# Patient Record
Sex: Female | Born: 1950 | Hispanic: No | Marital: Married | Smoking: Never smoker
Health system: Southern US, Community
[De-identification: ages and names within clinical notes are randomized; demographics above are authoritative.]

## PROBLEM LIST (undated history)

## (undated) DIAGNOSIS — M1990 Unspecified osteoarthritis, unspecified site: Secondary | ICD-10-CM

## (undated) DIAGNOSIS — H9190 Unspecified hearing loss, unspecified ear: Secondary | ICD-10-CM

## (undated) DIAGNOSIS — E785 Hyperlipidemia, unspecified: Secondary | ICD-10-CM

## (undated) DIAGNOSIS — M719 Bursopathy, unspecified: Secondary | ICD-10-CM

## (undated) HISTORY — DX: Hyperlipidemia, unspecified: E78.5

## (undated) HISTORY — PX: TUBAL LIGATION: SHX77

## (undated) HISTORY — PX: BREAST CYST ASPIRATION: SHX578

## (undated) HISTORY — DX: Bursopathy, unspecified: M71.9

## (undated) HISTORY — PX: TONSILLECTOMY: SUR1361

---

## 2011-06-16 ENCOUNTER — Ambulatory Visit (INDEPENDENT_AMBULATORY_CARE_PROVIDER_SITE_OTHER): Payer: PRIVATE HEALTH INSURANCE | Admitting: Family Medicine

## 2011-06-16 ENCOUNTER — Encounter: Payer: Self-pay | Admitting: Family Medicine

## 2011-06-16 VITALS — BP 130/80 | HR 64 | Temp 97.4°F | Ht 63.75 in | Wt 185.0 lb

## 2011-06-16 DIAGNOSIS — J309 Allergic rhinitis, unspecified: Secondary | ICD-10-CM | POA: Insufficient documentation

## 2011-06-16 DIAGNOSIS — Z2911 Encounter for prophylactic immunotherapy for respiratory syncytial virus (RSV): Secondary | ICD-10-CM

## 2011-06-16 DIAGNOSIS — Z1211 Encounter for screening for malignant neoplasm of colon: Secondary | ICD-10-CM

## 2011-06-16 DIAGNOSIS — Z1231 Encounter for screening mammogram for malignant neoplasm of breast: Secondary | ICD-10-CM

## 2011-06-16 DIAGNOSIS — Z0000 Encounter for general adult medical examination without abnormal findings: Secondary | ICD-10-CM

## 2011-06-16 DIAGNOSIS — Z23 Encounter for immunization: Secondary | ICD-10-CM

## 2011-06-16 DIAGNOSIS — Z136 Encounter for screening for cardiovascular disorders: Secondary | ICD-10-CM

## 2011-06-16 NOTE — Progress Notes (Signed)
Subjective:    Patient ID: Denise Parker, female    DOB: 04-19-1950, 61 y.o.   MRN: [id number]  HPI  61 yo here to establish care and for CPX.  Allergic rhinitis- two weeks ago, severe congestion, nasal drainage.  [facility], diagnosed with [facility] infection.  Pain improved but still had congestion and drainage.  Started on Flonase, Zyrtec and Sudafed by PMD in [place].  Feels better.  Well woman- G1P1, last pap smear was 06/2010 and normal.  Due for mammogram next month. Has never had a Zostavax. Colonoscopy approx 5 years ago (awaiting medical records).  Patient Active Problem List  Diagnoses  . Routine general medical examination at a health care facility  . Allergic rhinitis   Past Medical History  Diagnosis Date  . HLD (hyperlipidemia)    Past Surgical History  Procedure Date  . Breast biopsy   . Tonsillectomy    History  Substance Use Topics  . Smoking status: Never Smoker   . Smokeless tobacco: Not on file  . Alcohol Use: Not on file   Family History  Problem Relation Age of Onset  . Heart disease Mother   . Heart disease Father    No Known Allergies Current Outpatient Prescriptions on File Prior to Visit  Medication Sig Dispense Refill  . CALCIUM-VITAMIN D PO Take 600 mg of calcium and 800 units of vitamin D daily      . fluticasone (FLONASE) 50 MCG/ACT nasal spray Place 2 sprays into the nose daily.       The PMH, PSH, Social History, Family History, Medications, and allergies have been reviewed in [facility], and have been updated if relevant.   Review of Systems See HPI Patient reports no  vision/ hearing changes,anorexia, weight change, fever ,adenopathy, persistant / recurrent hoarseness, swallowing issues, chest pain, edema,persistant / recurrent cough, hemoptysis, dyspnea(rest, exertional, paroxysmal nocturnal), gastrointestinal  bleeding (melena, rectal bleeding), abdominal pain, excessive heart burn, GU symptoms(dysuria, hematuria, pyuria,  voiding/incontinence  Issues) syncope, focal weakness, severe memory loss, concerning skin lesions, depression, anxiety, abnormal bruising/bleeding, major joint swelling, breast masses or abnormal vaginal bleeding.       Objective:   Physical Exam BP 130/80  Pulse 64  Temp(Src) 97.4 F (36.3 C) (Oral)  Ht 5' 3.75" (1.619 m)  Wt 185 lb (83.915 kg)  BMI 32.00 kg/m2  General:  Well-developed,well-nourished,in no acute distress; alert,appropriate and cooperative throughout examination Head:  normocephalic and atraumatic.   Eyes:  vision grossly intact, pupils equal, pupils round, and pupils reactive to light.   Ears:  R ear normal and L ear normal.   Nose:  no external deformity.   Mouth:  good dentition.   Lungs:  Normal respiratory effort, chest expands symmetrically. Lungs are clear to auscultation, no crackles or wheezes. Heart:  Normal rate and regular rhythm. S1 and S2 normal without gallop, murmur, click, rub or other extra sounds. Abdomen:  Bowel sounds positive,abdomen soft and non-tender without masses, organomegaly or hernias noted. Msk:  No deformity or scoliosis noted of thoracic or lumbar spine.   Extremities:  No clubbing, cyanosis, edema, or deformity noted with normal full range of motion of all joints.   Neurologic:  alert & oriented X3 and gait normal.   Skin:  Intact without suspicious lesions or rashes Cervical Nodes:  No lymphadenopathy noted Axillary Nodes:  No palpable lymphadenopathy Psych:  Cognition and judgment appear intact. Alert and cooperative with normal attention span and concentration. No apparent delusions, illusions, hallucinations  Assessment & Plan:   1. Routine general medical examination at a health care facility  Reviewed preventive care protocols, scheduled due services, and updated immunizations Discussed nutrition, exercise, diet, and healthy lifestyle.  Comprehensive metabolic panel Lipid Panel MM Digital Screening Fecal occult  blood, imunochemical Zostavax  2. Allergic rhinitis   Improving.  Continue current meds.

## 2011-06-16 NOTE — Patient Instructions (Addendum)
It was very nice to meet you. Please stop by to see Denise Parker on your way out (after you go to the lab) to set up your mammogram. Please continue using your flonase, zyrtec and sudafed (as needed) until your symptoms improve.

## 2011-06-16 NOTE — Progress Notes (Signed)
Addended by: Eliezer Bottom on: 06/16/2011 02:57 PM   Modules accepted: Orders

## 2011-06-17 LAB — COMPREHENSIVE METABOLIC PANEL
ALT: 20 U/L (ref 0–35)
Albumin: 4.1 g/dL (ref 3.5–5.2)
Alkaline Phosphatase: 55 U/L (ref 39–117)
CO2: 27 mEq/L (ref 19–32)
Glucose, Bld: 89 mg/dL (ref 70–99)
Potassium: 3.7 mEq/L (ref 3.5–5.1)
Sodium: 141 mEq/L (ref 135–145)
Total Bilirubin: 0.4 mg/dL (ref 0.3–1.2)
Total Protein: 7.1 g/dL (ref 6.0–8.3)

## 2011-06-17 LAB — LIPID PANEL
Cholesterol: 247 mg/dL — ABNORMAL HIGH (ref 0–200)
Total CHOL/HDL Ratio: 3
VLDL: 10.6 mg/dL (ref 0.0–40.0)

## 2011-07-02 ENCOUNTER — Other Ambulatory Visit: Payer: PRIVATE HEALTH INSURANCE

## 2011-07-02 DIAGNOSIS — Z1211 Encounter for screening for malignant neoplasm of colon: Secondary | ICD-10-CM

## 2011-07-02 LAB — FECAL OCCULT BLOOD, IMMUNOCHEMICAL: Fecal Occult Bld: NEGATIVE

## 2011-07-03 ENCOUNTER — Encounter: Payer: Self-pay | Admitting: Family Medicine

## 2011-07-03 ENCOUNTER — Encounter: Payer: Self-pay | Admitting: *Deleted

## 2011-08-11 ENCOUNTER — Ambulatory Visit: Payer: Self-pay | Admitting: Family Medicine

## 2011-08-12 ENCOUNTER — Encounter: Payer: Self-pay | Admitting: Family Medicine

## 2011-08-13 ENCOUNTER — Encounter: Payer: Self-pay | Admitting: Family Medicine

## 2011-08-13 ENCOUNTER — Encounter: Payer: Self-pay | Admitting: *Deleted

## 2011-08-13 LAB — HM MAMMOGRAPHY: HM Mammogram: NORMAL

## 2011-08-25 ENCOUNTER — Ambulatory Visit (INDEPENDENT_AMBULATORY_CARE_PROVIDER_SITE_OTHER): Payer: PRIVATE HEALTH INSURANCE | Admitting: Family Medicine

## 2011-08-25 ENCOUNTER — Encounter: Payer: Self-pay | Admitting: Family Medicine

## 2011-08-25 VITALS — BP 118/68 | HR 64 | Temp 97.5°F | Wt 185.5 lb

## 2011-08-25 DIAGNOSIS — L255 Unspecified contact dermatitis due to plants, except food: Secondary | ICD-10-CM

## 2011-08-25 DIAGNOSIS — L237 Allergic contact dermatitis due to plants, except food: Secondary | ICD-10-CM

## 2011-08-25 MED ORDER — DIPHENHYDRAMINE HCL 25 MG PO CAPS: 25.0000 mg | ORAL_CAPSULE | ORAL | Status: DC | PRN

## 2011-08-25 MED ORDER — DEXAMETHASONE SODIUM PHOSPHATE 10 MG/ML IJ SOLN
10.0000 mg | Freq: Once | INTRAMUSCULAR | Status: AC
  Administered 2011-08-25: 10 mg via INTRAMUSCULAR

## 2011-08-25 MED ORDER — PREDNISONE 20 MG PO TABS: ORAL_TABLET | ORAL | Status: DC

## 2011-08-25 NOTE — Patient Instructions (Addendum)
Poison Ivy Poison ivy is a inflammation of the skin (contact dermatitis) caused by touching the allergens on the leaves of the ivy plant following previous exposure to the plant. The rash usually appears 48 hours after exposure. The rash is usually bumps (papules) or blisters (vesicles) in a linear pattern. Depending on your own sensitivity, the rash may simply cause redness and itching, or it may also progress to blisters which may break open. These must be well cared for to prevent secondary bacterial (germ) infection, followed by scarring. Keep any open areas dry, clean, dressed, and covered with an antibacterial ointment if needed. The eyes may also get puffy. The puffiness is worst in the morning and gets better as the day progresses. This dermatitis usually heals without scarring, within 2 to 3 weeks without treatment. HOME CARE INSTRUCTIONS  Thoroughly wash with soap and water as soon as you have been exposed to poison ivy. You have about one half hour to remove the plant resin before it will cause the rash. This washing will destroy the oil or antigen on the skin that is causing, or will cause, the rash. Be sure to wash under your fingernails as any plant resin there will continue to spread the rash. Do not rub skin vigorously when washing affected area. Poison ivy cannot spread if no oil from the plant remains on your body. A rash that has progressed to weeping sores will not spread the rash unless you have not washed thoroughly. It is also important to wash any clothes you have been wearing as these may carry active allergens. The rash will return if you wear the unwashed clothing, even several days later. Avoidance of the plant in the future is the best measure. Poison ivy plant can be recognized by the number of leaves. Generally, poison ivy has three leaves with flowering branches on a single stem. Diphenhydramine may be purchased over the counter and used as needed for itching. Do not drive with  this medication if it makes you drowsy.Ask your caregiver about medication for children. SEEK MEDICAL CARE IF:  Open sores develop.   Redness spreads beyond area of rash.   You notice purulent (pus-like) discharge.   You have increased pain.   Other signs of infection develop (such as fever).  Document Released: 02/08/2000 Document Revised: 01/30/2011 Document Reviewed: 12/27/2008 [employer] Patient Information 2012 [employer]. 

## 2011-08-25 NOTE — Progress Notes (Deleted)
  Subjective:    Patient ID: Denise Parker, female    DOB: 05/08/1950, 61 y.o.   MRN: [id number]  HPI    Review of Systems     Objective:   Physical Exam        Assessment & Plan:

## 2011-08-25 NOTE — Progress Notes (Signed)
  Subjective:     Denise Parker is a 62 y.o. female who complains of a rash. Symptoms began 2 days ago. Patient describes the rash as maculopapular. Characteristics of rash and associated history: Is rash pruritic?  yes. Patient's previous dermatologic history includes contact dermatitis: plants poison ivy/oak.Medications currently using: moisturizing cream. Environmental exposures or allergies: poison ivy  Patient Active Problem List  Diagnosis  . Routine general medical examination at a health care facility  . Allergic rhinitis   Past Medical History  Diagnosis Date  . HLD (hyperlipidemia)    Past Surgical History  Procedure Date  . Breast biopsy   . Tonsillectomy    History  Substance Use Topics  . Smoking status: Never Smoker   . Smokeless tobacco: Not on file  . Alcohol Use: Yes     Wine-regular   Family History  Problem Relation Age of Onset  . Heart disease Mother   . Heart disease Father    No Known Allergies Current Outpatient Prescriptions on File Prior to Visit  Medication Sig Dispense Refill  . CALCIUM-VITAMIN D PO Take 600 mg of calcium and 800 units of vitamin D daily      . CHONDROITIN SULFATE A PO Take 1200 mg's by mouth daily      . CRANBERRY-VITAMIN C PO Take by mouth daily.      . Glucosamine-Chondroit-Vit C-Mn (GLUCOSAMINE 1500 COMPLEX PO) Take by mouth daily.      . fluticasone (FLONASE) 50 MCG/ACT nasal spray Place 2 sprays into the nose daily.       No current facility-administered medications on file prior to visit.   The PMH, PSH, Social History, Family History, Medications, and allergies have been reviewed in [facility], and have been updated if relevant.   Review of Systems See HPI No fevers No pus draining from lesions None of lesions are tender.  Objective:    BP 118/68  Pulse 64  Temp 97.5 F (36.4 C) (Oral)  Wt 185 lb 8 oz (84.142 kg) Physical Exam  General:  alert and cooperative  HEENT:  PERRLA  Lymph Nodes:  Cervical,  supraclavicular, and axillary nodes normal.  Lungs:  clear to auscultation bilaterally and normal percussion bilaterally  Heart:  regular rate and rhythm, S1, S2 normal, no murmur, click, rub or gallop  Extremities:  extremities normal, atraumatic, no cyanosis or edema  Skin:   Rash located on the arms, legs.   Shape:   raised   Consistency:   nontender  Color:   hyperpigmented  Type: maculopapular eruption - arm(s) bilateral, legs bilateral  Size: severalcm    The remainder of the skin exam:  color normal, vascularity normal, no rashes or suspicious lesions, no evidence of bleeding or bruising, mobility and turgor normal     Assessment:    contact dermatitis: plants poison ivy    Plan:    1.  steroids: decadron IM in office today, given steroid taper to start on Thursday if symptoms have persisted 2.  Written patient instruction given. 3. Follow up as needed for acute illness

## 2012-09-27 ENCOUNTER — Encounter: Payer: Self-pay | Admitting: Family Medicine

## 2012-09-27 ENCOUNTER — Other Ambulatory Visit (HOSPITAL_COMMUNITY)
Admission: RE | Admit: 2012-09-27 | Discharge: 2012-09-27 | Disposition: A | Discharge status: Home / Self Care | Payer: Managed Care, Other (non HMO) | Source: Ambulatory Visit | Attending: Family Medicine | Admitting: Family Medicine

## 2012-09-27 ENCOUNTER — Ambulatory Visit (INDEPENDENT_AMBULATORY_CARE_PROVIDER_SITE_OTHER): Payer: Managed Care, Other (non HMO) | Admitting: Family Medicine

## 2012-09-27 VITALS — BP 110/70 | HR 68 | Temp 97.6°F | Ht 63.75 in | Wt 183.0 lb

## 2012-09-27 DIAGNOSIS — Z136 Encounter for screening for cardiovascular disorders: Secondary | ICD-10-CM

## 2012-09-27 DIAGNOSIS — Z01419 Encounter for gynecological examination (general) (routine) without abnormal findings: Secondary | ICD-10-CM | POA: Insufficient documentation

## 2012-09-27 DIAGNOSIS — M21611 Bunion of right foot: Secondary | ICD-10-CM

## 2012-09-27 DIAGNOSIS — M21619 Bunion of unspecified foot: Secondary | ICD-10-CM

## 2012-09-27 DIAGNOSIS — M7750 Other enthesopathy of unspecified foot: Secondary | ICD-10-CM

## 2012-09-27 DIAGNOSIS — Z1151 Encounter for screening for human papillomavirus (HPV): Secondary | ICD-10-CM | POA: Insufficient documentation

## 2012-09-27 DIAGNOSIS — Z1231 Encounter for screening mammogram for malignant neoplasm of breast: Secondary | ICD-10-CM

## 2012-09-27 DIAGNOSIS — M7741 Metatarsalgia, right foot: Secondary | ICD-10-CM

## 2012-09-27 DIAGNOSIS — M7740 Metatarsalgia, unspecified foot: Secondary | ICD-10-CM | POA: Insufficient documentation

## 2012-09-27 DIAGNOSIS — Z1211 Encounter for screening for malignant neoplasm of colon: Secondary | ICD-10-CM

## 2012-09-27 DIAGNOSIS — J309 Allergic rhinitis, unspecified: Secondary | ICD-10-CM

## 2012-09-27 DIAGNOSIS — Z0000 Encounter for general adult medical examination without abnormal findings: Secondary | ICD-10-CM

## 2012-09-27 DIAGNOSIS — R7989 Other specified abnormal findings of blood chemistry: Secondary | ICD-10-CM

## 2012-09-27 HISTORY — DX: Metatarsalgia, unspecified foot: M77.40

## 2012-09-27 LAB — COMPREHENSIVE METABOLIC PANEL
ALT: 13 U/L (ref 0–35)
Alkaline Phosphatase: 48 U/L (ref 39–117)
CO2: 27 mEq/L (ref 19–32)
Creatinine, Ser: 0.7 mg/dL (ref 0.4–1.2)
GFR: 85.39 mL/min (ref >60.00)
Glucose, Bld: 89 mg/dL (ref 70–99)
Sodium: 141 mEq/L (ref 135–145)
Total Bilirubin: 0.6 mg/dL (ref 0.3–1.2)
Total Protein: 7.1 g/dL (ref 6.0–8.3)

## 2012-09-27 LAB — LIPID PANEL: VLDL: 10 mg/dL (ref 0.0–40.0)

## 2012-09-27 NOTE — Addendum Note (Signed)
Addended by: Alvina Chou on: 09/27/2012 11:24 AM   Modules accepted: Orders

## 2012-09-27 NOTE — Patient Instructions (Addendum)
Good to see you. Please call to set up your mammogram.  Please stop by to see Shirlee Limerick on your way out to set up your podiatry referral.  We will call you with your lab results and you may also view them online.

## 2012-09-27 NOTE — Progress Notes (Signed)
Subjective:    Patient ID: Denise Parker, female    DOB: February 18, 1951, 62 y.o.   MRN: [id number]  HPI  62 yo pleasant female here for CPX.  Well woman- G1P1, last pap smear was was a few years ago, normal.  Due for mammogram. Mom had cervical CA, sister had breast CA in her 72s.  Colonoscopy UTD.  Denies blood in her stool or changes in her bowel habits.  Right foot pain- had right fib fx last year and also has a large bunion.  Since then, has pain on side and bottom of her foot.  Patient Active Problem List   Diagnosis Date Noted  . Routine general medical examination at a health care facility 06/16/2011  . Allergic rhinitis 06/16/2011   Past Medical History  Diagnosis Date  . HLD (hyperlipidemia)    Past Surgical History  Procedure Laterality Date  . Breast biopsy    . Tonsillectomy     History  Substance Use Topics  . Smoking status: Never Smoker   . Smokeless tobacco: Not on file  . Alcohol Use: Yes     Comment: Wine-regular   Family History  Problem Relation Age of Onset  . Heart disease Mother   . Heart disease Father    No Known Allergies Current Outpatient Prescriptions on File Prior to Visit  Medication Sig Dispense Refill  . CALCIUM-VITAMIN D PO Take 600 mg of calcium and 800 units of vitamin D daily      . CHONDROITIN SULFATE A PO Take 1200 mg's by mouth daily      . CRANBERRY-VITAMIN C PO Take by mouth daily.      . diphenhydrAMINE (BENADRYL) 25 mg capsule Take 1 capsule (25 mg total) by mouth every 4 (four) hours as needed for itching.  30 capsule  0  . fluticasone (FLONASE) 50 MCG/ACT nasal spray Place 2 sprays into the nose daily.      . Glucosamine-Chondroit-Vit C-Mn (GLUCOSAMINE 1500 COMPLEX PO) Take by mouth daily.       No current facility-administered medications on file prior to visit.   The PMH, PSH, Social History, Family History, Medications, and allergies have been reviewed in [facility], and have been updated if relevant.   Review of  Systems See HPI Patient reports no  vision/ hearing changes,anorexia, weight change, fever ,adenopathy, persistant / recurrent hoarseness, swallowing issues, chest pain, edema,persistant / recurrent cough, hemoptysis, dyspnea(rest, exertional, paroxysmal nocturnal), gastrointestinal  bleeding (melena, rectal bleeding), abdominal pain, excessive heart burn, GU symptoms(dysuria, hematuria, pyuria, voiding/incontinence  Issues) syncope, focal weakness, severe memory loss, concerning skin lesions, depression, anxiety, abnormal bruising/bleeding, major joint swelling, breast masses or abnormal vaginal bleeding.       Objective:   Physical Exam BP 110/70  Pulse 68  Temp(Src) 97.6 F (36.4 C)  Ht 5' 3.75" (1.619 m)  Wt 183 lb (83.008 kg)  BMI 31.67 kg/m2   General:  Well-developed,well-nourished,in no acute distress; alert,appropriate and cooperative throughout examination Head:  normocephalic and atraumatic.   Eyes:  vision grossly intact, pupils equal, pupils round, and pupils reactive to light.   Ears:  R ear normal and L ear normal.   Nose:  no external deformity.   Mouth:  good dentition.   Neck:  No deformities, masses, or tenderness noted. Breasts:  No mass, nodules, thickening, tenderness, bulging, retraction, inflamation, nipple discharge or skin changes noted.   Lungs:  Normal respiratory effort, chest expands symmetrically. Lungs are clear to auscultation, no crackles or wheezes. Heart:  Normal rate and regular rhythm. S1 and S2 normal without gallop, murmur, click, rub or other extra sounds. Abdomen:  Bowel sounds positive,abdomen soft and non-tender without masses, organomegaly or hernias noted. Rectal:  no external abnormalities.   Genitalia:  Pelvic Exam:        External: normal female genitalia without lesions or masses        Vagina: normal without lesions or masses        Cervix: normal without lesions or masses        Adnexa: normal bimanual exam without masses or  fullness        Uterus: normal by palpation        Pap smear: performed Msk:  No deformity or scoliosis noted of thoracic or lumbar spine.   Right foot, large bunion, 2nd toe curved likely due to bunion Extremities:  No clubbing, cyanosis, edema, or deformity noted with normal full range of motion of all joints.   Neurologic:  alert & oriented X3 and gait normal.   Skin:  Intact without suspicious lesions or rashes Cervical Nodes:  No lymphadenopathy noted Axillary Nodes:  No palpable lymphadenopathy Psych:  Cognition and judgment appear intact. Alert and cooperative with normal attention span and concentration. No apparent delusions, illusions, hallucinations     Assessment & Plan:    1. Routine general medical examination at a health care facility Reviewed preventive care protocols, scheduled due services, and updated immunizations Discussed nutrition, exercise, diet, and healthy lifestyle.  - Comprehensive metabolic panel - Cytology - PAP  2. Allergic rhinitis Stable, no current issues.  3. Metatarsalgia, right New- likely due to bunion, ? Also morton's neuroma - Ambulatory referral to Podiatry  4. Bunion of great toe of right foot  - Ambulatory referral to Podiatry  5. Special screening for malignant neoplasms, colon  - Fecal occult blood, imunochemical  6. Screening for ischemic heart disease  - Lipid Panel  7. Other screening mammogram  - MM Digital Screening; Future

## 2012-09-30 ENCOUNTER — Other Ambulatory Visit (INDEPENDENT_AMBULATORY_CARE_PROVIDER_SITE_OTHER): Payer: Managed Care, Other (non HMO)

## 2012-09-30 DIAGNOSIS — Z1211 Encounter for screening for malignant neoplasm of colon: Secondary | ICD-10-CM

## 2012-09-30 LAB — FECAL OCCULT BLOOD, IMMUNOCHEMICAL: Fecal Occult Bld: NEGATIVE

## 2012-10-01 ENCOUNTER — Encounter: Payer: Self-pay | Admitting: Family Medicine

## 2012-10-01 ENCOUNTER — Encounter: Payer: Self-pay | Admitting: *Deleted

## 2012-10-01 LAB — HM PAP SMEAR: HM Pap smear: NORMAL

## 2012-10-01 LAB — FECAL OCCULT BLOOD, GUAIAC: Fecal Occult Blood: NEGATIVE

## 2012-10-07 ENCOUNTER — Ambulatory Visit: Payer: Self-pay | Admitting: Family Medicine

## 2012-10-08 ENCOUNTER — Encounter: Payer: Self-pay | Admitting: Family Medicine

## 2012-10-11 ENCOUNTER — Encounter: Payer: Self-pay | Admitting: Family Medicine

## 2012-10-11 ENCOUNTER — Encounter: Payer: Self-pay | Admitting: *Deleted

## 2012-10-11 LAB — HM MAMMOGRAPHY: HM Mammogram: NORMAL

## 2013-04-13 ENCOUNTER — Ambulatory Visit (INDEPENDENT_AMBULATORY_CARE_PROVIDER_SITE_OTHER): Payer: Managed Care, Other (non HMO) | Admitting: Family Medicine

## 2013-04-13 ENCOUNTER — Encounter: Payer: Self-pay | Admitting: Family Medicine

## 2013-04-13 VITALS — BP 120/70 | HR 63 | Temp 97.4°F | Ht 63.5 in | Wt 190.2 lb

## 2013-04-13 DIAGNOSIS — B029 Zoster without complications: Secondary | ICD-10-CM

## 2013-04-13 HISTORY — DX: Zoster without complications: B02.9

## 2013-04-13 NOTE — Assessment & Plan Note (Signed)
Discussed course and tx of shingles. Agree with Valtrex- advised to finish course of tx. She is not having pain which is great- no pain rx given today. Call or return to clinic prn if these symptoms worsen or fail to improve as anticipated. The patient indicates understanding of these issues and agrees with the plan.

## 2013-04-13 NOTE — Progress Notes (Signed)
Pre-visit discussion using our clinic review tool. No additional management support is needed unless otherwise documented below in the visit note.  

## 2013-04-13 NOTE — Progress Notes (Signed)
   Subjective:   Patient ID: Denise Parker, female    DOB: 1950-05-15, 63 y.o.   MRN: [id number]  Denise Parker is a pleasant 63 y.o. year old female who presents to clinic today with Rash  on 04/13/2013  HPI: Lives and works in [place] for much of the year.  Felt something itching and burning on her neck 4 days ago.  Son looked at it and said she had four little bumps.  Went to her PMD in [place] and was diagnosed with Shingles.  Started on Valtrex 1 gm three times daily x 7 days.  Rash already feels better.  Has never had any pain.  Here today to confirm this diagnosis and to discuss further tx if necessary.  Patient Active Problem List   Diagnosis Date Noted  . Herpes zoster 04/13/2013  . Metatarsalgia 09/27/2012  . Bunion of great toe of right foot 09/27/2012  . Routine general medical examination at a health care facility 06/16/2011  . Allergic rhinitis 06/16/2011   Past Medical History  Diagnosis Date  . HLD (hyperlipidemia)    Past Surgical History  Procedure Laterality Date  . Breast biopsy    . Tonsillectomy     History  Substance Use Topics  . Smoking status: Never Smoker   . Smokeless tobacco: Not on file  . Alcohol Use: Yes     Comment: Wine-regular   Family History  Problem Relation Age of Onset  . Heart disease Mother   . Heart disease Father    No Known Allergies Current Outpatient Prescriptions on File Prior to Visit  Medication Sig Dispense Refill  . CALCIUM-VITAMIN D PO Take 600 mg of calcium and 800 units of vitamin D daily      . CHONDROITIN SULFATE A PO Take 1200 mg's by mouth daily      . CRANBERRY-VITAMIN C PO Take by mouth daily.      . Glucosamine-Chondroit-Vit C-Mn (GLUCOSAMINE 1500 COMPLEX PO) Take by mouth daily.      . vitamin E 400 UNIT capsule Take 400 Units by mouth daily.       No current facility-administered medications on file prior to visit.   The PMH, PSH, Social History, Family History, Medications, and allergies [facility]  [facility], and have been updated if relevant.   Review of Systems See HPI    Objective:    BP 120/70  Pulse 63  Temp(Src) 97.4 F (36.3 C) (Oral)  Ht 5' 3.5" (1.613 m)  Wt 190 lb 4 oz (86.297 kg)  BMI 33.17 kg/m2  SpO2 98%   Physical Exam  Nursing note and vitals reviewed. Constitutional: She appears well-developed and well-nourished. No distress.  Skin:             Assessment & Plan:   Herpes zoster No Follow-up on file.

## 2013-12-20 ENCOUNTER — Encounter: Payer: Managed Care, Other (non HMO) | Admitting: Family Medicine

## 2013-12-28 ENCOUNTER — Encounter: Payer: Managed Care, Other (non HMO) | Admitting: Family Medicine

## 2014-01-24 ENCOUNTER — Ambulatory Visit (INDEPENDENT_AMBULATORY_CARE_PROVIDER_SITE_OTHER): Payer: Medicare Other | Admitting: Family Medicine

## 2014-01-24 ENCOUNTER — Other Ambulatory Visit (HOSPITAL_COMMUNITY)
Admission: RE | Admit: 2014-01-24 | Discharge: 2014-01-24 | Disposition: A | Discharge status: Home / Self Care | Payer: Managed Care, Other (non HMO) | Source: Ambulatory Visit | Attending: Family Medicine | Admitting: Family Medicine

## 2014-01-24 ENCOUNTER — Encounter: Payer: Self-pay | Admitting: Family Medicine

## 2014-01-24 VITALS — BP 110/60 | HR 83 | Temp 97.9°F | Ht 64.0 in | Wt 195.0 lb

## 2014-01-24 DIAGNOSIS — Z1151 Encounter for screening for human papillomavirus (HPV): Secondary | ICD-10-CM | POA: Diagnosis not present

## 2014-01-24 DIAGNOSIS — Z0000 Encounter for general adult medical examination without abnormal findings: Secondary | ICD-10-CM | POA: Diagnosis not present

## 2014-01-24 DIAGNOSIS — Z1231 Encounter for screening mammogram for malignant neoplasm of breast: Secondary | ICD-10-CM

## 2014-01-24 DIAGNOSIS — Z79899 Other long term (current) drug therapy: Secondary | ICD-10-CM

## 2014-01-24 DIAGNOSIS — E785 Hyperlipidemia, unspecified: Secondary | ICD-10-CM | POA: Diagnosis not present

## 2014-01-24 DIAGNOSIS — Z01419 Encounter for gynecological examination (general) (routine) without abnormal findings: Secondary | ICD-10-CM | POA: Insufficient documentation

## 2014-01-24 DIAGNOSIS — Z1239 Encounter for other screening for malignant neoplasm of breast: Secondary | ICD-10-CM

## 2014-01-24 DIAGNOSIS — Z23 Encounter for immunization: Secondary | ICD-10-CM | POA: Diagnosis not present

## 2014-01-24 LAB — CBC WITH DIFFERENTIAL/PLATELET
BASOS ABS: 0.1 10*3/uL (ref 0.0–0.1)
Basophils Relative: 0.9 % (ref 0.0–3.0)
EOS PCT: 1.5 % (ref 0.0–5.0)
Eosinophils Absolute: 0.1 10*3/uL (ref 0.0–0.7)
HEMATOCRIT: 40.5 % (ref 36.0–46.0)
HEMOGLOBIN: 13.1 g/dL (ref 12.0–15.0)
LYMPHS ABS: 2.2 10*3/uL (ref 0.7–4.0)
Lymphocytes Relative: 38.5 % (ref 12.0–46.0)
MCHC: 32.4 g/dL (ref 30.0–36.0)
MCV: 85.7 fl (ref 78.0–100.0)
MONOS PCT: 7.7 % (ref 3.0–12.0)
Monocytes Absolute: 0.4 10*3/uL (ref 0.1–1.0)
NEUTROS ABS: 2.9 10*3/uL (ref 1.4–7.7)
Neutrophils Relative %: 51.4 % (ref 43.0–77.0)
Platelets: 242 10*3/uL (ref 150.0–400.0)
RBC: 4.73 Mil/uL (ref 3.87–5.11)
RDW: 14.8 % (ref 11.5–15.5)
WBC: 5.6 10*3/uL (ref 4.0–10.5)

## 2014-01-24 LAB — TSH: TSH: 2.31 u[IU]/mL (ref 0.35–4.50)

## 2014-01-24 NOTE — Addendum Note (Signed)
Addended by: Desmond DikeKNIGHT, Arbell Wycoff H on: 01/24/2014 10:00 AM   Modules accepted: Orders

## 2014-01-24 NOTE — Progress Notes (Signed)
Subjective:    Patient ID: Denise Parker, female    DOB: [birth year], 63 y.o.   MRN: [id number]  HPI  63 yo pleasant female here for Welcome to Medicare Physical.  I have personally reviewed the Medicare Annual Wellness questionnaire and have noted 1. The patient's medical and social history 2. Their use of alcohol, tobacco or illicit drugs 3. Their current medications and supplements 4. The patient's functional ability including ADL's, fall risks, home safety risks and hearing or visual             impairment. 5. Diet and physical activities 6. Evidence for depression or mood disorders  End of life wishes discussed and updated in Social History.  The roster of all physicians providing medical care to patient - is listed in the Snapshot section of the chart.   Last pap smear - 09/27/12- done by me. No post menopausal bleeding. Mammogram 10/07/12. Mom had cervical CA, sister had breast CA in her 6660s. Eye exam 05/2013 Influenza vaccine 12/08/13  Colonoscopy UTD- done in [place], per pt, done in 2007.  Denies blood in her stool or changes in her bowel habits.  Lab Results  Component Value Date   CHOL 246* 09/27/2012   HDL 69.50 09/27/2012   LDLDIRECT 171.8 09/27/2012   TRIG 50.0 09/27/2012   CHOLHDL 4 09/27/2012   Lab Results  Component Value Date   NA 141 09/27/2012   K 3.8 09/27/2012   CL 105 09/27/2012   CO2 27 09/27/2012   Lab Results  Component Value Date   CREATININE 0.7 09/27/2012     Patient Active Problem List   Diagnosis Date Noted  . Welcome to Medicare preventive visit 01/24/2014  . Herpes zoster 04/13/2013  . Metatarsalgia 09/27/2012  . Bunion of great toe of right foot 09/27/2012  . Allergic rhinitis 06/16/2011   Past Medical History  Diagnosis Date  . HLD (hyperlipidemia)    Past Surgical History  Procedure Laterality Date  . Breast biopsy    . Tonsillectomy     History  Substance Use Topics  . Smoking status: Never Smoker   .  Smokeless tobacco: Not on file  . Alcohol Use: Yes     Comment: Wine-regular   Family History  Problem Relation Age of Onset  . Heart disease Mother   . Heart disease Father    No Known Allergies Current Outpatient Prescriptions on File Prior to Visit  Medication Sig Dispense Refill  . CALCIUM-VITAMIN D PO Take 600 mg of calcium and 800 units of vitamin D daily    . CHONDROITIN SULFATE A PO Take 1200 mg's by mouth daily    . CRANBERRY-VITAMIN C PO Take by mouth daily.    . Glucosamine-Chondroit-Vit C-Mn (GLUCOSAMINE 1500 COMPLEX PO) Take by mouth daily.    . valACYclovir (VALTREX) 1000 MG tablet Take 1,000 mg by mouth 4 (four) times daily.    . vitamin E 400 UNIT capsule Take 400 Units by mouth daily.     No current facility-administered medications on file prior to visit.   The PMH, PSH, Social History, Family History, Medications, and allergies [facility], and have been updated if relevant.   Review of Systems See HPI Patient reports no  vision/ hearing changes,anorexia, weight change, fever ,adenopathy, persistant / recurrent hoarseness, swallowing issues, chest pain, edema,persistant / recurrent cough, hemoptysis, dyspnea(rest, exertional, paroxysmal nocturnal), gastrointestinal  bleeding (melena, rectal bleeding), abdominal pain, excessive heart burn, GU symptoms(dysuria, hematuria, pyuria, voiding/incontinence  Issues)  syncope, focal weakness, severe memory loss, concerning skin lesions, depression, anxiety, abnormal bruising/bleeding, major joint swelling, breast masses or abnormal vaginal bleeding.       Objective:   Physical Exam BP 110/60 mmHg  Pulse 83  Temp(Src) 97.9 F (36.6 C) (Oral)  Ht 5\' 4"  (1.626 m)  Wt 195 lb (88.451 kg)  BMI 33.46 kg/m2  SpO2 98%   General:  Well-developed,well-nourished,in no acute distress; alert,appropriate and cooperative throughout examination Head:  normocephalic and atraumatic.   Eyes:  vision grossly intact, pupils  equal, pupils round, and pupils reactive to light.   Ears:  R ear normal and L ear normal.   Nose:  no external deformity.   Mouth:  good dentition.   Neck:  No deformities, masses, or tenderness noted. Breasts:  No mass, nodules, thickening, tenderness, bulging, retraction, inflamation, nipple discharge or skin changes noted.   Lungs:  Normal respiratory effort, chest expands symmetrically. Lungs are clear to auscultation, no crackles or wheezes. Heart:  Normal rate and regular rhythm. S1 and S2 normal without gallop, murmur, click, rub or other extra sounds. Abdomen:  Bowel sounds positive,abdomen soft and non-tender without masses, organomegaly or hernias noted. Rectal:  no external abnormalities.   Genitalia:  Pelvic Exam:        External: normal female genitalia without lesions or masses        Vagina: normal without lesions or masses        Cervix: normal without lesions or masses        Adnexa: normal bimanual exam without masses or fullness        Uterus: normal by palpation        Pap smear: performed Msk:  No deformity or scoliosis noted of thoracic or lumbar spine.   Extremities:  No clubbing, cyanosis, edema, or deformity noted with normal full range of motion of all joints.   Neurologic:  alert & oriented X3 and gait normal.   Skin:  Intact without suspicious lesions or rashes Cervical Nodes:  No lymphadenopathy noted Axillary Nodes:  No palpable lymphadenopathy Psych:  Cognition and judgment appear intact. Alert and cooperative with normal attention span and concentration. No apparent delusions, illusions, hallucinations      Assessment & Plan:

## 2014-01-24 NOTE — Patient Instructions (Signed)
Great to see you. Happy Holidays.  Please schedule your mammogram at your convenience.

## 2014-01-24 NOTE — Addendum Note (Signed)
Addended by: Dianne DunARON, Silvie Obremski M on: 01/24/2014 10:01 AM   Modules accepted: Orders, SmartSet

## 2014-01-24 NOTE — Addendum Note (Signed)
Addended by: Javonni Macke, TALIDianne DunA M on: 01/24/2014 09:55 AM   Modules accepted: Kipp BroodSmartSet

## 2014-01-24 NOTE — Assessment & Plan Note (Addendum)
The patients weight, height, BMI and visual acuity have been recorded in the chart I have made referrals, counseling and provided education to the patient based review of the above and I have provided the pt with a written personalized care plan for preventive services.  Pap smear done today given family h/o cervical CA.  Prevnar 13 given to pt today. Mammogram ordered.  EKG NSR (mild bradycardia)  Orders Placed This Encounter  Procedures  . MM Digital Screening  . Pneumococcal conjugate vaccine 13-valent IM  . CBC with Differential  . Comprehensive metabolic panel  . Lipid panel  . TSH  . EKG 12-Lead

## 2014-01-24 NOTE — Progress Notes (Signed)
Pre visit review using our clinic review tool, if applicable. No additional management support is needed unless otherwise documented below in the visit note. 

## 2014-01-25 LAB — LIPID PANEL
CHOLESTEROL: 286 mg/dL — AB (ref 0–200)
HDL: 68.5 mg/dL (ref >39.00)
LDL Cholesterol: 201 mg/dL — ABNORMAL HIGH (ref 0–99)
NonHDL: 217.5
Total CHOL/HDL Ratio: 4
Triglycerides: 85 mg/dL (ref 0.0–149.0)
VLDL: 17 mg/dL (ref 0.0–40.0)

## 2014-01-25 LAB — COMPREHENSIVE METABOLIC PANEL
ALT: 29 U/L (ref 0–35)
AST: 28 U/L (ref 0–37)
Albumin: 4.1 g/dL (ref 3.5–5.2)
Alkaline Phosphatase: 55 U/L (ref 39–117)
BILIRUBIN TOTAL: 0.8 mg/dL (ref 0.2–1.2)
BUN: 14 mg/dL (ref 6–23)
CO2: 24 mEq/L (ref 19–32)
CREATININE: 0.7 mg/dL (ref 0.4–1.2)
Calcium: 8.7 mg/dL (ref 8.4–10.5)
Chloride: 104 mEq/L (ref 96–112)
GFR: 86.4 mL/min (ref >60.00)
GLUCOSE: 85 mg/dL (ref 70–99)
Potassium: 4 mEq/L (ref 3.5–5.1)
Sodium: 137 mEq/L (ref 135–145)
Total Protein: 7 g/dL (ref 6.0–8.3)

## 2014-01-25 LAB — CYTOLOGY - PAP

## 2014-01-26 ENCOUNTER — Other Ambulatory Visit: Payer: Self-pay | Admitting: Family Medicine

## 2014-01-26 MED ORDER — ATORVASTATIN CALCIUM 20 MG PO TABS
20.0000 mg | ORAL_TABLET | Freq: Every day | ORAL | Status: DC
Start: 2014-01-26 — End: 2015-01-01

## 2014-02-21 ENCOUNTER — Ambulatory Visit: Payer: Self-pay | Admitting: Family Medicine

## 2014-02-21 DIAGNOSIS — Z1231 Encounter for screening mammogram for malignant neoplasm of breast: Secondary | ICD-10-CM | POA: Diagnosis not present

## 2014-02-22 ENCOUNTER — Other Ambulatory Visit: Payer: Self-pay | Admitting: Internal Medicine

## 2014-02-22 ENCOUNTER — Encounter: Payer: Self-pay | Admitting: Family Medicine

## 2014-02-22 DIAGNOSIS — R928 Other abnormal and inconclusive findings on diagnostic imaging of breast: Secondary | ICD-10-CM

## 2014-03-14 ENCOUNTER — Ambulatory Visit: Payer: Self-pay | Admitting: Family Medicine

## 2014-03-14 DIAGNOSIS — R928 Other abnormal and inconclusive findings on diagnostic imaging of breast: Secondary | ICD-10-CM | POA: Diagnosis not present

## 2014-03-15 ENCOUNTER — Encounter: Payer: Self-pay | Admitting: Family Medicine

## 2014-03-21 ENCOUNTER — Ambulatory Visit (INDEPENDENT_AMBULATORY_CARE_PROVIDER_SITE_OTHER)
Admission: RE | Admit: 2014-03-21 | Discharge: 2014-03-21 | Disposition: A | Discharge status: Home / Self Care | Payer: Medicare Other | Source: Ambulatory Visit | Attending: Family Medicine | Admitting: Family Medicine

## 2014-03-21 ENCOUNTER — Encounter: Payer: Self-pay | Admitting: Family Medicine

## 2014-03-21 ENCOUNTER — Ambulatory Visit (INDEPENDENT_AMBULATORY_CARE_PROVIDER_SITE_OTHER): Payer: Medicare Other | Admitting: Family Medicine

## 2014-03-21 VITALS — BP 108/64 | HR 64 | Temp 97.7°F | Wt 199.5 lb

## 2014-03-21 DIAGNOSIS — M79672 Pain in left foot: Secondary | ICD-10-CM

## 2014-03-21 NOTE — Patient Instructions (Signed)
Heel Spur A heel spur is a hook of bone that can form on the calcaneus (the heel bone and the largest bone of the foot). Heel spurs are often associated with plantar fasciitis and usually come in people who have had the problem for an extended period of time. The cause of the relationship is unknown. The pain associated with them is thought to be caused by an inflammation (soreness and redness) of the plantar fascia rather than the spur itself. The plantar fascia is a thick fibrous like tissue that runs from the calcaneus (heel bone) to the ball of the foot. This strong, tight tissue helps maintain the arch of your foot. It helps distribute the weight across your foot as you walk or run. Stresses placed on the plantar fascia can be tremendous. When it is inflamed normal activities become painful. Pain is worse in the morning after sleeping. After sleeping the plantar fascia is tight. The first movements stretch the fascia and this causes pain. As the tendon loosens, the pain usually gets better. It often returns with too much standing or walking.  About 70% of patients with plantar fasciitis have a heel spur. About half of people without foot pain also have heel spurs. DIAGNOSIS  The diagnosis of a heel spur is made by X-ray. The X-ray shows a hook of bone protruding from the bottom of the calcaneus at the point where the plantar fascia is attached to the heel bone.  TREATMENT  It is necessary to find out what is causing the stretching of the plantar fascia. If the cause is over-pronation (flat feet), orthotics and proper foot ware may help.  Stretching exercises, losing weight, wearing shoes that have a cushioned heel that absorbs shock, and elevating the heel with the use of a heel cradle, heel cup, or orthotics may all help. Heel cradles and heel cups provide extra comfort and cushion to the heel, and reduce the amount of shock to the sore area. AVOIDING THE PAIN OF PLANTAR FASCIITIS AND HEEL  SPURS  Consult a sports medicine professional before beginning a new exercise program.  Walking programs offer a good workout. There is a lower chance of overuse injuries common to the runners. There is less impact and less jarring of the joints.  Begin all new exercise programs slowly. If problems or pains develop, decrease the amount of time or distance until you are at a comfortable level.  Wear good shoes and replace them regularly.  Stretch your foot and the heel cords at the back of the ankle (Achilles tendons) both before and after exercise.  Run or exercise on even surfaces that are not hard. For example, asphalt is better than pavement.  Do not run barefoot on hard surfaces.  If using a treadmill, vary the incline.  Do not continue to workout if you have foot or joint problems. Seek professional help if they do not improve. HOME CARE INSTRUCTIONS   Avoid activities that cause you pain until you recover.  Use ice or cold packs to the problem or painful areas after working out.  Only take over-the-counter or prescription medicines for pain, discomfort, or fever as directed by your caregiver.  Soft shoe inserts or athletic shoes with air or gel sole cushions may be helpful.  If problems continue or become more severe, consult a sports medicine caregiver. Cortisone is a potent anti-inflammatory medication that may be injected into the painful area. You can discuss this treatment with your caregiver. MAKE SURE YOU:     Understand these instructions.  Will watch your condition.  Will get help right away if you are not doing well or get worse. Document Released: 03/19/2005 Document Revised: 05/05/2011 Document Reviewed: 04/13/2013 Exit[employer] Patient Information 2015 [employer]. This information is not intended to replace advice given to you by your health care provider. Make sure you discuss any questions you have with your health care provider.  

## 2014-03-21 NOTE — Progress Notes (Signed)
Subjective:   Patient ID: Denise Parker, female    DOB: 1950/07/10, 64 y.o.   MRN: [id number]  Denise Parker is a pleasant 64 y.o. year old female who presents to clinic today with Foot Pain  on 03/21/2014  HPI:  Left heel pain for past several months.  She has tried changing foot wear, getting inserts which are not helping much.  Pain is in the morning but actually worse pain is after standing or walking long periods of time. Sometimes radiates up back of heel towards achilles. No swelling. No known injury. NSAIDS do help.  Walking on harder surfaces now too.  Moved their bedroom down into the basement.  Current Outpatient Prescriptions on File Prior to Visit  Medication Sig Dispense Refill  . atorvastatin (LIPITOR) 20 MG tablet Take 1 tablet (20 mg total) by mouth daily. 90 tablet 3  . CALCIUM-VITAMIN D PO Take 600 mg of calcium and 800 units of vitamin D daily    . CHONDROITIN SULFATE A PO Take 1200 mg's by mouth daily    . CRANBERRY-VITAMIN C PO Take by mouth daily.    . Glucosamine-Chondroit-Vit C-Mn (GLUCOSAMINE 1500 COMPLEX PO) Take by mouth daily.    . vitamin E 400 UNIT capsule Take 400 Units by mouth daily.    . valACYclovir (VALTREX) 1000 MG tablet Take 1,000 mg by mouth 4 (four) times daily.     No current facility-administered medications on file prior to visit.    No Known Allergies  Past Medical History  Diagnosis Date  . HLD (hyperlipidemia)     Past Surgical History  Procedure Laterality Date  . Breast biopsy    . Tonsillectomy      Family History  Problem Relation Age of Onset  . Heart disease Mother   . Heart disease Father     History   Social History  . Marital Status: Married    Spouse Name: N/A    Number of Children: N/A  . Years of Education: N/A   Occupational History  . Not on file.   Social History Main Topics  . Smoking status: Never Smoker   . Smokeless tobacco: Never Used  . Alcohol Use: 0.0 oz/week    0 Not  specified per week     Comment: Wine-regular  . Drug Use: No  . Sexual Activity: Not on file   Other Topics Concern  . Not on file   Social History Narrative   Commuting from [place] to [place] every two weeks.   Speech therapist, works with children.   Has one 54 yo son in [place] and 4 grandchildren.   Does not have a living will or HPOA   Desires CPR, would not want prolonged life support if futile.      The PMH, PSH, Social History, Family History, Medications, and allergies have been reviewed in [facility], and have been updated if relevant.   Review of Systems  Constitutional: Negative.   Musculoskeletal: Negative for gait problem.  Skin: Negative.   Neurological: Negative.   Psychiatric/Behavioral: Negative.   All other systems reviewed and are negative.      Objective:    BP 108/64 mmHg  Pulse 64  Temp(Src) 97.7 F (36.5 C) (Oral)  Wt 199 lb 8 oz (90.493 kg)   Physical Exam  Constitutional: She is oriented to person, place, and time. She appears well-developed.  HENT:  Head: Normocephalic.  Eyes: Conjunctivae are normal.  Neck: Normal range of motion.  Cardiovascular:  Normal rate.   Pulmonary/Chest: Effort normal.  Musculoskeletal: Normal range of motion.  Left heel TTP, neg laxity  Neurological: She is alert and oriented to person, place, and time. No cranial nerve deficit.  Skin: Skin is dry.  Psychiatric: She has a normal mood and affect. Her behavior is normal. Judgment and thought content normal.          Assessment & Plan:   Pain of left heel - Plan: DG Foot Complete Left No Follow-up on file.

## 2014-03-21 NOTE — Progress Notes (Signed)
Pre visit review using our clinic review tool, if applicable. No additional management support is needed unless otherwise documented below in the visit note. 

## 2014-03-21 NOTE — Assessment & Plan Note (Signed)
?   Heel spur with plantar fascitis. Xray today, given exercises from sports med advisor. NSAIDs advised with food- as directed. Call or return to clinic prn if these symptoms worsen or fail to improve as anticipated. The patient indicates understanding of these issues and agrees with the plan.

## 2014-03-23 ENCOUNTER — Other Ambulatory Visit: Payer: Self-pay | Admitting: Family Medicine

## 2014-03-23 DIAGNOSIS — M79672 Pain in left foot: Secondary | ICD-10-CM

## 2014-03-29 ENCOUNTER — Other Ambulatory Visit: Payer: Self-pay | Admitting: Family Medicine

## 2014-03-29 ENCOUNTER — Ambulatory Visit (INDEPENDENT_AMBULATORY_CARE_PROVIDER_SITE_OTHER): Payer: Medicare Other | Admitting: Podiatry

## 2014-03-29 ENCOUNTER — Encounter: Payer: Self-pay | Admitting: Podiatry

## 2014-03-29 VITALS — BP 139/73 | HR 69 | Resp 12

## 2014-03-29 DIAGNOSIS — E785 Hyperlipidemia, unspecified: Secondary | ICD-10-CM

## 2014-03-29 DIAGNOSIS — M722 Plantar fascial fibromatosis: Secondary | ICD-10-CM | POA: Diagnosis not present

## 2014-03-29 MED ORDER — MELOXICAM 15 MG PO TABS: 15.0000 mg | ORAL_TABLET | Freq: Every day | ORAL | Status: DC

## 2014-03-29 MED ORDER — METHYLPREDNISOLONE (PAK) 4 MG PO TABS: ORAL_TABLET | ORAL | Status: DC

## 2014-03-29 NOTE — Progress Notes (Signed)
   Subjective:    Patient ID: Denise Parker, female    DOB: 28-Nov-1950, 64 y.o.   MRN: [id number]  HPI  PT STATED LT FOOT HEEL HAVE SPUR DIAGNOSED BY DR. Clifton CustardAARON 1 MONTH. THE HEEL IS GETTING WORSE ESPECIALLY WHEN PUTTING PRESSURE ON IT. TRIED IBUPROFEN, OTC INSERTS IT HELP SOME.  Review of Systems  Constitutional: Positive for activity change.  Musculoskeletal: Positive for gait problem.  All other systems reviewed and are negative.      Objective:   Physical Exam: I have reviewed her past medical history medications allergies 30 social history and review of systems. Pulses are strongly palpable bilateral. Neurologic sensorium is intact percent lasting monofilament. Deep tendon reflexes are intact. Muscle strength is 5 over 5 dorsiflexion plantar flexors and inverters everters onto the musculature is intact. Orthopedic evaluation to restrict all joints distal to the ankle level for range of motion without crepitation. Pain on palpation medial calcaneal tubercle of the left heel. Radiographic evaluation demonstrates plantar and posterior calcaneal heel spurs with soft tissue increase in density of plantar fascial calcaneal insertion site. Indicative of plantar fasciitis.        Assessment & Plan:  Assessment: Plantar fasciitis left.  Plan: Discussed etiology pathology conservative versus surgical therapies. At this point we injected her left heel today with Kenalog and local anesthetic. She tolerated this procedure well. I prescribed a Medrol Dosepak to be followed by meloxicam. We discussed appropriate shoe gear stretching exercises ice therapy and shoe gear modifications. We discussed the etiology pathology conservative versus surgical therapies we also placed her in plantar fascial brace and a night splint. I will follow up with her in 1 month.

## 2014-03-31 ENCOUNTER — Other Ambulatory Visit (INDEPENDENT_AMBULATORY_CARE_PROVIDER_SITE_OTHER): Payer: Medicare Other

## 2014-03-31 DIAGNOSIS — E785 Hyperlipidemia, unspecified: Secondary | ICD-10-CM

## 2014-03-31 LAB — COMPREHENSIVE METABOLIC PANEL
ALT: 15 U/L (ref 0–35)
AST: 15 U/L (ref 0–37)
Albumin: 3.9 g/dL (ref 3.5–5.2)
Alkaline Phosphatase: 55 U/L (ref 39–117)
BUN: 13 mg/dL (ref 6–23)
CO2: 29 meq/L (ref 19–32)
Calcium: 9 mg/dL (ref 8.4–10.5)
Chloride: 106 mEq/L (ref 96–112)
Creatinine, Ser: 0.68 mg/dL (ref 0.40–1.20)
GFR: 92.24 mL/min (ref >60.00)
GLUCOSE: 96 mg/dL (ref 70–99)
Potassium: 4.4 mEq/L (ref 3.5–5.1)
Sodium: 140 mEq/L (ref 135–145)
TOTAL PROTEIN: 6.9 g/dL (ref 6.0–8.3)
Total Bilirubin: 0.6 mg/dL (ref 0.2–1.2)

## 2014-03-31 LAB — LIPID PANEL
CHOL/HDL RATIO: 3
Cholesterol: 205 mg/dL — ABNORMAL HIGH (ref 0–200)
HDL: 76.1 mg/dL (ref >39.00)
LDL Cholesterol: 115 mg/dL — ABNORMAL HIGH (ref 0–99)
NonHDL: 128.9
Triglycerides: 70 mg/dL (ref 0.0–149.0)
VLDL: 14 mg/dL (ref 0.0–40.0)

## 2014-04-07 ENCOUNTER — Other Ambulatory Visit: Payer: Managed Care, Other (non HMO)

## 2014-04-26 ENCOUNTER — Encounter: Payer: Self-pay | Admitting: Podiatry

## 2014-04-26 ENCOUNTER — Ambulatory Visit (INDEPENDENT_AMBULATORY_CARE_PROVIDER_SITE_OTHER): Payer: Medicare Other | Admitting: Podiatry

## 2014-04-26 VITALS — BP 112/68 | HR 65 | Resp 16

## 2014-04-26 DIAGNOSIS — M722 Plantar fascial fibromatosis: Secondary | ICD-10-CM | POA: Diagnosis not present

## 2014-04-27 NOTE — Progress Notes (Signed)
She presents today for follow-up of her plantar fasciitis of her left heel. She states there has been some improvement but is not well yet. She states that she is unable to wear the night splint during the evening through the night. She is able to wear while sitting watching television or reading.  Objective: Vital signs are stable she is alert and oriented 3. Pulses are strongly palpable. She has pain on palpation medial tubercle of the left heel.  Assessment: Chronic intractable plantar fasciitis left heel.  Plan: Injected left heel today and encouraged her to continue all conservative therapies including night splints a splint soaking icing shoe gear changes and medications. Follow up with her in 1 month

## 2014-05-31 ENCOUNTER — Ambulatory Visit: Payer: Medicare Other | Admitting: Podiatry

## 2014-06-12 ENCOUNTER — Encounter: Payer: Self-pay | Admitting: Podiatry

## 2014-06-12 ENCOUNTER — Ambulatory Visit (INDEPENDENT_AMBULATORY_CARE_PROVIDER_SITE_OTHER): Payer: Medicare Other | Admitting: Podiatry

## 2014-06-12 VITALS — BP 132/78 | HR 61 | Resp 16 | Ht 64.0 in | Wt 192.0 lb

## 2014-06-12 DIAGNOSIS — M722 Plantar fascial fibromatosis: Secondary | ICD-10-CM

## 2014-06-12 NOTE — Progress Notes (Signed)
She presents today for follow-up of plantar fasciitis to her left foot. She denies fever chills nausea vomiting muscle aches and pains.  Objective: Pulses are strongly palpable bilateral. Neurologic sensorium is intact per Semmes-Weinstein monofilament. Deep tendon reflexes are intact bilateral and muscle strength is 5 over 5 dorsiflexion plantar flexors and inverters everters on his musculature is intact. Orthopedic evaluation and a straight solid joints distal to the ankle full range of motion without crepitation. Pain on palpation medial calcaneal tubercle of the left heel.  Assessment: Plantar fasciitis left.  Plan: We injected her left heel today with Kenalog and local anesthetic encouraged her to continue all conservative therapies.

## 2014-07-26 ENCOUNTER — Ambulatory Visit (INDEPENDENT_AMBULATORY_CARE_PROVIDER_SITE_OTHER): Payer: Medicare Other | Admitting: Podiatry

## 2014-07-26 ENCOUNTER — Encounter: Payer: Self-pay | Admitting: Podiatry

## 2014-07-26 VITALS — BP 134/81 | HR 63 | Temp 97.3°F | Resp 16

## 2014-07-26 DIAGNOSIS — M722 Plantar fascial fibromatosis: Secondary | ICD-10-CM

## 2014-07-26 NOTE — Progress Notes (Signed)
She presents today for follow-up of plantar fasciitis to her left heel. States that it is intractable and continues to be problematic for her. She denies any changes in her past medical history medications or allergies.  Objective: Pulses are palpable left. She has pain on palpation medial continue tubercle of her left heel.  Assessment: Plantar fasciitis intractable nature left. Lateral calcaneal tubercle  Plan: Injected left heel today with prolonged local and aesthetic. She was also scanned for set of orthotics.

## 2014-08-23 ENCOUNTER — Encounter: Payer: Self-pay | Admitting: Podiatry

## 2014-08-23 ENCOUNTER — Ambulatory Visit (INDEPENDENT_AMBULATORY_CARE_PROVIDER_SITE_OTHER): Payer: Medicare Other | Admitting: Podiatry

## 2014-08-23 VITALS — BP 132/72 | HR 57 | Resp 16

## 2014-08-23 DIAGNOSIS — M722 Plantar fascial fibromatosis: Secondary | ICD-10-CM | POA: Diagnosis not present

## 2014-08-24 NOTE — Progress Notes (Signed)
She presents today for follow-up of her plantar fasciitis left. She states this seems the cortisone injection is not lasting as long.  Objective: Vital signs are stable she is alert and oriented 3 she has minimal pain on palpation medial calcaneal tubercle left. No calf pain and pulses remain palpable.  Assessment: Intractable plantar fasciitis left foot.  Plan: Currently regarding continue with conservative therapies and start the use of her orthotics. At this point this should help alleviate her symptoms. I'll follow-up with her in 1 month

## 2014-09-06 DIAGNOSIS — H2513 Age-related nuclear cataract, bilateral: Secondary | ICD-10-CM | POA: Diagnosis not present

## 2014-09-07 ENCOUNTER — Encounter: Payer: Self-pay | Admitting: Primary Care

## 2014-09-07 ENCOUNTER — Ambulatory Visit (INDEPENDENT_AMBULATORY_CARE_PROVIDER_SITE_OTHER): Payer: Medicare Other | Admitting: Primary Care

## 2014-09-07 VITALS — BP 132/78 | HR 104 | Temp 98.6°F | Ht 64.0 in | Wt 198.4 lb

## 2014-09-07 DIAGNOSIS — H9202 Otalgia, left ear: Secondary | ICD-10-CM

## 2014-09-07 MED ORDER — NEOMYCIN-POLYMYXIN-HC 3.5-10000-1 OT SOLN: 3.0000 [drp] | Freq: Three times a day (TID) | OTIC | Status: DC

## 2014-09-07 NOTE — Progress Notes (Signed)
Subjective:    Patient ID: Denise Parker, female    DOB: [birth year], 64 y.o.   MRN: [id number]  HPI  Denise Parker is a 64 year old female who presents today with a chief complaint of ear pain and itching. Her pain is present to the left ear. She first noticed it this morning and has been intermittent with "sharp" pain. Overall she's feeling slightly better. She has not taken any medication for her pain. Denies fevers, chills, nausea, body aches, ear fullness.  Review of Systems  Constitutional: Negative for fever and chills.  HENT: Positive for ear pain. Negative for congestion, postnasal drip, rhinorrhea, sinus pressure and sore throat.        Ear itching  Respiratory: Negative for cough and shortness of breath.   Cardiovascular: Negative for chest pain.  Musculoskeletal: Negative for myalgias.       Past Medical History  Diagnosis Date  . HLD (hyperlipidemia)     History   Social History  . Marital Status: Married    Spouse Name: N/A  . Number of Children: N/A  . Years of Education: N/A   Occupational History  . Not on file.   Social History Main Topics  . Smoking status: Never Smoker   . Smokeless tobacco: Never Used  . Alcohol Use: 0.0 oz/week    0 Standard drinks or equivalent per week     Comment: Wine-regular  . Drug Use: No  . Sexual Activity: Not on file   Other Topics Concern  . Not on file   Social History Narrative   Commuting from [place] to [place] every two weeks.   Speech therapist, works with children.   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   Does not have a living will or HPOA   Desires CPR, would not want prolonged life support if futile.       Past Surgical History  Procedure Laterality Date  . Breast biopsy    . Tonsillectomy      Family History  Problem Relation Age of Onset  . Heart disease Mother   . Heart disease Father     No Known Allergies  Current Outpatient Prescriptions on File Prior to Visit  Medication  Sig Dispense Refill  . atorvastatin (LIPITOR) 20 MG tablet Take 1 tablet (20 mg total) by mouth daily. 90 tablet 3  . CALCIUM-VITAMIN D PO Take 600 mg of calcium and 800 units of vitamin D daily    . CHONDROITIN SULFATE A PO Take 1200 mg's by mouth daily    . CRANBERRY-VITAMIN C PO Take by mouth daily.    . Glucosamine-Chondroit-Vit C-Mn (GLUCOSAMINE 1500 COMPLEX PO) Take by mouth daily.    . vitamin E 400 UNIT capsule Take 400 Units by mouth daily.     No current facility-administered medications on file prior to visit.    BP 132/78 mmHg  Pulse 104  Temp(Src) 98.6 F (37 C) (Oral)  Ht  (1.626 m)  Wt 198 lb 6.4 oz (89.994 kg)  BMI 34.04 kg/m2  SpO2 94%    Objective:   Physical Exam  HENT:  Right Ear: Tympanic membrane and ear canal normal. Tympanic membrane is not erythematous. No middle ear effusion.  Left Ear: Tympanic membrane and ear canal normal. Tympanic membrane is not erythematous.  No middle ear effusion.  Nose: Nose normal.  Mouth/Throat: Oropharynx is clear and moist.  Eyes: Conjunctivae are normal. Pupils are equal, round, and reactive to  light.  Neck: Neck supple.  Cardiovascular: Normal rate and regular rhythm.   Pulmonary/Chest: Effort normal and breath sounds normal.  Lymphadenopathy:    She has no cervical adenopathy.  Skin: Skin is warm and dry.          Assessment & Plan:  Ear pain and itching:  Present since this morning. Feeling better during office visit. TM's unremarkable bilaterally without effusion or erythema. Canals slightly red, no infection noted. Supportive measures: oral antihistamine for itching, ibuprofen for pain. [place], so RX printed for cortisporin drops if canals become irritated. She will fill only if needed. Follow up PRN

## 2014-09-07 NOTE — Progress Notes (Signed)
Pre visit review using our clinic review tool, if applicable. No additional management support is needed unless otherwise documented below in the visit note. 

## 2014-09-07 NOTE — Patient Instructions (Signed)
Start taking Zyrtec for the ear itching. You may take ibuprofen for pain.  Fill the drops if your pain becomes worse in 3 days or if you develop fevers.  It was nice meeting you!

## 2014-10-04 ENCOUNTER — Ambulatory Visit: Payer: Managed Care, Other (non HMO) | Admitting: Podiatry

## 2014-10-26 ENCOUNTER — Encounter: Payer: Self-pay | Admitting: Family Medicine

## 2014-10-26 ENCOUNTER — Ambulatory Visit (INDEPENDENT_AMBULATORY_CARE_PROVIDER_SITE_OTHER): Payer: Medicare Other | Admitting: Family Medicine

## 2014-10-26 VITALS — BP 104/62 | HR 65 | Temp 98.0°F | Wt 203.5 lb

## 2014-10-26 DIAGNOSIS — H9202 Otalgia, left ear: Secondary | ICD-10-CM | POA: Diagnosis not present

## 2014-10-26 MED ORDER — AMOXICILLIN-POT CLAVULANATE 875-125 MG PO TABS: 1.0000 | ORAL_TABLET | Freq: Two times a day (BID) | ORAL | Status: AC

## 2014-10-26 MED ORDER — NEOMYCIN-POLYMYXIN-HC 3.5-10000-1 OT SOLN: 3.0000 [drp] | Freq: Three times a day (TID) | OTIC | Status: DC

## 2014-10-26 NOTE — Progress Notes (Signed)
SUBJECTIVE: Denise Parker is a 64 y.o. female  with 3 day(s) history of pain of right ear, and coryza, congestion, sore throat, productive cough and bilateral sinus pain. Temperature not measured at home.   OBJECTIVE:  BP 104/62 mmHg  Pulse 65  Temp(Src) 98 F (36.7 C) (Oral)  Wt 203 lb 8 oz (92.307 kg)  SpO2 99% General appearance: alert, well appearing, and in no distress and oriented to person, place, and time.   Ears: left ear normal, right TM red, dull, bulging Nose: mucosal erythema Oropharynx: mucous membranes moist, pharynx normal without lesions Neck: supple, no significant adenopathy Lungs: clear to auscultation, no wheezes, rales or rhonchi, symmetric air entry  Current Outpatient Prescriptions on File Prior to Visit  Medication Sig Dispense Refill  . atorvastatin (LIPITOR) 20 MG tablet Take 1 tablet (20 mg total) by mouth daily. 90 tablet 3  . CALCIUM-VITAMIN D PO Take 600 mg of calcium and 800 units of vitamin D daily    . CHONDROITIN SULFATE A PO Take 1200 mg's by mouth daily    . CRANBERRY-VITAMIN C PO Take by mouth daily.    . Glucosamine-Chondroit-Vit C-Mn (GLUCOSAMINE 1500 COMPLEX PO) Take by mouth daily.    Marland Kitchen ibuprofen (ADVIL,MOTRIN) 400 MG tablet Take 400 mg by mouth daily.    . vitamin E 400 UNIT capsule Take 400 Units by mouth daily.     No current facility-administered medications on file prior to visit.    No Known Allergies  Past Medical History  Diagnosis Date  . HLD (hyperlipidemia)     Past Surgical History  Procedure Laterality Date  . Breast biopsy    . Tonsillectomy      Family History  Problem Relation Age of Onset  . Heart disease Mother   . Heart disease Father     Social History   Social History  . Marital Status: Married    Spouse Name: N/A  . Number of Children: N/A  . Years of Education: N/A   Occupational History  . Not on file.   Social History Main Topics  . Smoking status: Never Smoker   . Smokeless  tobacco: Never Used  . Alcohol Use: 0.0 oz/week    0 Standard drinks or equivalent per week     Comment: Wine-regular  . Drug Use: No  . Sexual Activity: Not on file   Other Topics Concern  . Not on file   Social History Narrative   Commuting from [place] to [place] every two weeks.   Speech therapist, works with children.   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   Does not have a living will or HPOA   Desires CPR, would not want prolonged life support if futile.      The PMH, PSH, Social History, Family History, Medications, and allergies have been reviewed in [facility], and have been updated if relevant.  ASSESSMENT: Otitis Media  PLAN: 1) See orders for this visit as documented in the electronic medical record- Augmentin 1 tablet twice daily x 10 days. 2) Symptomatic therapy suggested: use ibuprofen, antihistamine-decongestant of choice prn.  3) Call or return to clinic prn if these symptoms worsen or fail to improve as anticipated.

## 2014-10-26 NOTE — Progress Notes (Signed)
Pre visit review using our clinic review tool, if applicable. No additional management support is needed unless otherwise documented below in the visit note. 

## 2014-10-26 NOTE — Patient Instructions (Signed)
Great to see you. Please take Augmentin as directed- 1 tablet twice daily for 10 days. Ok to continue zyrtec and your ear drops.  Please keep me updated.

## 2015-01-01 ENCOUNTER — Other Ambulatory Visit: Payer: Self-pay | Admitting: Family Medicine

## 2015-06-13 ENCOUNTER — Ambulatory Visit (INDEPENDENT_AMBULATORY_CARE_PROVIDER_SITE_OTHER): Payer: Medicare Other

## 2015-06-13 VITALS — BP 120/70 | HR 63 | Temp 98.3°F | Ht 64.0 in | Wt 206.5 lb

## 2015-06-13 DIAGNOSIS — Z0000 Encounter for general adult medical examination without abnormal findings: Secondary | ICD-10-CM | POA: Diagnosis not present

## 2015-06-13 DIAGNOSIS — E7800 Pure hypercholesterolemia, unspecified: Secondary | ICD-10-CM

## 2015-06-13 DIAGNOSIS — Z1159 Encounter for screening for other viral diseases: Secondary | ICD-10-CM

## 2015-06-13 DIAGNOSIS — E669 Obesity, unspecified: Secondary | ICD-10-CM

## 2015-06-13 NOTE — Patient Instructions (Signed)
Ms. Lequita HaltMorgan , Thank you for taking time to come for your Medicare Wellness Visit. I appreciate your ongoing commitment to your health goals. Please review the following plan we discussed and let me know if I can assist you in the future.   These are the goals we discussed: Goals    . Increase physical activity     Starting 06/13/2015, I will continue to exercise for at least 60 min daily in an effort to lose weight.        This is a list of the screening recommended for you and due dates:  Health Maintenance  Topic Date Due  . Mammogram  06/20/2015*  . DEXA scan (bone density measurement)  06/20/2015*  .  Hepatitis C: One time screening is recommended by Center for Disease Control  (CDC) for  adults born from [birth year] through 1967.   06/20/2015*  . Flu Shot  09/25/2015  . Pneumonia vaccines (2 of 2 - PPSV23) 11/23/2017  . Colon Cancer Screening  01/24/2018  . Tetanus Vaccine  01/24/2021  . Shingles Vaccine  Completed  *Topic was postponed. The date shown is not the original due date.   Preventive Care for Adults  A healthy lifestyle and preventive care can promote health and wellness. Preventive health guidelines for adults include the following key practices.  . A routine yearly physical is a good way to check with your health care provider about your health and preventive screening. It is a chance to share any concerns and updates on your health and to receive a thorough exam.  . Visit your dentist for a routine exam and preventive care every 6 months. Brush your teeth twice a day and floss once a day. Good oral hygiene prevents tooth decay and gum disease.  . The frequency of eye exams is based on your age, health, family medical history, use  of contact lenses, and other factors. Follow your health care provider's ecommendations for frequency of eye exams.  . Eat a healthy diet. Foods like vegetables, fruits, whole grains, low-fat dairy products, and lean protein foods contain the  nutrients you need without too many calories. Decrease your intake of foods high in solid fats, added sugars, and salt. Eat the right amount of calories for you. Get information about a proper diet from your health care provider, if necessary.  . Regular physical exercise is one of the most important things you can do for your health. Most adults should get at least 150 minutes of moderate-intensity exercise (any activity that increases your heart rate and causes you to sweat) each week. In addition, most adults need muscle-strengthening exercises on 2 or more days a week.  Silver Sneakers may be a benefit available to you. To determine eligibility, you may visit the website: www.silversneakers.com or contact program at [phone] Mon-Fri between 8AM-8PM.   . Maintain a healthy weight. The body mass index (BMI) is a screening tool to identify possible weight problems. It provides an estimate of body fat based on height and weight. Your health care provider can find your BMI and can help you achieve or maintain a healthy weight.   For adults 20 years and older: ? A BMI below 18.5 is considered underweight. ? A BMI of 18.5 to 24.9 is normal. ? A BMI of 25 to 29.9 is considered overweight. ? A BMI of 30 and above is considered obese.   . Maintain normal blood lipids and cholesterol levels by exercising and minimizing your intake of saturated  fat. Eat a balanced diet with plenty of fruit and vegetables. Blood tests for lipids and cholesterol should begin at age 58 and be repeated every 5 years. If your lipid or cholesterol levels are high, you are over 50, or you are at high risk for heart disease, you may need your cholesterol levels checked more frequently. Ongoing high lipid and cholesterol levels should be treated with medicines if diet and exercise are not working.  . If you smoke, find out from your health care provider how to quit. If you do not use tobacco, please do not start.  . If you  choose to drink alcohol, please do not consume more than 2 drinks per day. One drink is considered to be 12 ounces (355 mL) of beer, 5 ounces (148 mL) of wine, or 1.5 ounces (44 mL) of liquor.  . If you are 75-48 years old, ask your health care provider if you should take aspirin to prevent strokes.  . Use sunscreen. Apply sunscreen liberally and repeatedly throughout the day. You should seek shade when your shadow is shorter than you. Protect yourself by wearing long sleeves, pants, a wide-brimmed hat, and sunglasses year round, whenever you are outdoors.  . Once a month, do a whole body skin exam, using a mirror to look at the skin on your back. Tell your health care provider of new moles, moles that have irregular borders, moles that are larger than a pencil eraser, or moles that have changed in shape or color.

## 2015-06-13 NOTE — Progress Notes (Signed)
Pre visit review using our clinic review tool, if applicable. No additional management support is needed unless otherwise documented below in the visit note. 

## 2015-06-13 NOTE — Progress Notes (Signed)
Subjective:   Denise Parker is a 65 y.o. female who presents for Medicare Annual (Subsequent) preventive examination.  Cardiac Risk Factors include: advanced age (>69men, >75 women);obesity (BMI >30kg/m2)     Objective:     Vitals: BP 120/70 mmHg  Pulse 63  Temp(Src) 98.3 F (36.8 C) (Oral)  Ht  (1.626 m)  Wt 206 lb 8 oz (93.668 kg)  BMI 35.43 kg/m2  SpO2 98%  Body mass index is 35.43 kg/(m^2).   Tobacco History  Smoking status  . Never Smoker   Smokeless tobacco  . Never Used     Counseling given: No   Past Medical History  Diagnosis Date  . HLD (hyperlipidemia)    Past Surgical History  Procedure Laterality Date  . Breast biopsy    . Tonsillectomy     Family History  Problem Relation Age of Onset  . Heart disease Mother   . Heart disease Father    History  Sexual Activity  . Sexual Activity: Yes    Outpatient Encounter Prescriptions as of 06/13/2015  Medication Sig  . atorvastatin (LIPITOR) 20 MG tablet TAKE 1 TABLET BY MOUTH EVERY DAY  . CALCIUM-VITAMIN D PO Take 600 mg of calcium and 800 units of vitamin D daily  . cetirizine (ZYRTEC) 5 MG tablet Take 5 mg by mouth daily.  Marland Kitchen CRANBERRY-VITAMIN C PO Take by mouth daily.  . Flaxseed, Linseed, (FLAXSEED OIL PO) Take 1 capsule by mouth.  . fluticasone (FLONASE) 50 MCG/ACT nasal spray Place 1 spray into both nostrils daily.  . Glucosamine-Chondroit-Vit C-Mn (GLUCOSAMINE 1500 COMPLEX PO) Take by mouth daily.  . Turmeric 500 MG TABS Take 500 mg by mouth.  . vitamin C (ASCORBIC ACID) 250 MG tablet Take 250 mg by mouth daily.  . vitamin E 400 UNIT capsule Take 400 Units by mouth daily.  . [DISCONTINUED] CHONDROITIN SULFATE A PO Take 1200 mg's by mouth daily  . [DISCONTINUED] ibuprofen (ADVIL,MOTRIN) 400 MG tablet Take 400 mg by mouth daily.  . [DISCONTINUED] neomycin-polymyxin-hydrocortisone (CORTISPORIN) otic solution Place 3 drops into the left ear 3 (three) times daily.   No  facility-administered encounter medications on file as of 06/13/2015.    Activities of Daily Living In your present state of health, do you have any difficulty performing the following activities: 06/13/2015  Hearing? N  Vision? N  Difficulty concentrating or making decisions? N  Walking or climbing stairs? N  Dressing or bathing? N  Doing errands, shopping? N  Preparing Food and eating ? N  Using the Toilet? N  In the past six months, have you accidently leaked urine? N  Do you have problems with loss of bowel control? N  Managing your Medications? N  Managing your Finances? N  Housekeeping or managing your Housekeeping? N    Patient Care Team: Dianne Dun, MD as PCP - General (Family Medicine) Lemar Lofty, MD as Consulting Physician (Unknown Physician Specialty)    Assessment:     Hearing Screening           Right ear:   40 0 40 0   Left ear:   0 0 40 0   Vision Screening Comments: Last eye exam in October 2016   Exercise Activities and Dietary recommendations Current Exercise Habits: Structured exercise class;Home exercise routine, Type of exercise: walking;Other - see comments (water aerobics), Time (Minutes): 60, Frequency (Times/Week): 7, Weekly Exercise (Minutes/Week): 420, Intensity: Moderate, Exercise limited by: None identified  Goals    .  Increase physical activity     Starting 06/13/2015, I will continue to exercise for at least 60 min daily in an effort to lose weight.       Fall Risk Fall Risk  06/13/2015 01/24/2014  Falls in the past year? No -  Number falls in past yr: - 1   Depression Screen PHQ 2/9 Scores 06/13/2015 01/24/2014  PHQ - 2 Score 0 0     Cognitive Testing MMSE - Mini Mental State Exam 06/13/2015  Orientation to time 5  Orientation to Place 5  Registration 3  Attention/ Calculation 0  Recall 3  Language- name 2 objects 0  Language- repeat 1  Language- follow 3 step command 3  Language- read &  follow direction 0  Write a sentence 0  Copy design 0  Total score 20   PLEASE NOTE: A Mini-Cog screen was completed. Maximum score is 20. A value of 0 denotes this part of Folstein MMSE was not completed.  Orientation to Time - Max 5 Orientation to Place - Max 5 Registration - Max 3 Recall - Max 3 Language Repeat - Max 1 Language Follow 3 Step Command - Max 3   Immunization History  Administered Date(s) Administered  . Influenza-Unspecified 12/08/2013  . Pneumococcal Conjugate-13 01/24/2014  . Pneumococcal-Unspecified 11/23/2012  . Zoster 06/16/2011   Screening Tests Health Maintenance  Topic Date Due  . MAMMOGRAM  06/20/2015 (Originally 03/15/2015)  . DEXA SCAN  06/20/2015 (Originally 01/21/2014)  . Hepatitis C Screening  06/20/2015 (Originally [birth year])  . INFLUENZA VACCINE  09/25/2015  . PNA vac Low Risk Adult (2 of 2 - PPSV23) 11/23/2017  . COLONOSCOPY  01/24/2018  . TETANUS/TDAP  01/24/2021  . ZOSTAVAX  Completed      Plan:     I have personally reviewed and addressed the Medicare Annual Wellness questionnaire and have noted the following in the patient's chart:  A. Medical and social history B. Use of alcohol, tobacco or illicit drugs  C. Current medications and supplements D. Functional ability and status E.  Nutritional status F.  Physical activity G. Advance directives H. List of other physicians I.  Hospitalizations, surgeries, and ER visits in previous 12 months J.  Vitals K. Screenings to include hearing, vision, cognitive, depression L. Referrals and appointments - none  In addition, I have reviewed and discussed with patient certain preventive protocols, quality metrics, and best practice recommendations. A written personalized care plan for preventive services as well as general preventive health recommendations were provided to patient.  See attached scanned questionnaire for additional information.   Signed,   Randa EvensLesia Amayrany Cafaro, MHA, BS,  LPN Health Advisor 06/13/2015

## 2015-06-13 NOTE — Progress Notes (Signed)
Nurse concerns:  Please discuss mammogram and bone density with patient during well woman exam on 06/20/2015.

## 2015-06-14 ENCOUNTER — Other Ambulatory Visit (INDEPENDENT_AMBULATORY_CARE_PROVIDER_SITE_OTHER): Payer: Medicare Other

## 2015-06-14 DIAGNOSIS — E7800 Pure hypercholesterolemia, unspecified: Secondary | ICD-10-CM

## 2015-06-14 DIAGNOSIS — Z1159 Encounter for screening for other viral diseases: Secondary | ICD-10-CM

## 2015-06-14 DIAGNOSIS — E669 Obesity, unspecified: Secondary | ICD-10-CM | POA: Diagnosis not present

## 2015-06-14 LAB — LIPID PANEL
CHOL/HDL RATIO: 3
Cholesterol: 198 mg/dL (ref 0–200)
HDL: 72.6 mg/dL (ref >39.00)
LDL Cholesterol: 114 mg/dL — ABNORMAL HIGH (ref 0–99)
NONHDL: 125.13
Triglycerides: 58 mg/dL (ref 0.0–149.0)
VLDL: 11.6 mg/dL (ref 0.0–40.0)

## 2015-06-14 LAB — COMPREHENSIVE METABOLIC PANEL
ALT: 15 U/L (ref 0–35)
AST: 15 U/L (ref 0–37)
Albumin: 4.1 g/dL (ref 3.5–5.2)
Alkaline Phosphatase: 52 U/L (ref 39–117)
BUN: 13 mg/dL (ref 6–23)
CHLORIDE: 105 meq/L (ref 96–112)
CO2: 30 meq/L (ref 19–32)
Calcium: 9.2 mg/dL (ref 8.4–10.5)
Creatinine, Ser: 0.68 mg/dL (ref 0.40–1.20)
GFR: 91.9 mL/min (ref >60.00)
GLUCOSE: 103 mg/dL — AB (ref 70–99)
POTASSIUM: 3.8 meq/L (ref 3.5–5.1)
Sodium: 141 mEq/L (ref 135–145)
Total Bilirubin: 0.4 mg/dL (ref 0.2–1.2)
Total Protein: 7 g/dL (ref 6.0–8.3)

## 2015-06-14 LAB — CBC WITH DIFFERENTIAL/PLATELET
Basophils Absolute: 0 10*3/uL (ref 0.0–0.1)
Basophils Relative: 0.8 % (ref 0.0–3.0)
EOS PCT: 2.1 % (ref 0.0–5.0)
Eosinophils Absolute: 0.1 10*3/uL (ref 0.0–0.7)
HCT: 39.5 % (ref 36.0–46.0)
Hemoglobin: 13.1 g/dL (ref 12.0–15.0)
LYMPHS ABS: 2.2 10*3/uL (ref 0.7–4.0)
Lymphocytes Relative: 37.4 % (ref 12.0–46.0)
MCHC: 33.1 g/dL (ref 30.0–36.0)
MCV: 84.1 fl (ref 78.0–100.0)
MONOS PCT: 7.2 % (ref 3.0–12.0)
Monocytes Absolute: 0.4 10*3/uL (ref 0.1–1.0)
NEUTROS ABS: 3.1 10*3/uL (ref 1.4–7.7)
NEUTROS PCT: 52.5 % (ref 43.0–77.0)
PLATELETS: 240 10*3/uL (ref 150.0–400.0)
RBC: 4.7 Mil/uL (ref 3.87–5.11)
RDW: 15 % (ref 11.5–15.5)
WBC: 5.8 10*3/uL (ref 4.0–10.5)

## 2015-06-14 NOTE — Progress Notes (Signed)
I reviewed health advisor's note, was available for consultation, and agree with documentation and plan.  

## 2015-06-15 LAB — HEPATITIS C ANTIBODY: HCV AB: NEGATIVE

## 2015-06-20 ENCOUNTER — Other Ambulatory Visit (HOSPITAL_COMMUNITY)
Admission: RE | Admit: 2015-06-20 | Discharge: 2015-06-20 | Disposition: A | Discharge status: Home / Self Care | Payer: Medicare Other | Source: Ambulatory Visit | Attending: Internal Medicine | Admitting: Internal Medicine

## 2015-06-20 ENCOUNTER — Other Ambulatory Visit (HOSPITAL_COMMUNITY): Admission: RE | Admit: 2015-06-20 | Payer: Medicare Other | Source: Ambulatory Visit

## 2015-06-20 ENCOUNTER — Encounter: Payer: Self-pay | Admitting: Family Medicine

## 2015-06-20 ENCOUNTER — Ambulatory Visit (INDEPENDENT_AMBULATORY_CARE_PROVIDER_SITE_OTHER): Payer: Medicare Other | Admitting: Family Medicine

## 2015-06-20 VITALS — BP 122/66 | HR 69 | Temp 97.9°F | Ht 63.75 in | Wt 205.0 lb

## 2015-06-20 DIAGNOSIS — Z01419 Encounter for gynecological examination (general) (routine) without abnormal findings: Secondary | ICD-10-CM

## 2015-06-20 DIAGNOSIS — M1990 Unspecified osteoarthritis, unspecified site: Secondary | ICD-10-CM

## 2015-06-20 DIAGNOSIS — Z8249 Family history of ischemic heart disease and other diseases of the circulatory system: Secondary | ICD-10-CM | POA: Insufficient documentation

## 2015-06-20 DIAGNOSIS — E785 Hyperlipidemia, unspecified: Secondary | ICD-10-CM | POA: Diagnosis not present

## 2015-06-20 DIAGNOSIS — Z79899 Other long term (current) drug therapy: Secondary | ICD-10-CM | POA: Insufficient documentation

## 2015-06-20 DIAGNOSIS — Z1151 Encounter for screening for human papillomavirus (HPV): Secondary | ICD-10-CM | POA: Insufficient documentation

## 2015-06-20 NOTE — Assessment & Plan Note (Signed)
Deteriorated. Refer to OT to work with her grip strength. Consider rheum referral as well. Pt wants to hold off on referral for now.

## 2015-06-20 NOTE — Patient Instructions (Signed)
Good to see you. We will call you with your OT appointment.

## 2015-06-20 NOTE — Addendum Note (Signed)
Addended by: Desmond DikeKNIGHT, Zafar Debrosse H on: 06/20/2015 12:34 PM   Modules accepted: Orders

## 2015-06-20 NOTE — Assessment & Plan Note (Signed)
Pap smear done today. Pt to call to schedule mammogram.

## 2015-06-20 NOTE — Assessment & Plan Note (Signed)
Well controlled on current dose of lipitor. NO changes made today.

## 2015-06-20 NOTE — Progress Notes (Signed)
Subjective:   Patient ID: Denise Feinsteinatricia Ann Parker, female    DOB: 1950/12/08, 65 y.o.   MRN: [id number]  Denise Feinsteinatricia Ann Parker is a pleasant 65 y.o. year old female who presents to clinic today with Annual Exam  on 06/20/2015  HPI: Medicare annual wellness visit already completed by Denise Parker on 06/13/15.  Notes reviewed today.  She has been having more hand pain from her arthritis.  It is starting to impact what she can do and her grip strength.  HLD- lipids well controlled with lipitor 20 mg daily and flaxseed oil. Lab Results  Component Value Date   CHOL 198 06/14/2015   HDL 72.60 06/14/2015   LDLCALC 114* 06/14/2015   LDLDIRECT 171.8 09/27/2012   TRIG 58.0 06/14/2015   CHOLHDL 3 06/14/2015   Lab Results  Component Value Date   CREATININE 0.68 06/14/2015   Lab Results  Component Value Date   NA 141 06/14/2015   K 3.8 06/14/2015   CL 105 06/14/2015   CO2 30 06/14/2015   Lab Results  Component Value Date   TSH 2.31 01/24/2014   Current Outpatient Prescriptions on File Prior to Visit  Medication Sig Dispense Refill  . atorvastatin (LIPITOR) 20 MG tablet TAKE 1 TABLET BY MOUTH EVERY DAY 90 tablet 1  . CALCIUM-VITAMIN D PO Take 600 mg of calcium and 800 units of vitamin D daily    . cetirizine (ZYRTEC) 5 MG tablet Take 5 mg by mouth daily.    Marland Kitchen. CRANBERRY-VITAMIN C PO Take by mouth daily.    . Flaxseed, Linseed, (FLAXSEED OIL PO) Take 1 capsule by mouth.    . fluticasone (FLONASE) 50 MCG/ACT nasal spray Place 1 spray into both nostrils daily.    . Turmeric 500 MG TABS Take 500 mg by mouth.    . vitamin C (ASCORBIC ACID) 250 MG tablet Take 250 mg by mouth daily.    . vitamin E 400 UNIT capsule Take 400 Units by mouth daily.     No current facility-administered medications on file prior to visit.    No Known Allergies  Past Medical History  Diagnosis Date  . HLD (hyperlipidemia)     Past Surgical History  Procedure Laterality Date  . Breast biopsy    .  Tonsillectomy      Family History  Problem Relation Age of Onset  . Heart disease Mother   . Heart disease Father     Social History   Social History  . Marital Status: Married    Spouse Name: N/A  . Number of Children: N/A  . Years of Education: N/A   Occupational History  . Not on file.   Social History Main Topics  . Smoking status: Never Smoker   . Smokeless tobacco: Never Used  . Alcohol Use: 1.8 oz/week    0 Standard drinks or equivalent, 3 Glasses of wine per week     Comment: Wine-regular  . Drug Use: No  . Sexual Activity: Yes   Other Topics Concern  . Not on file   Social History Narrative   [place] from [place] every two weeks.   Speech therapist, works with children.   Has one 67 yo son in [place] and 4 grandchildren.   Does not have a living will or HPOA   Desires CPR, would not want prolonged life support if futile.      The PMH, PSH, Social History, Family History, Medications, and [facility], and have  been updated if relevant.   Review of Systems  Constitutional: Negative.   HENT: Negative.   Eyes: Negative.   Respiratory: Negative.   Cardiovascular: Negative.   Gastrointestinal: Negative.   Endocrine: Negative.   Genitourinary: Negative.   Musculoskeletal: Positive for joint swelling.  Allergic/Immunologic: Negative.   Neurological: Negative.   Hematological: Negative.   Psychiatric/Behavioral: Negative.   All other systems reviewed and are negative.      Objective:    BP 122/66 mmHg  Pulse 69  Temp(Src) 97.9 F (36.6 C) (Oral)  Ht 5' 3.75" (1.619 m)  Wt 205 lb (92.987 kg)  BMI 35.48 kg/m2  SpO2 99%   Physical Exam   General:  Well-developed,well-nourished,in no acute distress; alert,appropriate and cooperative throughout examination Head:  normocephalic and atraumatic.   Eyes:  vision grossly intact, pupils equal, pupils round, and pupils reactive to light.   Ears:  R ear normal and L ear  normal.   Nose:  no external deformity.   Mouth:  good dentition.   Neck:  No deformities, masses, or tenderness noted. Breasts:  No mass, nodules, thickening, tenderness, bulging, retraction, inflamation, nipple discharge or skin changes noted.   Lungs:  Normal respiratory effort, chest expands symmetrically. Lungs are clear to auscultation, no crackles or wheezes. Heart:  Normal rate and regular rhythm. S1 and S2 normal without gallop, murmur, click, rub or other extra sounds. Abdomen:  Bowel sounds positive,abdomen soft and non-tender without masses, organomegaly or hernias noted. Rectal:  no external abnormalities.   Genitalia:  Pelvic Exam:        External: normal female genitalia without lesions or masses        Vagina: normal without lesions or masses        Cervix: normal without lesions or masses        Adnexa: normal bimanual exam without masses or fullness        Uterus: normal by palpation        Pap smear: performed Msk:  No deformity or scoliosis noted of thoracic or lumbar spine.   Extremities:  Enlarged DIPS and PIPs bilaterally Neurologic:  alert & oriented X3 and gait normal.   Skin:  Intact without suspicious lesions or rashes Cervical Nodes:  No lymphadenopathy noted Axillary Nodes:  No palpable lymphadenopathy Psych:  Cognition and judgment appear intact. Alert and cooperative with normal attention span and concentration. No apparent delusions, illusions, hallucinations       Assessment & Plan:   HLD (hyperlipidemia)  Well woman exam  Encounter for routine gynecological examination  Arthritis No Follow-up on file.

## 2015-06-22 ENCOUNTER — Encounter: Payer: Self-pay | Admitting: *Deleted

## 2015-06-22 LAB — CYTOLOGY - PAP

## 2015-07-24 DIAGNOSIS — M79641 Pain in right hand: Secondary | ICD-10-CM | POA: Diagnosis not present

## 2015-07-24 DIAGNOSIS — M79642 Pain in left hand: Secondary | ICD-10-CM | POA: Diagnosis not present

## 2015-07-26 DIAGNOSIS — M79641 Pain in right hand: Secondary | ICD-10-CM | POA: Diagnosis not present

## 2015-07-26 DIAGNOSIS — M79642 Pain in left hand: Secondary | ICD-10-CM | POA: Diagnosis not present

## 2015-07-27 DIAGNOSIS — M79642 Pain in left hand: Secondary | ICD-10-CM | POA: Diagnosis not present

## 2015-07-27 DIAGNOSIS — M79641 Pain in right hand: Secondary | ICD-10-CM | POA: Diagnosis not present

## 2015-08-07 ENCOUNTER — Other Ambulatory Visit: Payer: Self-pay | Admitting: Family Medicine

## 2015-08-07 DIAGNOSIS — M79642 Pain in left hand: Secondary | ICD-10-CM | POA: Diagnosis not present

## 2015-08-07 DIAGNOSIS — Z1231 Encounter for screening mammogram for malignant neoplasm of breast: Secondary | ICD-10-CM

## 2015-08-07 DIAGNOSIS — M79641 Pain in right hand: Secondary | ICD-10-CM | POA: Diagnosis not present

## 2015-08-08 DIAGNOSIS — M79642 Pain in left hand: Secondary | ICD-10-CM | POA: Diagnosis not present

## 2015-08-08 DIAGNOSIS — M79641 Pain in right hand: Secondary | ICD-10-CM | POA: Diagnosis not present

## 2015-08-16 ENCOUNTER — Other Ambulatory Visit: Payer: Self-pay

## 2015-08-16 MED ORDER — ATORVASTATIN CALCIUM 20 MG PO TABS: 20.0000 mg | ORAL_TABLET | Freq: Every day | ORAL | Status: DC

## 2015-08-16 NOTE — Telephone Encounter (Signed)
Pt request refill atorvastatin to [facility] [address]. Last annual exam on 06/20/15. Refill done per protocol and pt will ck with pharmacy.

## 2015-08-22 ENCOUNTER — Ambulatory Visit
Admission: RE | Admit: 2015-08-22 | Discharge: 2015-08-22 | Disposition: A | Discharge status: Home / Self Care | Payer: Medicare Other | Source: Ambulatory Visit | Attending: Family Medicine | Admitting: Family Medicine

## 2015-08-22 ENCOUNTER — Other Ambulatory Visit: Payer: Self-pay | Admitting: Family Medicine

## 2015-08-22 DIAGNOSIS — Z1231 Encounter for screening mammogram for malignant neoplasm of breast: Secondary | ICD-10-CM

## 2015-08-24 ENCOUNTER — Other Ambulatory Visit: Payer: Self-pay | Admitting: Family Medicine

## 2015-10-08 ENCOUNTER — Encounter: Payer: Self-pay | Admitting: Family Medicine

## 2015-10-08 ENCOUNTER — Ambulatory Visit (INDEPENDENT_AMBULATORY_CARE_PROVIDER_SITE_OTHER): Payer: Medicare Other | Admitting: Family Medicine

## 2015-10-08 VITALS — BP 110/70 | HR 64 | Temp 97.7°F | Wt 212.0 lb

## 2015-10-08 DIAGNOSIS — M25511 Pain in right shoulder: Secondary | ICD-10-CM | POA: Diagnosis not present

## 2015-10-08 DIAGNOSIS — M791 Myalgia, unspecified site: Secondary | ICD-10-CM | POA: Insufficient documentation

## 2015-10-08 DIAGNOSIS — M7918 Myalgia, other site: Secondary | ICD-10-CM

## 2015-10-08 NOTE — Progress Notes (Signed)
Pre visit review using our clinic review tool, if applicable. No additional management support is needed unless otherwise documented below in the visit note. 

## 2015-10-08 NOTE — Patient Instructions (Signed)

## 2015-10-08 NOTE — Progress Notes (Signed)
Dr. Karleen HampshireSpencer T. Venus Gilles, MD, CAQ Sports Medicine Primary Care and Sports Medicine [address] [place], [id number] Phone: [phone] Fax: [phone]  10/08/2015  Patient: Denise Parker, MRN: [id number], DOB: 1950-03-15, 65 y.o.  Primary Physician:  Ruthe Mannanalia Aron, MD   Chief Complaint  Patient presents with  . Shoulder Pain    Right Shoulder pain started about 1.5 weeks ago   Subjective:   Denise Parker is a 65 y.o. 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   Occurring more frequently now. Normally heat , thermacare heat patches, ben gay or a heating pad, it will relieve and now a very wide area.   Motrin also not helping.  Goes up all the way up on the R side of the spine.  Pulling and tightening sensation.   Past Medical History, Surgical History, Social History, Family History, Problem List, Medications, and Allergies have been reviewed and updated if relevant.  Patient Active Problem List   Diagnosis Date Noted  . Well woman exam 06/20/2015  . Arthritis 06/20/2015  . Encounter for routine gynecological examination 06/20/2015  . HLD (hyperlipidemia) 06/20/2015  . Herpes zoster 04/13/2013  . Metatarsalgia 09/27/2012  . Allergic rhinitis 06/16/2011    Past Medical History:  Diagnosis Date  . HLD (hyperlipidemia)     Past Surgical History:  Procedure Laterality Date  . BREAST CYST ASPIRATION    . TONSILLECTOMY      Social History   Social History  . Marital status: Married    Spouse name: N/A  . Number of children: N/A  . Years of education: N/A   Occupational History  . Not on file.   Social History Main Topics  . Smoking status: Never Smoker  . Smokeless tobacco: Never Used  . Alcohol use 1.8 oz/week    3 Glasses of wine per week     Comment: Wine-regular  . Drug use: No  . Sexual activity: Yes   Other Topics Concern  . Not on file   Social  History Narrative   [place] from [place] to [place] every two weeks.   Speech therapist, works with children.   Has one 67 yo son in [place] and 4 grandchildren.   Does not have a living will or HPOA   Desires CPR, would not want prolonged life support if futile.       Family History  Problem Relation Age of Onset  . Heart disease Mother   . Heart disease Father   . Breast cancer Sister [id number]    No Known Allergies  Medication list reviewed and updated in full in [facility].  GEN: No fevers, chills. Nontoxic. Primarily MSK c/o today. MSK: Detailed in the HPI GI: tolerating PO intake without difficulty Neuro: No numbness, parasthesias, or tingling associated. Otherwise the pertinent positives of the ROS are noted above.   Objective:   BP 110/70 (BP Location: Left Arm, Patient Position: Sitting, Cuff Size: Normal)   Pulse 64   Temp 97.7 F (36.5 C) (Oral)   Wt 212 lb (96.2 kg)   SpO2 98%   BMI 36.68 kg/m    GEN: WDWN, NAD, Non-toxic, Alert & Oriented x 3 HEENT: Atraumatic, Normocephalic.  Ears and Nose: No external deformity. EXTR: No clubbing/cyanosis/edema NEURO: Normal gait.  PSYCH: Normally interactive. Conversant. Not depressed or anxious appearing.  Calm demeanor.   Shoulder: R Inspection: No muscle wasting or winging Ecchymosis/edema: neg  AC  joint, scapula, clavicle: NT Cervical spine: NT, full ROM TTP along entire trap Spurling's: neg Abduction: full, 5/5 Flexion: full, 5/5 IR, full, lift-off: 5/5 ER at neutral: full, 5/5 AC crossover and compression: neg Neer: neg Hawkins: neg Drop Test: neg Empty Can: neg Supraspinatus insertion: NT Bicipital groove: NT Speed's: neg Yergason's: neg Sulcus sign: neg Scapular dyskinesis: none C5-T1 intact Sensation intact Grip 5/5   Radiology: No results found.  Assessment and Plan:   Myofascial pain syndrome - Plan: Ambulatory referral to Physical Therapy  Right shoulder pain - Plan: Ambulatory  referral to Physical Therapy  >25 minutes spent in face to face time with patient, >50% spent in counselling or coordination of care   History and exam not consistent with shoulder pathology. Consistent with myofascial pain syndrome. Right-sided, along trap. PT to assist.  Cont heat, massage at home  Follow-up: prn  Orders Placed This Encounter  Procedures  . Ambulatory referral to Physical Therapy    Signed,  Karleen HampshireSpencer T. Ranetta Armacost, MD   Patient's Medications  New Prescriptions   No medications on file  Previous Medications   ATORVASTATIN (LIPITOR) 20 MG TABLET    TAKE 1 TABLET BY MOUTH EVERY DAY   CALCIUM-VITAMIN D PO    Take 600 mg of calcium and 800 units of vitamin D daily   CETIRIZINE (ZYRTEC) 5 MG TABLET    Take 5 mg by mouth daily.   CRANBERRY-VITAMIN C PO    Take by mouth daily.   FLAXSEED, LINSEED, (FLAXSEED OIL PO)    Take 1 capsule by mouth.   FLUTICASONE (FLONASE) 50 MCG/ACT NASAL SPRAY    Place 1 spray into both nostrils daily.   GLUCOSAMINE CHONDROITIN COMPLX PO    Take by mouth.   IBUPROFEN (ADVIL,MOTRIN) 200 MG TABLET    Take 400 mg by mouth every 6 (six) hours as needed.   TURMERIC 500 MG TABS    Take 500 mg by mouth.   VITAMIN C (ASCORBIC ACID) 250 MG TABLET    Take 250 mg by mouth daily.   VITAMIN E 400 UNIT CAPSULE    Take 400 Units by mouth daily.  Modified Medications   No medications on file  Discontinued Medications   No medications on file

## 2015-10-10 DIAGNOSIS — M25511 Pain in right shoulder: Secondary | ICD-10-CM | POA: Diagnosis not present

## 2015-10-19 DIAGNOSIS — M25511 Pain in right shoulder: Secondary | ICD-10-CM | POA: Diagnosis not present

## 2015-10-22 DIAGNOSIS — M25511 Pain in right shoulder: Secondary | ICD-10-CM | POA: Diagnosis not present

## 2015-10-24 DIAGNOSIS — M25511 Pain in right shoulder: Secondary | ICD-10-CM | POA: Diagnosis not present

## 2015-10-31 DIAGNOSIS — M25511 Pain in right shoulder: Secondary | ICD-10-CM | POA: Diagnosis not present

## 2015-11-02 DIAGNOSIS — M25511 Pain in right shoulder: Secondary | ICD-10-CM | POA: Diagnosis not present

## 2015-11-07 DIAGNOSIS — M25511 Pain in right shoulder: Secondary | ICD-10-CM | POA: Diagnosis not present

## 2015-11-09 DIAGNOSIS — M25511 Pain in right shoulder: Secondary | ICD-10-CM | POA: Diagnosis not present

## 2015-11-15 DIAGNOSIS — M25511 Pain in right shoulder: Secondary | ICD-10-CM | POA: Diagnosis not present

## 2015-11-16 DIAGNOSIS — M25511 Pain in right shoulder: Secondary | ICD-10-CM | POA: Diagnosis not present

## 2015-11-21 DIAGNOSIS — M25511 Pain in right shoulder: Secondary | ICD-10-CM | POA: Diagnosis not present

## 2015-11-28 DIAGNOSIS — M25511 Pain in right shoulder: Secondary | ICD-10-CM | POA: Diagnosis not present

## 2016-02-08 ENCOUNTER — Ambulatory Visit (INDEPENDENT_AMBULATORY_CARE_PROVIDER_SITE_OTHER): Payer: Medicare Other

## 2016-02-08 DIAGNOSIS — Z23 Encounter for immunization: Secondary | ICD-10-CM

## 2016-07-03 ENCOUNTER — Ambulatory Visit: Payer: Private Health Insurance - Indemnity | Admitting: Family Medicine

## 2016-07-07 ENCOUNTER — Ambulatory Visit (INDEPENDENT_AMBULATORY_CARE_PROVIDER_SITE_OTHER): Payer: Medicare Other

## 2016-07-07 ENCOUNTER — Ambulatory Visit (INDEPENDENT_AMBULATORY_CARE_PROVIDER_SITE_OTHER)
Admission: RE | Admit: 2016-07-07 | Discharge: 2016-07-07 | Disposition: A | Discharge status: Home / Self Care | Payer: Medicare Other | Source: Ambulatory Visit | Attending: Family Medicine | Admitting: Family Medicine

## 2016-07-07 ENCOUNTER — Other Ambulatory Visit: Payer: Self-pay | Admitting: Family Medicine

## 2016-07-07 ENCOUNTER — Encounter: Payer: Self-pay | Admitting: Family Medicine

## 2016-07-07 ENCOUNTER — Ambulatory Visit: Payer: Private Health Insurance - Indemnity | Admitting: Family Medicine

## 2016-07-07 ENCOUNTER — Ambulatory Visit (INDEPENDENT_AMBULATORY_CARE_PROVIDER_SITE_OTHER): Payer: Medicare Other | Admitting: Family Medicine

## 2016-07-07 VITALS — BP 104/66 | HR 59 | Temp 97.8°F | Ht 64.0 in | Wt 217.2 lb

## 2016-07-07 DIAGNOSIS — M5440 Lumbago with sciatica, unspecified side: Secondary | ICD-10-CM | POA: Insufficient documentation

## 2016-07-07 DIAGNOSIS — Z0000 Encounter for general adult medical examination without abnormal findings: Secondary | ICD-10-CM | POA: Diagnosis not present

## 2016-07-07 DIAGNOSIS — M545 Low back pain: Secondary | ICD-10-CM | POA: Diagnosis not present

## 2016-07-07 DIAGNOSIS — E785 Hyperlipidemia, unspecified: Secondary | ICD-10-CM | POA: Diagnosis not present

## 2016-07-07 DIAGNOSIS — M5136 Other intervertebral disc degeneration, lumbar region: Secondary | ICD-10-CM

## 2016-07-07 DIAGNOSIS — E2839 Other primary ovarian failure: Secondary | ICD-10-CM | POA: Diagnosis not present

## 2016-07-07 DIAGNOSIS — Z01419 Encounter for gynecological examination (general) (routine) without abnormal findings: Secondary | ICD-10-CM

## 2016-07-07 DIAGNOSIS — M419 Scoliosis, unspecified: Secondary | ICD-10-CM

## 2016-07-07 LAB — COMPREHENSIVE METABOLIC PANEL
ALT: 16 U/L (ref 0–35)
AST: 18 U/L (ref 0–37)
Albumin: 4.3 g/dL (ref 3.5–5.2)
Alkaline Phosphatase: 52 U/L (ref 39–117)
BILIRUBIN TOTAL: 0.5 mg/dL (ref 0.2–1.2)
BUN: 12 mg/dL (ref 6–23)
CO2: 29 meq/L (ref 19–32)
Calcium: 9.2 mg/dL (ref 8.4–10.5)
Chloride: 106 mEq/L (ref 96–112)
Creatinine, Ser: 0.67 mg/dL (ref 0.40–1.20)
GFR: 93.18 mL/min (ref >60.00)
GLUCOSE: 103 mg/dL — AB (ref 70–99)
POTASSIUM: 4.1 meq/L (ref 3.5–5.1)
SODIUM: 141 meq/L (ref 135–145)
TOTAL PROTEIN: 7.2 g/dL (ref 6.0–8.3)

## 2016-07-07 LAB — LIPID PANEL
CHOL/HDL RATIO: 3
Cholesterol: 211 mg/dL — ABNORMAL HIGH (ref 0–200)
HDL: 66 mg/dL (ref >39.00)
LDL Cholesterol: 132 mg/dL — ABNORMAL HIGH (ref 0–99)
NONHDL: 144.57
Triglycerides: 65 mg/dL (ref 0.0–149.0)
VLDL: 13 mg/dL (ref 0.0–40.0)

## 2016-07-07 LAB — TSH: TSH: 2.32 u[IU]/mL (ref 0.35–4.50)

## 2016-07-07 NOTE — Progress Notes (Signed)
SUBJECTIVE:  Levert Feinsteinatricia Ann Mcdevitt is a 66 y.o. female who complains of low back pain for 3 week(s), positional with bending or lifting, with radiation down the legs. Precipitating factors: none recalled by the patient. Prior history of back problems: recurrent self limited episodes of low back pain in the past. There is no numbness in the legs.  She did take 500 mg Naproxen daily for two weeks which did help.   Current Outpatient Prescriptions on File Prior to Visit  Medication Sig Dispense Refill  . atorvastatin (LIPITOR) 20 MG tablet TAKE 1 TABLET BY MOUTH EVERY DAY 90 tablet 0  . CALCIUM-VITAMIN D PO Take 600 mg of calcium and 800 units of vitamin D daily    . cetirizine (ZYRTEC) 5 MG tablet Take 10 mg by mouth daily.     Marland Kitchen. CRANBERRY-VITAMIN C PO Take by mouth daily.    . Flaxseed, Linseed, (FLAXSEED OIL PO) Take 1,000 mg by mouth daily.     . fluticasone (FLONASE) 50 MCG/ACT nasal spray Place 1 spray into both nostrils daily.    Marland Kitchen. GLUCOSAMINE CHONDROITIN COMPLX PO Take by mouth.    Marland Kitchen. ibuprofen (ADVIL,MOTRIN) 200 MG tablet Take 400 mg by mouth every 6 (six) hours as needed.    . Turmeric 500 MG TABS Take 500 mg by mouth.    . vitamin C (ASCORBIC ACID) 250 MG tablet Take 250 mg by mouth daily.    . vitamin E 400 UNIT capsule Take 400 Units by mouth daily.     No current facility-administered medications on file prior to visit.     No Known Allergies  Past Medical History:  Diagnosis Date  . HLD (hyperlipidemia)     Past Surgical History:  Procedure Laterality Date  . BREAST CYST ASPIRATION    . TONSILLECTOMY      Family History  Problem Relation Age of Onset  . Heart disease Mother   . Heart disease Father   . Breast cancer Sister [id number]    Social History   Social History  . Marital status: Married    Spouse name: N/A  . Number of children: N/A  . Years of education: N/A   Occupational History  . Not on file.   Social History Main Topics  . Smoking status: Never  Smoker  . Smokeless tobacco: Never Used  . Alcohol use 1.8 oz/week    3 Glasses of wine per week     Comment: Wine-regular  . Drug use: No  . Sexual activity: Yes   Other Topics Concern  . Not on file   Social History Narrative   [place] from [place] to [place] every two weeks.   Speech therapist, works with children.   Has one 135 yo son in [place] and 4 grandchildren.   Does not have a living will or HPOA   Desires CPR, would not want prolonged life support if futile.      The PMH, PSH, Social History, Family History, Medications, and allergies have been reviewed in [facility], and have been updated if relevant.  OBJECTIVE:  BP 104/66 (BP Location: Right Arm, Patient Position: Sitting, Cuff Size: Large)   Pulse (!) 59   Temp 97.8 F (36.6 C) (Oral)   Ht 5\' 4"  (1.626 m) Comment: no shoes  Wt 217 lb 4 oz (98.5 kg)   SpO2 98%   BMI 37.29 kg/m   Patient appears to be in mild to moderate pain, antalgic gait noted. Lumbosacral spine area reveals no local tenderness  or mass.  Painful and reduced LS ROM noted. Straight leg raise is positive at 90 degrees on right. DTR's, motor strength and sensation normal, including heel and toe gait.  Peripheral pulses are palpable. X-Ray: ordered, but results not yet available.  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 For acute pain, rest, intermittent application of heat (do not sleep on heating pad), analgesics and muscle relaxants are recommended. Discussed longer term treatment plan of prn NSAID's and discussed a home back care exercise program with flexion exercise routine. Proper lifting with avoidance of heavy lifting discussed.   Call or return to clinic prn if these symptoms worsen or fail to improve as anticipated.

## 2016-07-07 NOTE — Patient Instructions (Signed)
Great to see you.  I will call you with your xray results. 

## 2016-07-07 NOTE — Progress Notes (Signed)
Pre visit review using our clinic review tool, if applicable. No additional management support is needed unless otherwise documented below in the visit note. 

## 2016-07-07 NOTE — Progress Notes (Signed)
PCP notes:   Health maintenance:  Bone density - order placed; PCP approval needed  Abnormal screenings:   Hearing - failed   Patient concerns:   Pain - lower back, bilateral hips. Pain scale: 4/10. Pt reports pain is barrier to exercise and sleep management.   Audiology referral - pt states she is experiencing hearing loss evidenced by inability to follow conversations in a crowded room and turning up TV volume.  Nurse concerns:  None  Next PCP appt:   07/07/16 @ 1100; CPE 07/15/16 @ 1215

## 2016-07-07 NOTE — Patient Instructions (Signed)
[name] , Thank you for taking time to come for your Medicare Wellness Visit. I appreciate your ongoing commitment to your health goals. Please review the following plan we discussed and let me know if I can assist you in the future.   These are the goals we discussed: Goals    . Increase physical activity          Starting 07/07/2016, I will continue to exercise for at least 45 min 3-4 days per week.         This is a list of the screening recommended for you and due dates:  Health Maintenance  Topic Date Due  . DEXA scan (bone density measurement)  07/07/2017*  . Mammogram  08/21/2016  . Flu Shot  09/24/2016  . Pneumonia vaccines (2 of 2 - PPSV23) 11/23/2017  . Colon Cancer Screening  01/24/2018  . Tetanus Vaccine  01/24/2021  .  Hepatitis C: One time screening is recommended by Center for Disease Control  (CDC) for  adults born from [birth year] through 1965.   Completed  *Topic was postponed. The date shown is not the original due date.   Preventive Care for Adults  A healthy lifestyle and preventive care can promote health and wellness. Preventive health guidelines for adults include the following key practices.  . A routine yearly physical is a good way to check with your health care provider about your health and preventive screening. It is a chance to share any concerns and updates on your health and to receive a thorough exam.  . Visit your dentist for a routine exam and preventive care every 6 months. Brush your teeth twice a day and floss once a day. Good oral hygiene prevents tooth decay and gum disease.  . The frequency of eye exams is based on your age, health, family medical history, use  of contact lenses, and other factors. Follow your health care provider's ecommendations for frequency of eye exams.  . Eat a healthy diet. Foods like vegetables, fruits, whole grains, low-fat dairy products, and lean protein foods contain the nutrients you need without too many calories.  Decrease your intake of foods high in solid fats, added sugars, and salt. Eat the right amount of calories for you. Get information about a proper diet from your health care provider, if necessary.  . Regular physical exercise is one of the most important things you can do for your health. Most adults should get at least 150 minutes of moderate-intensity exercise (any activity that increases your heart rate and causes you to sweat) each week. In addition, most adults need muscle-strengthening exercises on 2 or more days a week.  Silver Sneakers may be a benefit available to you. To determine eligibility, you may visit the website: www.silversneakers.com or contact program at [phone] Mon-Fri between 8AM-8PM.   . Maintain a healthy weight. The body mass index (BMI) is a screening tool to identify possible weight problems. It provides an estimate of body fat based on height and weight. Your health care provider can find your BMI and can help you achieve or maintain a healthy weight.   For adults 20 years and older: ? A BMI below 18.5 is considered underweight. ? A BMI of 18.5 to 24.9 is normal. ? A BMI of 25 to 29.9 is considered overweight. ? A BMI of 30 and above is considered obese.   . Maintain normal blood lipids and cholesterol levels by exercising and minimizing your intake of saturated fat. Eat a  balanced diet with plenty of fruit and vegetables. Blood tests for lipids and cholesterol should begin at age 30 and be repeated every 5 years. If your lipid or cholesterol levels are high, you are over 50, or you are at high risk for heart disease, you may need your cholesterol levels checked more frequently. Ongoing high lipid and cholesterol levels should be treated with medicines if diet and exercise are not working.  . If you smoke, find out from your health care provider how to quit. If you do not use tobacco, please do not start.  . If you choose to drink alcohol, please do not consume  more than 2 drinks per day. One drink is considered to be 12 ounces (355 mL) of beer, 5 ounces (148 mL) of wine, or 1.5 ounces (44 mL) of liquor.  . If you are 30-53 years old, ask your health care provider if you should take aspirin to prevent strokes.  . Use sunscreen. Apply sunscreen liberally and repeatedly throughout the day. You should seek shade when your shadow is shorter than you. Protect yourself by wearing long sleeves, pants, a wide-brimmed hat, and sunglasses year round, whenever you are outdoors.  . Once a month, do a whole body skin exam, using a mirror to look at the skin on your back. Tell your health care provider of new moles, moles that have irregular borders, moles that are larger than a pencil eraser, or moles that have changed in shape or color.

## 2016-07-07 NOTE — Progress Notes (Signed)
I reviewed health advisor's note, was available for consultation, and agree with documentation and plan.  

## 2016-07-07 NOTE — Progress Notes (Signed)
Subjective:   Denise Feinsteinatricia Ann Bassin is a 66 y.o. female who presents for Medicare Annual (Subsequent) preventive examination.  Review of Systems:  N/A Cardiac Risk Factors include: advanced age (>7855men, 60>65 women);obesity (BMI >30kg/m2);dyslipidemia     Objective:     Vitals: BP 104/66 (BP Location: Right Arm, Patient Position: Sitting, Cuff Size: Large)   Pulse (!) 59   Temp 97.8 F (36.6 C) (Oral)   Ht 5\' 4"  (1.626 m) Comment: no shoes  Wt 217 lb 4 oz (98.5 kg)   SpO2 98%   BMI 37.29 kg/m   Body mass index is 37.29 kg/m.   Tobacco History  Smoking Status  . Never Smoker  Smokeless Tobacco  . Never Used     Counseling given: No   Past Medical History:  Diagnosis Date  . HLD (hyperlipidemia)    Past Surgical History:  Procedure Laterality Date  . BREAST CYST ASPIRATION    . TONSILLECTOMY     Family History  Problem Relation Age of Onset  . Heart disease Mother   . Heart disease Father   . Breast cancer Sister [id number]   History  Sexual Activity  . Sexual activity: Yes    Outpatient Encounter Prescriptions as of 07/07/2016  Medication Sig  . atorvastatin (LIPITOR) 20 MG tablet TAKE 1 TABLET BY MOUTH EVERY DAY  . CALCIUM-VITAMIN D PO Take 600 mg of calcium and 800 units of vitamin D daily  . cetirizine (ZYRTEC) 5 MG tablet Take 10 mg by mouth daily.   Marland Kitchen. CRANBERRY-VITAMIN C PO Take by mouth daily.  . Flaxseed, Linseed, (FLAXSEED OIL PO) Take 1,000 mg by mouth daily.   . fluticasone (FLONASE) 50 MCG/ACT nasal spray Place 1 spray into both nostrils daily.  Marland Kitchen. GLUCOSAMINE CHONDROITIN COMPLX PO Take by mouth.  Marland Kitchen. ibuprofen (ADVIL,MOTRIN) 200 MG tablet Take 400 mg by mouth every 6 (six) hours as needed.  . Turmeric 500 MG TABS Take 500 mg by mouth.  . vitamin C (ASCORBIC ACID) 250 MG tablet Take 250 mg by mouth daily.  . vitamin E 400 UNIT capsule Take 400 Units by mouth daily.   No facility-administered encounter medications on file as of 07/07/2016.      Activities of Daily Living In your present state of health, do you have any difficulty performing the following activities: 07/07/2016  Hearing? Y  Vision? N  Difficulty concentrating or making decisions? N  Walking or climbing stairs? Y  Dressing or bathing? N  Doing errands, shopping? N  Preparing Food and eating ? N  Using the Toilet? N  In the past six months, have you accidently leaked urine? N  Do you have problems with loss of bowel control? N  Managing your Medications? N  Managing your Finances? N  Housekeeping or managing your Housekeeping? N  Some recent data might be hidden    Patient Care Team: Dianne DunAron, Talia M, MD as PCP - General (Family Medicine) Lemar LoftySydnor, Charles, MD as Consulting Physician (Unknown Physician Specialty)    Assessment:     Hearing Screening   125Hz  250Hz  500Hz  1000Hz  2000Hz  3000Hz  4000Hz  6000Hz  8000Hz   Right ear:   0 0 40  0    Left ear:   0 0 40  0    Vision Screening Comments: Last vision exam in 2017 with Dr. Cheryll CockayneSyndor   Exercise Activities and Dietary recommendations Current Exercise Habits: Home exercise routine, Type of exercise: walking, Time (Minutes): 45, Frequency (Times/Week): 3, Weekly Exercise (Minutes/Week): 135, Intensity:  Mild, Exercise limited by: None identified  Goals    . Increase physical activity          Starting 07/07/2016, I will continue to exercise for at least 45 min 3-4 days per week.        Fall Risk Fall Risk  07/07/2016 06/13/2015 01/24/2014  Falls in the past year? No No -  Number falls in past yr: - - 1   Depression Screen PHQ 2/9 Scores 07/07/2016 06/13/2015 01/24/2014  PHQ - 2 Score 0 0 0     Cognitive Function MMSE - Mini Mental State Exam 07/07/2016 06/13/2015  Orientation to time 5 5  Orientation to Place 5 5  Registration 3 3  Attention/ Calculation 0 0  Recall 3 3  Language- name 2 objects 0 0  Language- repeat 1 1  Language- follow 3 step command 3 3  Language- read & follow direction 0 0   Write a sentence 0 0  Copy design 0 0  Total score 20 20     PLEASE NOTE: A Mini-Cog screen was completed. Maximum score is 20. A value of 0 denotes this part of Folstein MMSE was not completed or the patient failed this part of the Mini-Cog screening.   Mini-Cog Screening Orientation to Time - Max 5 pts Orientation to Place - Max 5 pts Registration - Max 3 pts Recall - Max 3 pts Language Repeat - Max 1 pts Language Follow 3 Step Command - Max 3 pts     Immunization History  Administered Date(s) Administered  . Influenza,inj,Quad PF,36+ Mos 02/08/2016  . Influenza-Unspecified 12/08/2013  . Pneumococcal Conjugate-13 01/24/2014  . Pneumococcal-Unspecified 11/23/2012  . Zoster 06/16/2011   Screening Tests Health Maintenance  Topic Date Due  . DEXA SCAN  07/07/2017 (Originally 01/21/2014)  . MAMMOGRAM  08/21/2016  . INFLUENZA VACCINE  09/24/2016  . PNA vac Low Risk Adult (2 of 2 - PPSV23) 11/23/2017  . COLONOSCOPY  01/24/2018  . TETANUS/TDAP  01/24/2021  . Hepatitis C Screening  Completed      Plan:     I have personally reviewed and addressed the Medicare Annual Wellness questionnaire and have noted the following in the patient's chart:  A. Medical and social history B. Use of alcohol, tobacco or illicit drugs  C. Current medications and supplements D. Functional ability and status E.  Nutritional status F.  Physical activity G. Advance directives H. List of other physicians I.  Hospitalizations, surgeries, and ER visits in previous 12 months J.  Vitals K. Screenings to include hearing, vision, cognitive, depression L. Referrals and appointments - none  In addition, I have reviewed and discussed with patient certain preventive protocols, quality metrics, and best practice recommendations. A written personalized care plan for preventive services as well as general preventive health recommendations were provided to patient.  See attached scanned questionnaire for  additional information.   Signed,   Randa Evens, MHA, BS, LPN Health Coach

## 2016-07-15 ENCOUNTER — Ambulatory Visit (INDEPENDENT_AMBULATORY_CARE_PROVIDER_SITE_OTHER): Payer: Medicare Other | Admitting: Family Medicine

## 2016-07-15 ENCOUNTER — Other Ambulatory Visit: Payer: Self-pay | Admitting: Family Medicine

## 2016-07-15 ENCOUNTER — Encounter: Payer: Self-pay | Admitting: Family Medicine

## 2016-07-15 VITALS — BP 120/60 | HR 64 | Temp 97.7°F | Ht 64.0 in | Wt 218.0 lb

## 2016-07-15 DIAGNOSIS — M545 Low back pain: Secondary | ICD-10-CM | POA: Diagnosis not present

## 2016-07-15 DIAGNOSIS — Z1231 Encounter for screening mammogram for malignant neoplasm of breast: Secondary | ICD-10-CM

## 2016-07-15 DIAGNOSIS — J301 Allergic rhinitis due to pollen: Secondary | ICD-10-CM | POA: Diagnosis not present

## 2016-07-15 DIAGNOSIS — E785 Hyperlipidemia, unspecified: Secondary | ICD-10-CM | POA: Diagnosis not present

## 2016-07-15 DIAGNOSIS — G8929 Other chronic pain: Secondary | ICD-10-CM | POA: Insufficient documentation

## 2016-07-15 DIAGNOSIS — M549 Dorsalgia, unspecified: Secondary | ICD-10-CM | POA: Insufficient documentation

## 2016-07-15 DIAGNOSIS — H9190 Unspecified hearing loss, unspecified ear: Secondary | ICD-10-CM | POA: Insufficient documentation

## 2016-07-15 MED ORDER — FLUTICASONE PROPIONATE 50 MCG/ACT NA SUSP: 1.0000 | Freq: Every day | NASAL | 6 refills | Status: DC

## 2016-07-15 MED ORDER — CETIRIZINE HCL 5 MG PO TABS: 10.0000 mg | ORAL_TABLET | Freq: Every day | ORAL | 6 refills | Status: DC

## 2016-07-15 NOTE — Assessment & Plan Note (Signed)
Deteriorated a bit but still reasonable control. No changes made to rxs.

## 2016-07-15 NOTE — Progress Notes (Signed)
Pre visit review using our clinic review tool, if applicable. No additional management support is needed unless otherwise documented below in the visit note. 

## 2016-07-15 NOTE — Assessment & Plan Note (Signed)
Awaiting PT and ortho appointments.

## 2016-07-15 NOTE — Progress Notes (Signed)
Subjective:   Patient ID: Denise Parker, female    DOB: [birth year], 66 y.o.   MRN: [id number]  Denise Parker is a pleasant 66 y.o. year old female who presents to clinic today with Annual Exam (back issues, rx for zyrtec and flonase. hearing has decreased requesting a referral to audiology. )  on 07/15/2016  HPI: Medicare annual wellness visit already completed by Lu Duffel on 07/07/16.  Notes reviewed today.  Back pain- xray done last week showed some scoliosis and significant DDD of her spine.  Referred her to PT and ortho.  Failed hearing screen at [facility].  She has noticed that she is having a harder time following conversations.  HLD- lipids well controlled with lipitor 20 mg daily and flaxseed oil. Lab Results  Component Value Date   CHOL 211 (H) 07/07/2016   HDL 66.00 07/07/2016   LDLCALC 132 (H) 07/07/2016   LDLDIRECT 171.8 09/27/2012   TRIG 65.0 07/07/2016   CHOLHDL 3 07/07/2016   Lab Results  Component Value Date   CREATININE 0.67 07/07/2016   Lab Results  Component Value Date   NA 141 07/07/2016   K 4.1 07/07/2016   CL 106 07/07/2016   CO2 29 07/07/2016   Lab Results  Component Value Date   TSH 2.32 07/07/2016   Current Outpatient Prescriptions on File Prior to Visit  Medication Sig Dispense Refill  . atorvastatin (LIPITOR) 20 MG tablet TAKE 1 TABLET BY MOUTH EVERY DAY 90 tablet 0  . CALCIUM-VITAMIN D PO Take 600 mg of calcium and 800 units of vitamin D daily    . CRANBERRY-VITAMIN C PO Take by mouth daily.    . Flaxseed, Linseed, (FLAXSEED OIL PO) Take 1,000 mg by mouth daily.     Marland Kitchen GLUCOSAMINE CHONDROITIN COMPLX PO Take by mouth.    Marland Kitchen ibuprofen (ADVIL,MOTRIN) 200 MG tablet Take 400 mg by mouth every 6 (six) hours as needed.    . Turmeric 500 MG TABS Take 500 mg by mouth.    . vitamin C (ASCORBIC ACID) 250 MG tablet Take 250 mg by mouth daily.    . vitamin E 400 UNIT capsule Take 400 Units by mouth daily.     No current  facility-administered medications on file prior to visit.     No Known Allergies  Past Medical History:  Diagnosis Date  . HLD (hyperlipidemia)     Past Surgical History:  Procedure Laterality Date  . BREAST CYST ASPIRATION    . TONSILLECTOMY      Family History  Problem Relation Age of Onset  . Heart disease Mother   . Heart disease Father   . Breast cancer Sister 48    Social History   Social History  . Marital status: Married    Spouse name: N/A  . Number of children: N/A  . Years of education: N/A   Occupational History  . Not on file.   Social History Main Topics  . Smoking status: Never Smoker  . Smokeless tobacco: Never Used  . Alcohol use 1.8 oz/week    3 Glasses of wine per week     Comment: Wine-regular  . Drug use: No  . Sexual activity: Yes   Other Topics Concern  . Not on file   Social History Narrative   Commuting from [place] to [place] every two weeks.   Speech therapist, works with children.   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   Does not have a  living will or HPOA   Desires CPR, would not want prolonged life support if futile.      The PMH, PSH, Social History, Family History, Medications, and allergies have been reviewed [facility], and have been updated if relevant.   Review of Systems  Constitutional: Negative.   HENT: Negative.   Eyes: Negative.   Respiratory: Negative.   Cardiovascular: Negative.   Gastrointestinal: Negative.   Endocrine: Negative.   Genitourinary: Negative.   Musculoskeletal: Positive for joint swelling.  Allergic/Immunologic: Negative.   Neurological: Negative.   Hematological: Negative.   Psychiatric/Behavioral: Negative.   All other systems reviewed and are negative.      Objective:    BP 120/60   Pulse 64   Temp 97.7 F (36.5 C)   Ht 5\' 4"  (1.626 m)   Wt 218 lb (98.9 kg)   SpO2 98%   BMI 37.42 kg/m    Physical Exam   General:  Well-developed,well-nourished,in no acute distress;  alert,appropriate and cooperative throughout examination Head:  normocephalic and atraumatic.   Eyes:  vision grossly intact, pupils equal, pupils round, and pupils reactive to light.   Ears:  R ear normal and L ear normal.   Nose:  no external deformity.   Mouth:  good dentition.   Neck:  No deformities, masses, or tenderness noted. Lungs:  Normal respiratory effort, chest expands symmetrically. Lungs are clear to auscultation, no crackles or wheezes. Heart:  Normal rate and regular rhythm. S1 and S2 normal without gallop, murmur, click, rub or other extra sounds. Abdomen:  Bowel sounds positive,abdomen soft and non-tender without masses, organomegaly or hernias noted. Msk:  No deformity or scoliosis noted of thoracic or lumbar spine.   Extremities:  Enlarged DIPS and PIPs bilaterally Neurologic:  alert & oriented X3 and gait normal.   Skin:  Intact without suspicious lesions or rashes Cervical Nodes:  No lymphadenopathy noted Axillary Nodes:  No palpable lymphadenopathy Psych:  Cognition and judgment appear intact. Alert and cooperative with normal attention span and concentration. No apparent delusions, illusions, hallucinations       Assessment & Plan:   Hyperlipidemia, unspecified hyperlipidemia type  Low back pain, unspecified back pain laterality, unspecified chronicity, with sciatica presence unspecified  Decreased hearing, unspecified laterality - Plan: Ambulatory referral to Audiology No Follow-up on file.

## 2016-07-15 NOTE — Assessment & Plan Note (Signed)
Zyrtec and flonase eRx refill sent.

## 2016-07-15 NOTE — Assessment & Plan Note (Signed)
Audiology referral placed.

## 2016-07-23 ENCOUNTER — Encounter: Payer: Self-pay | Admitting: Family Medicine

## 2016-07-23 DIAGNOSIS — M25552 Pain in left hip: Secondary | ICD-10-CM | POA: Diagnosis not present

## 2016-07-23 DIAGNOSIS — Z1211 Encounter for screening for malignant neoplasm of colon: Secondary | ICD-10-CM | POA: Diagnosis not present

## 2016-07-23 DIAGNOSIS — M545 Low back pain: Secondary | ICD-10-CM | POA: Diagnosis not present

## 2016-07-23 DIAGNOSIS — M25551 Pain in right hip: Secondary | ICD-10-CM | POA: Diagnosis not present

## 2016-07-29 ENCOUNTER — Encounter: Payer: Self-pay | Admitting: Physical Therapy

## 2016-07-29 ENCOUNTER — Ambulatory Visit: Payer: Medicare Other | Attending: Family Medicine | Admitting: Physical Therapy

## 2016-07-29 DIAGNOSIS — G8929 Other chronic pain: Secondary | ICD-10-CM | POA: Diagnosis not present

## 2016-07-29 DIAGNOSIS — M5440 Lumbago with sciatica, unspecified side: Secondary | ICD-10-CM | POA: Insufficient documentation

## 2016-07-29 DIAGNOSIS — R262 Difficulty in walking, not elsewhere classified: Secondary | ICD-10-CM

## 2016-07-29 DIAGNOSIS — M6281 Muscle weakness (generalized): Secondary | ICD-10-CM

## 2016-07-29 LAB — COLOGUARD

## 2016-07-29 NOTE — Therapy (Signed)
[facility] [address] [place], [place], [address] Phone: [phone]   Fax:  [phone]  Physical Therapy Evaluation  Patient Details  Name: Denise Parker MRN: [id number] Date of Birth: 1950-10-10 Referring Provider: Dr. Dayton Martes, Dr. Cleophas Dunker   Encounter Date: 07/29/2016      PT End of Session - 07/29/16 1944    Visit Number 1   Number of Visits 16   Date for PT Re-Evaluation 09/23/16   PT Start Time 1332   PT Stop Time 1423   PT Time Calculation (min) 51 min   Activity Tolerance Patient tolerated treatment well   Behavior During Therapy [facility] for tasks assessed/performed      Past Medical History:  Diagnosis Date  . HLD (hyperlipidemia)     Past Surgical History:  Procedure Laterality Date  . BREAST CYST ASPIRATION    . TONSILLECTOMY      There were no vitals filed for this visit.       Subjective Assessment - 07/29/16 1338    Subjective Pt reports trip end of January and was sleeping in a motor home on this 75 month old trip.  They had to cut the trip short after 8 weeks due to back pain.  She had some improvement in back pain, leg pain (was radiating to hips).  Denies radicular and sensory sx, red flags.  She cont to have pain which interfered with sleeping, sitting, standing, walking.  She has had to limit her activities due to pain, including cooking a big meal.   Pertinent History hip bursitis   Limitations Sitting;Lifting;Walking;Standing;House hold activities;Other (comment)  stairs (going UP)   How long can you sit comfortably? <10 min   How long can you stand comfortably? <10 min   How long can you walk comfortably? after 20 min has pain and steadily increases with more time    Diagnostic tests XR done last week showed concave L scoliosis and diffuse significant arthritis   Patient Stated Goals Pt would like to sleep better, more regular exercise routine (used to walk 4 miles x 4 times per week)  current 2-3 miles, 2-3 per week more slowly.    Currently in Pain? Yes   Pain Score 5    Pain Location Back   Pain Orientation Right   Pain Descriptors / Indicators Aching   Pain Type Chronic pain   Pain Onset More than a month ago   Pain Frequency Intermittent   Aggravating Factors  walking, standing    Pain Relieving Factors medicine, resting in recliner, pillows and positioning , heat once worked , sleep on Rt. side   Effect of Pain on Daily Activities limits all her activity and has to think about everything and how it will affect her  pain    Multiple Pain Sites No            [facility] PT Assessment - 07/29/16 1349      Assessment   Medical Diagnosis lumbar    Referring Provider Dr. Dayton Martes, Dr. Cleophas Dunker    Onset Date/Surgical Date 03/07/16   Next MD Visit Dr. Cleophas Dunker, 3 weeks    Prior Therapy No      Precautions   Precautions None     Restrictions   Weight Bearing Restrictions No     Balance Screen   Has the patient fallen in the past 6 months No   Has the patient had a decrease in activity level because of a fear of falling?  No   Is the patient reluctant to leave their home because of a fear of falling?  No     Home Tourist information centre manager residence   Living Arrangements Spouse/significant other     Prior Function   Level of Independence Independent   Vocation Retired   Leisure travel, exercise, walking, hiking     Cognition   Overall Cognitive Status Within Functional Limits for tasks assessed     Observation/Other Assessments   Focus on Therapeutic Outcomes (FOTO)  48%     Sensation   Light Touch Appears Intact     Coordination   Gross Motor Movements are Fluid and Coordinated Not tested     Single Leg Stance   Comments able to stand for 20- sec each leg but with some degree of difficulty especially on R LE.      Posture/Postural Control   Posture/Postural Control Postural limitations   Posture Comments mild sway back ,R hip higher  than L., visible scoliosis noted L concave     AROM   Overall AROM Comments WFL except for pain with Rt. sidebending and Lt rotation all at the L4 level  on Rt. side      Strength   Right Hip Flexion 3+/5   Right Hip ABduction 3/5   Left Hip Flexion 4/5   Left Hip ABduction 4/5   Right Knee Flexion 5/5   Right Knee Extension 5/5   Left Knee Flexion 5/5   Left Knee Extension 5/5     Flexibility   Hamstrings good , 70-80 deg     Palpation   Palpation comment TTP, pain at Rt. QL muscle and thorughout Rt. superior lateral hip, glutes      Special Tests   Lumbar Tests Straight Leg Raise     Straight Leg Raise   Findings Negative     Ambulation/Gait   Ambulation Distance (Feet) 150 Feet   Assistive device None   Gait Pattern Antalgic;Lateral trunk lean to right   Ambulation Surface Level;Indoor            Objective measurements completed on examination: See above findings.          [facility] Adult PT Treatment/Exercise - 07/29/16 1349      Self-Care   Self-Care Posture;Heat/Ice Application;Other Self-Care Comments   Posture lordosis, scoliosis    Heat/Ice Application ice for bursitis and flare ups    Other Self-Care Comments  walking, HEP      Lumbar Exercises: Stretches   Single Knee to Chest Stretch 3 reps;30 seconds   Pelvic Tilt 10 seconds   Pelvic Tilt Limitations x 10      Cryotherapy   Number Minutes Cryotherapy 10 Minutes   Cryotherapy Location Lumbar Spine;Hip   Type of Cryotherapy Ice pack                PT Education - 07/29/16 1843    Education provided Yes   Education Details PT/POC, HEp, flexion and ext bias , anatomy , posture    Person(s) Educated Patient   Methods Explanation   Comprehension Verbalized understanding;Returned demonstration          PT Short Term Goals - 07/29/16 1953      PT SHORT TERM GOAL #1   Title Pt will be I with basic HEP for flexibilty and strength    Time 4   Period Weeks   Status New     PT  SHORT TERM GOAL #2  Title Pt will be able to sit for 30 min in typical chair more comfortably, min increase in back pain for meals, rest.    Time 4   Period Weeks   Status New     PT SHORT TERM GOAL #3   Title Pt will continue walk up to 2 miles but with 25% less pain, difficulty.    Time 4   Period Weeks   Status New     PT SHORT TERM GOAL #4   Title Pt will be able to get up and down stairs (<6 stairs) with only min difficulty in Rt. LE   Time 4   Period Weeks   Status New           PT Long Term Goals - 07/29/16 1956      PT LONG TERM GOAL #1   Title Pt will be I with more advanced HEP for posture LE strength and core    Time 8   Period Weeks   Status New     PT LONG TERM GOAL #2   Title Pt will be able to walk as needed in the community without increase in pain from baseline for up to 30 min    Time 8   Period Weeks   Status New     PT LONG TERM GOAL #3   Title Pt will be able to sleep with only min occasional sleep disturbance due to back pain.    Time 8   Period Weeks   Status New     PT LONG TERM GOAL #4   Title Pt will demo Rt. LE strength (hip abd, extension) to 4/5 or more for gait stability.    Time 8   Period Weeks   Status New     PT LONG TERM GOAL #5   Title FOTO score will improve to less than 35% impaired to dmeo functional improvement.    Time 8   Period Weeks   Status New                Plan - 07/29/16 1945    Clinical Impression Statement Pt presents with chronic R.t sided low back pain due to DDD, scoliosis which was aggravated earlier in the yr.  It has since improved a bit, has had less pain with prednisone.  She continues to be as active as she can be, walking slowly and with a limp that worsens with distance/time.  She had relief of pain with flexion based stretching today and ice.  She should do well with corrective exercise and other PT intervention, including dry needling should she agree.    Clinical Presentation Stable    Clinical Decision Making Low   Rehab Potential Excellent   PT Frequency 2x / week   PT Duration 8 weeks   PT Treatment/Interventions ADLs/Self Care Home Management;Dry needling;Taping;Cryotherapy;Electrical Stimulation;Moist Heat;Traction;Ultrasound;Passive range of motion;Neuromuscular re-education;Balance training;Therapeutic exercise;Therapeutic activities;Manual techniques;Functional mobility training;Patient/family education   PT Next Visit Plan check HEP and progress as tolerated, core and hip strength.  Rt QL manual and consider dry needling.    PT Home Exercise Plan PPT, knee to chest    Consulted and Agree with Plan of Care Patient      Patient will benefit from skilled therapeutic intervention in order to improve the following deficits and impairments:  Obesity, Abnormal gait, Pain, Increased fascial restricitons, Decreased strength, Decreased balance, Decreased range of motion, Impaired flexibility, Postural dysfunction  Visit Diagnosis: Chronic right-sided low back pain  with sciatica, sciatica laterality unspecified  Muscle weakness (generalized)  Difficulty in walking, not elsewhere classified      G-Codes - 07/30/16 2000    Functional Assessment Tool Used (Outpatient Only) FOTO   Functional Limitation Mobility: Walking and moving around   Mobility: Walking and Moving Around Current Status [phone]) At least 40 percent but less than 60 percent impaired, limited or restricted   Mobility: Walking and Moving Around Goal Status [phone]) At least 20 percent but less than 40 percent impaired, limited or restricted       Problem List Patient Active Problem List   Diagnosis Date Noted  . Back pain 07/15/2016  . Decreased hearing 07/15/2016  . Myofascial pain syndrome 10/08/2015  . Arthritis 06/20/2015  . HLD (hyperlipidemia) 06/20/2015  . Herpes zoster 04/13/2013  . Metatarsalgia 09/27/2012  . Allergic rhinitis 06/16/2011    Curby Carswell 2016/07/30, 8:02 PM  [facility] [address] [place], [place], [address] Phone: [phone]   Fax:  [phone]  Name: Denise Parker MRN: [id number] Date of Birth: [birth year]   Karie Mainland, PT Jul 30, 2016 8:02 PM Phone: [phone] Fax: [phone]

## 2016-07-29 NOTE — Patient Instructions (Signed)
PELVIC STABILIZATION: Pelvic Tilt (Lying)    Exhaling, pull belly toward spine, tilting pelvis forward. Inhaling, release. Repeat _10__ times. Do __2_ times per day.  Copyright  VHI. All rights reserved.    Knee to Chest    Lying supine, bend involved knee to chest __3_ times. Repeat with other leg. Hold 30 sec Do _3__ times per day.  Copyright  VHI. All rights reserved.

## 2016-07-30 ENCOUNTER — Other Ambulatory Visit: Payer: Self-pay | Admitting: Family Medicine

## 2016-07-30 DIAGNOSIS — R0982 Postnasal drip: Secondary | ICD-10-CM | POA: Diagnosis not present

## 2016-07-30 DIAGNOSIS — H6643 Suppurative otitis media, unspecified, bilateral: Secondary | ICD-10-CM | POA: Diagnosis not present

## 2016-08-07 ENCOUNTER — Encounter: Payer: Self-pay | Admitting: Physical Therapy

## 2016-08-07 ENCOUNTER — Ambulatory Visit: Payer: Medicare Other | Admitting: Physical Therapy

## 2016-08-07 DIAGNOSIS — M5440 Lumbago with sciatica, unspecified side: Principal | ICD-10-CM

## 2016-08-07 DIAGNOSIS — G8929 Other chronic pain: Secondary | ICD-10-CM

## 2016-08-07 DIAGNOSIS — M6281 Muscle weakness (generalized): Secondary | ICD-10-CM

## 2016-08-07 DIAGNOSIS — R262 Difficulty in walking, not elsewhere classified: Secondary | ICD-10-CM | POA: Diagnosis not present

## 2016-08-07 NOTE — Patient Instructions (Addendum)
   PELVIC TILT  Lie on back, legs bent. Exhale, tilting top of pelvis back, pubic bone up, to flatten lower back. Inhale, rolling pelvis opposite way, top forward, pubic bone down, arch in back. Repeat __10__ times. Do __2__ sessions per day. Copyright  VHI. All rights reserved.    Isometric Hold With Pelvic Floor (Hook-Lying)  Lie with hips and knees bent. Slowly inhale, and then exhale. Pull navel toward spine and tighten pelvic floor. Hold for __10_ seconds. Continue to breathe in and out during hold. Rest for _10__ seconds. Repeat __10_ times. Do __2-3_ times a day.   Knee Fold  Lie on back, legs bent, arms by sides. Exhale, lifting knee to chest. Inhale, returning. Keep abdominals flat, navel to spine. Repeat __10__ times, alternating legs. Do __2__ sessions per day.  Knee Drop  Keep pelvis stable. Without rotating hips, slowly drop knee to side, pause, return to center, bring knee across midline toward opposite hip. Feel obliques engaging. Repeat for ___10_ times each leg.   Copyright  VHI. All rights reserved.       Heel Slide to Straight   Slide one leg down to straight. Return. Be sure pelvis does not rock forward, tilt, rotate, or tip to side. Do _10__ times. Restabilize pelvis. Repeat with other leg. Do __1-2_ sets, __2_ times per day.  http://ss.exer.us/16   Copyright  VHI. All rights reserved.    Hamstring and ITB with strap stretching 2-3 x 30 sec x 2 each day

## 2016-08-07 NOTE — Therapy (Signed)
[facility] [address] [place], [place], [address] Phone: [phone]   Fax:  [phone]  Physical Therapy Treatment  Patient Details  Name: Denise Parker MRN: [id number] Date of Birth: 21-May-1950 Referring Provider: [name], Dr. Cleophas DunkerBassett   Encounter Date: 08/07/2016      PT End of Session - 08/07/16 1354    Visit Number 2   Number of Visits 16   Date for PT Re-Evaluation 09/23/16   PT Start Time 1332   PT Stop Time 1430   PT Time Calculation (min) 58 min   Activity Tolerance Patient tolerated treatment well   Behavior [facility] for tasks assessed/performed      Past Medical History:  Diagnosis Date  . HLD (hyperlipidemia)     Past Surgical History:  Procedure Laterality Date  . BREAST CYST ASPIRATION    . TONSILLECTOMY      There were no vitals filed for this visit.      Subjective Assessment - 08/07/16 1336    Subjective Once I came off Prednisone it came back with a vengeance.  Had a sinus infection and had to go to Urgent care.  Today I feel better than I have in awhile. Started take muscle relaxer and the ibuprofen and that helps.     Currently in Pain? Yes   Pain Score 1    Pain Location Back   Multiple Pain Sites Yes   Pain Score 4   Pain Location Hip   Pain Orientation Right;Left   Pain Descriptors / Indicators Sore   Pain Type Chronic pain   Pain Onset More than a month ago   Pain Frequency Intermittent             [facility] Adult PT Treatment/Exercise - 08/07/16 0001      Lumbar Exercises: Stretches   Active Hamstring Stretch 2 reps;30 seconds   Pelvic Tilt 10 seconds   Pelvic Tilt Limitations x 10    ITB Stretch 2 reps;30 seconds     Lumbar Exercises: Supine   Ab Set 10 reps   Clam 10 reps   Clam Limitations feet on small ball    Heel Slides 10 reps   Bent Knee Raise 10 reps   Bent Knee Raise Limitations and x 10 in reverse (march)    Bridge 10 reps   Bridge  Limitations ball squeeze      Lumbar Exercises: Sidelying   Clam --     Modalities   Modalities Moist Heat     Moist Heat Therapy   Number Minutes Moist Heat 10 Minutes   Moist Heat Location Lumbar Spine;Hip     Manual Therapy   Manual Therapy Soft tissue mobilization   Soft tissue mobilization Rt. QL and superior gluteals, parapsinals on Rt. side.  QL tight and painful, done in sidelying with pillow under L waist                 PT Education - 08/07/16 1353    Education provided Yes   Education Details pilates for stabilization    Person(s) Educated Patient   Methods Explanation;Demonstration   Comprehension Verbalized understanding;Returned demonstration;Need further instruction          PT Short Term Goals - 08/07/16 1358      PT SHORT TERM GOAL #1   Title Pt will be I with basic HEP for flexibilty and strength    Status On-going     PT SHORT TERM GOAL #  2   Title Pt will be able to sit for 30 min in typical chair more comfortably, min increase in back pain for meals, rest.    Status On-going     PT SHORT TERM GOAL #3   Title Pt will continue walk up to 2 miles but with 25% less pain, difficulty.    Status On-going     PT SHORT TERM GOAL #4   Title Pt will be able to get up and down stairs (<6 stairs) with only min difficulty in Rt. LE   Status On-going           PT Long Term Goals - 07/29/16 1956      PT LONG TERM GOAL #1   Title Pt will be I with more advanced HEP for posture LE strength and core    Time 8   Period Weeks   Status New     PT LONG TERM GOAL #2   Title Pt will be able to walk as needed in the community without increase in pain from baseline for up to 30 min    Time 8   Period Weeks   Status New     PT LONG TERM GOAL #3   Title Pt will be able to sleep with only min occasional sleep disturbance due to back pain.    Time 8   Period Weeks   Status New     PT LONG TERM GOAL #4   Title Pt will demo Rt. LE strength (hip abd,  extension) to 4/5 or more for gait stability.    Time 8   Period Weeks   Status New     PT LONG TERM GOAL #5   Title FOTO score will improve to less than 35% impaired to dmeo functional improvement.    Time 8   Period Weeks   Status New               Plan - 08/07/16 1424    Clinical Impression Statement Able to introduce stabilization exercises for lumbar stabilization and hip strength without increased pain. Pain increased after prednisone, has begun to alternate Ibuprofen and muscle relaxers which has helped the back morethan the hips.  Pt stopped knee to chest because that increased radicular sx.  Rt. QL tight and very painful.  No goals, 2nd visit.    PT Next Visit Plan check HEP and progress as tolerated, core and hip strength.  Rt QL manual and consider dry needling.    PT Home Exercise Plan prepilates and hamstring stretch    Consulted and Agree with Plan of Care Patient      Patient will benefit from skilled therapeutic intervention in order to improve the following deficits and impairments:  Obesity, Abnormal gait, Pain, Increased fascial restricitons, Decreased strength, Decreased balance, Decreased range of motion, Impaired flexibility, Postural dysfunction  Visit Diagnosis: Chronic right-sided low back pain with sciatica, sciatica laterality unspecified  Muscle weakness (generalized)  Difficulty in walking, not elsewhere classified     Problem List Patient Active Problem List   Diagnosis Date Noted  . Back pain 07/15/2016  . Decreased hearing 07/15/2016  . Myofascial pain syndrome 10/08/2015  . Arthritis 06/20/2015  . HLD (hyperlipidemia) 06/20/2015  . Herpes zoster 04/13/2013  . Metatarsalgia 09/27/2012  . Allergic rhinitis 06/16/2011    PAA,JENNIFER 08/07/2016, 2:28 PM  [facility] [address] [place], [place], [address] Phone: [phone]   Fax:  [phone]  Name: Denise  Kristee Parker MRN:  [id number] Date of Birth: [birth year]  Karie Mainland, PT 08/07/16 2:28 PM Phone: [phone] Fax: [phone]

## 2016-08-12 ENCOUNTER — Ambulatory Visit: Payer: Medicare Other | Admitting: Physical Therapy

## 2016-08-12 DIAGNOSIS — M5440 Lumbago with sciatica, unspecified side: Secondary | ICD-10-CM | POA: Diagnosis not present

## 2016-08-12 DIAGNOSIS — G8929 Other chronic pain: Secondary | ICD-10-CM

## 2016-08-12 DIAGNOSIS — M6281 Muscle weakness (generalized): Secondary | ICD-10-CM

## 2016-08-12 DIAGNOSIS — R262 Difficulty in walking, not elsewhere classified: Secondary | ICD-10-CM

## 2016-08-12 NOTE — Patient Instructions (Signed)
3 way hip stretch    Outer Hip Stretch: Reclined IT Band Stretch (Strap)    Strap around opposite foot, pull across only as far as possible with shoulders on mat. Hold for ___3 , 30 sec _ breaths. Repeat _2-3___ times each leg.  Copyright  VHI. All rights reserved.  Outer Hip Stretch: Figure Four (Chair)    Adjust bend of supporting leg for less deep stretch. Hold for ___3, 30 sec _ breaths. Repeat __2-3__ times each leg.  Copyright  VHI. All rights reserved.

## 2016-08-12 NOTE — Therapy (Signed)
[place], [place], [address] Phone: [phone]   Fax:  [phone]  Physical Therapy Treatment  Patient Details  Name: Denise Parker MRN: [id number] Date of Birth: Jul 18, 1950 Referring Provider: Dr. Deborra Medina, Dr. Layne Benton   Encounter Date: 08/12/2016      PT End of Session - 08/12/16 1738    Visit Number 3   Number of Visits 16   Date for PT Re-Evaluation 09/23/16   PT Start Time 6599   PT Stop Time 1509   PT Time Calculation (min) 54 min   Activity Tolerance Patient tolerated treatment well   Behavior During Therapy [facility] for tasks assessed/performed      Past Medical History:  Diagnosis Date  . HLD (hyperlipidemia)     Past Surgical History:  Procedure Laterality Date  . BREAST CYST ASPIRATION    . TONSILLECTOMY      There were no vitals filed for this visit.      Subjective Assessment - 08/12/16 1421    Subjective Walked this AM 2.4 miles, 50 min.  Pain did not worsen.  had some "pep" in my step.  The back is better and hips are still hurting.     Currently in Pain? Yes   Pain Score 2    Pain Location Back   Pain Orientation Right   Pain Descriptors / Indicators Aching   Pain Type Chronic pain   Pain Onset More than a month ago   Pain Frequency Intermittent   Pain Score 3   Pain Location Hip   Pain Orientation Right;Left   Pain Descriptors / Indicators Sore   Pain Type Chronic pain   Pain Onset More than a month ago   Pain Frequency Intermittent               [facility] Adult PT Treatment/Exercise - 08/12/16 0001      Lumbar Exercises: Stretches   ITB Stretch 2 reps;30 seconds   Piriformis Stretch 2 reps;30 seconds     Lumbar Exercises: Supine   Clam 10 reps   Clam Limitations unilat and bilat. also done with blue band for resistance      Knee/Hip Exercises: Stretches   Active Hamstring Stretch Both;1 rep;60 seconds   Other Knee/Hip Stretches 3 way hip stretch x 60  sec each leg      Knee/Hip Exercises: Aerobic   Nustep Nu Step L7 UE and LE   5 min      Knee/Hip Exercises: Standing   Hip Abduction Stengthening;Both;1 set;10 reps   Lateral Step Up Both;1 set;20 reps;Hand Hold: 1;Step Height: 8"     Moist Heat Therapy   Moist Heat Location --     Cryotherapy   Number Minutes Cryotherapy 10 Minutes  prone   Cryotherapy Location Lumbar Spine;Hip   Type of Cryotherapy Ice pack     Manual Therapy   Soft tissue mobilization Rt. QL and superior gluteals                  PT Short Term Goals - 08/12/16 1737      PT SHORT TERM GOAL #1   Title Pt will be I with basic HEP for flexibilty and strength    Status Achieved     PT SHORT TERM GOAL #2   Title Pt will be able to sit for 30 min in typical chair more comfortably, min increase in back pain for meals, rest.    Status On-going  PT SHORT TERM GOAL #3   Title Pt will continue walk up to 2 miles but with 25% less pain, difficulty.    Baseline met today?    Status Partially Met     PT SHORT TERM GOAL #4   Title Pt will be able to get up and down stairs (<6 stairs) with only min difficulty in Rt. LE   Baseline Lt leg felt weaker with this today.   Status On-going           PT Long Term Goals - 07/29/16 1956      PT LONG TERM GOAL #1   Title Pt will be I with more advanced HEP for posture LE strength and core    Time 8   Period Weeks   Status New     PT LONG TERM GOAL #2   Title Pt will be able to walk as needed in the community without increase in pain from baseline for up to 30 min    Time 8   Period Weeks   Status New     PT LONG TERM GOAL #3   Title Pt will be able to sleep with only min occasional sleep disturbance due to back pain.    Time 8   Period Weeks   Status New     PT LONG TERM GOAL #4   Title Pt will demo Rt. LE strength (hip abd, extension) to 4/5 or more for gait stability.    Time 8   Period Weeks   Status New     PT LONG TERM GOAL #5    Title FOTO score will improve to less than 35% impaired to dmeo functional improvement.    Time 8   Period Weeks   Status New               Plan - 08/12/16 1436    Clinical Impression Statement Pt is improving overall, able to walk without increasing pain.  Cont to be very sore and sensitive in bilateral lateral hips.  Her Lt LE weaker functionally but more L hip more flexible.  She sees Dr. Layne Benton tomorrow.  She would benefit from iontophoresis for bursitits.  Goals in progress, gave min correction for HEP.    PT Next Visit Plan check HEP and progress as tolerated, core and hip strength.  Rt QL manual and consider dry needling.    PT Home Exercise Plan prepilates and hamstring stretch , ITB and adductor, PPT, knee to chest    Consulted and Agree with Plan of Care Patient      Patient will benefit from skilled therapeutic intervention in order to improve the following deficits and impairments:  Obesity, Abnormal gait, Pain, Increased fascial restricitons, Decreased strength, Decreased balance, Decreased range of motion, Impaired flexibility, Postural dysfunction  Visit Diagnosis: Chronic right-sided low back pain with sciatica, sciatica laterality unspecified  Muscle weakness (generalized)  Difficulty in walking, not elsewhere classified     Problem List Patient Active Problem List   Diagnosis Date Noted  . Back pain 07/15/2016  . Decreased hearing 07/15/2016  . Myofascial pain syndrome 10/08/2015  . Arthritis 06/20/2015  . HLD (hyperlipidemia) 06/20/2015  . Herpes zoster 04/13/2013  . Metatarsalgia 09/27/2012  . Allergic rhinitis 06/16/2011    Denise Parker 08/12/2016, 5:40 PM  [facility] [address] [place], [place], [address] Phone: [phone]   Fax:  [phone]  Name: Denise Parker MRN: [id number] Date of Birth: [birth year]  Raeford Razor, PT 08/12/16 5:40 PM Phone: [phone] Fax:  [phone]

## 2016-08-13 DIAGNOSIS — M545 Low back pain: Secondary | ICD-10-CM | POA: Diagnosis not present

## 2016-08-13 DIAGNOSIS — M25551 Pain in right hip: Secondary | ICD-10-CM | POA: Diagnosis not present

## 2016-08-13 DIAGNOSIS — M25552 Pain in left hip: Secondary | ICD-10-CM | POA: Diagnosis not present

## 2016-08-14 ENCOUNTER — Ambulatory Visit: Payer: Medicare Other | Admitting: Physical Therapy

## 2016-08-14 DIAGNOSIS — G8929 Other chronic pain: Secondary | ICD-10-CM | POA: Diagnosis not present

## 2016-08-14 DIAGNOSIS — R262 Difficulty in walking, not elsewhere classified: Secondary | ICD-10-CM | POA: Diagnosis not present

## 2016-08-14 DIAGNOSIS — M6281 Muscle weakness (generalized): Secondary | ICD-10-CM | POA: Diagnosis not present

## 2016-08-14 DIAGNOSIS — M5440 Lumbago with sciatica, unspecified side: Secondary | ICD-10-CM | POA: Diagnosis not present

## 2016-08-14 NOTE — Therapy (Signed)
[place], [place], [address] Phone: [phone]   Fax:  [phone]  Physical Therapy Treatment  Patient Details  Name: Denise Parker MRN: [id number] Date of Birth: 1950/11/19 Referring Provider: Dr. Deborra Medina, Dr. Layne Benton   Encounter Date: 08/14/2016      PT End of Session - 08/14/16 0946    Visit Number 4   Number of Visits 16   Date for PT Re-Evaluation 09/23/16   PT Start Time 0931   PT Stop Time 1020   PT Time Calculation (min) 49 min   Activity Tolerance Patient tolerated treatment well   Behavior During Therapy [facility] for tasks assessed/performed      Past Medical History:  Diagnosis Date  . HLD (hyperlipidemia)     Past Surgical History:  Procedure Laterality Date  . BREAST CYST ASPIRATION    . TONSILLECTOMY      There were no vitals filed for this visit.      Subjective Assessment - 08/14/16 0937    Subjective Back pain increased with Nustep today 2/10.  Hips 2/10. Dr. Layne Benton signed OK for ionto and gave her a new referral for DDD and hip bursitis.               [facility] Adult PT Treatment/Exercise - 08/14/16 0001      Lumbar Exercises: Stretches   Single Knee to Chest Stretch 2 reps;30 seconds   Lower Trunk Rotation 10 seconds   Lower Trunk Rotation Limitations 3   Pelvic Tilt 10 seconds   Pelvic Tilt Limitations x 10      Lumbar Exercises: Supine   Ab Set 10 reps   Bridge 10 reps   Bridge Limitations ball squeeze cues for technique    Straight Leg Raise 10 reps   Large Ball Abdominal Isometric 10 reps   Large Ball Abdominal Isometric Limitations UE, LE altenating with forced exhale    Large Ball Oblique Isometric 5 reps   Large Ball Oblique Isometric Limitations opp arm, leg    Other Supine Lumbar Exercises dead bug x 10 each      Lumbar Exercises: Sidelying   Clam 10 reps   Hip Abduction 10 reps     Knee/Hip Exercises: Stretches   Piriformis Stretch Both;2  reps;30 seconds   Piriformis Stretch Limitations done knee to opp. shoulder and figure 4    Other Knee/Hip Stretches 3 way hip stretch x 60 sec each leg      Knee/Hip Exercises: Aerobic   Nustep increased pain after 4 min      Iontophoresis   Type of Iontophoresis Dexamethasone   Location bilateral hips    Dose 1 cc    Time 6 hour                   PT Short Term Goals - 08/12/16 1737      PT SHORT TERM GOAL #1   Title Pt will be I with basic HEP for flexibilty and strength    Status Achieved     PT SHORT TERM GOAL #2   Title Pt will be able to sit for 30 min in typical chair more comfortably, min increase in back pain for meals, rest.    Status On-going     PT SHORT TERM GOAL #3   Title Pt will continue walk up to 2 miles but with 25% less pain, difficulty.    Baseline met today?    Status Partially Met  PT SHORT TERM GOAL #4   Title Pt will be able to get up and down stairs (<6 stairs) with only min difficulty in Rt. LE   Baseline Lt leg felt weaker with this today.   Status On-going           PT Long Term Goals - 07/29/16 1956      PT LONG TERM GOAL #1   Title Pt will be I with more advanced HEP for posture LE strength and core    Time 8   Period Weeks   Status New     PT LONG TERM GOAL #2   Title Pt will be able to walk as needed in the community without increase in pain from baseline for up to 30 min    Time 8   Period Weeks   Status New     PT LONG TERM GOAL #3   Title Pt will be able to sleep with only min occasional sleep disturbance due to back pain.    Time 8   Period Weeks   Status New     PT LONG TERM GOAL #4   Title Pt will demo Rt. LE strength (hip abd, extension) to 4/5 or more for gait stability.    Time 8   Period Weeks   Status New     PT LONG TERM GOAL #5   Title FOTO score will improve to less than 35% impaired to dmeo functional improvement.    Time 8   Period Weeks   Status New               Plan -  08/14/16 1006    Clinical Impression Statement Pt needs cues for technique with exercises, able to isolate glute med in sidelying with difficulty.  Trial of ionto today.  Will finish PT and possibly get MRI if she continues to have pain.  Overall, she is improving and having less pain.     PT Next Visit Plan check HEP and progress as tolerated, core and hip strength.  Rt QL manual and consider dry needling.    PT Home Exercise Plan prepilates and hamstring stretch , ITB and adductor, PPT, knee to chest    Consulted and Agree with Plan of Care Patient      Patient will benefit from skilled therapeutic intervention in order to improve the following deficits and impairments:  Obesity, Abnormal gait, Pain, Increased fascial restricitons, Decreased strength, Decreased balance, Decreased range of motion, Impaired flexibility, Postural dysfunction  Visit Diagnosis: No diagnosis found.     Problem List Patient Active Problem List   Diagnosis Date Noted  . Back pain 07/15/2016  . Decreased hearing 07/15/2016  . Myofascial pain syndrome 10/08/2015  . Arthritis 06/20/2015  . HLD (hyperlipidemia) 06/20/2015  . Herpes zoster 04/13/2013  . Metatarsalgia 09/27/2012  . Allergic rhinitis 06/16/2011    Vern Prestia 08/14/2016, 4:15 PM  [facility] [address] [place], [place], [address] Phone: [phone]   Fax:  [phone]  Name: Beata Beason MRN: [id number] Date of Birth: 1950/11/02  Raeford Razor, PT 08/14/16 4:15 PM Phone: [phone] Fax: [phone]

## 2016-08-25 ENCOUNTER — Ambulatory Visit: Payer: Medicare Other | Attending: Family Medicine | Admitting: Physical Therapy

## 2016-08-25 ENCOUNTER — Encounter: Payer: Self-pay | Admitting: Physical Therapy

## 2016-08-25 DIAGNOSIS — M5440 Lumbago with sciatica, unspecified side: Secondary | ICD-10-CM | POA: Diagnosis not present

## 2016-08-25 DIAGNOSIS — M6281 Muscle weakness (generalized): Secondary | ICD-10-CM | POA: Insufficient documentation

## 2016-08-25 DIAGNOSIS — G8929 Other chronic pain: Secondary | ICD-10-CM | POA: Diagnosis not present

## 2016-08-25 DIAGNOSIS — R262 Difficulty in walking, not elsewhere classified: Secondary | ICD-10-CM

## 2016-08-25 NOTE — Therapy (Signed)
[facility] [address] [place], [place], [address] Phone: [phone]   Fax:  [phone]  Physical Therapy Treatment  Patient Details  Name: Denise Parker MRN: [id number] Date of Birth: [birth year] Referring Provider: Dr. Dayton Martes, Dr. Cleophas Dunker   Encounter Date: 08/25/2016      PT End of Session - 08/25/16 0752    Visit Number 5   Number of Visits 16   Date for PT Re-Evaluation 09/23/16   PT Start Time 0800   PT Stop Time 0843   PT Time Calculation (min) 43 min   Activity Tolerance Patient tolerated treatment well   Behavior During Therapy [facility] for tasks assessed/performed      Past Medical History:  Diagnosis Date  . HLD (hyperlipidemia)     Past Surgical History:  Procedure Laterality Date  . BREAST CYST ASPIRATION    . TONSILLECTOMY      There were no vitals filed for this visit.      Subjective Assessment - 08/25/16 0801    Subjective R sided LBP and stiffness this morning. Some pain in bilateral hip joints (pointing to G trochanter bilat) after ionto, L side was better after a day; R side improved after 3-4 days.    Patient Stated Goals Pt would like to sleep better, more regular exercise routine (used to walk 4 miles x 4 times per week) current 2-3 miles, 2-3 per week more slowly.    Currently in Pain? Yes   Pain Score 3    Pain Location Back   Pain Orientation Right   Pain Descriptors / Indicators Sore  stiff   Pain Score 2   Pain Location Hip   Pain Orientation Left;Right   Pain Descriptors / Indicators Sore                         [facility] Adult PT Treatment/Exercise - 08/25/16 0001      Lumbar Exercises: Stretches   Single Knee to Chest Stretch 2 reps;30 seconds   ITB Stretch 2 reps;30 seconds     Knee/Hip Exercises: Stretches   Passive Hamstring Stretch Limitations supine green strap 30s each   Other Knee/Hip Stretches figure 4     Knee/Hip Exercises: Aerobic   Nustep 5  min L4     Manual Therapy   Soft tissue mobilization biltateral glut max & med, IASTM          Trigger Point Dry Needling - 08/25/16 0840    Muscles Treated Lower Body Gluteus minimus;Gluteus maximus   Gluteus Maximus Response Twitch response elicited;Palpable increased muscle length   Gluteus Minimus Response Twitch response elicited;Palpable increased muscle length  Medius              PT Education - 08/25/16 0806    Education provided Yes   Education Details exercise form/rationale, TPDN & expected outcomes, sleeping posture   Person(s) Educated Patient   Methods Explanation;Demonstration;Tactile cues;Verbal cues   Comprehension Verbalized understanding;Returned demonstration;Verbal cues required;Tactile cues required;Need further instruction          PT Short Term Goals - 08/12/16 1737      PT SHORT TERM GOAL #1   Title Pt will be I with basic HEP for flexibilty and strength    Status Achieved     PT SHORT TERM GOAL #2   Title Pt will be able to sit for 30 min in typical chair more comfortably, min increase in back pain for meals,  rest.    Status On-going     PT SHORT TERM GOAL #3   Title Pt will continue walk up to 2 miles but with 25% less pain, difficulty.    Baseline met today?    Status Partially Met     PT SHORT TERM GOAL #4   Title Pt will be able to get up and down stairs (<6 stairs) with only min difficulty in Rt. LE   Baseline Lt leg felt weaker with this today.   Status On-going           PT Long Term Goals - 07/29/16 1956      PT LONG TERM GOAL #1   Title Pt will be I with more advanced HEP for posture LE strength and core    Time 8   Period Weeks   Status New     PT LONG TERM GOAL #2   Title Pt will be able to walk as needed in the community without increase in pain from baseline for up to 30 min    Time 8   Period Weeks   Status New     PT LONG TERM GOAL #3   Title Pt will be able to sleep with only min occasional sleep  disturbance due to back pain.    Time 8   Period Weeks   Status New     PT LONG TERM GOAL #4   Title Pt will demo Rt. LE strength (hip abd, extension) to 4/5 or more for gait stability.    Time 8   Period Weeks   Status New     PT LONG TERM GOAL #5   Title FOTO score will improve to less than 35% impaired to dmeo functional improvement.    Time 8   Period Weeks   Status New               Plan - 08/25/16 0093    Clinical Impression Statement L side feels fabulous, R side still a little sore after treatment today. I asked pt to stretch a couple more times today and keep moving as well as drink water.    PT Treatment/Interventions ADLs/Self Care Home Management;Dry needling;Taping;Cryotherapy;Electrical Stimulation;Moist Heat;Traction;Ultrasound;Passive range of motion;Neuromuscular re-education;Balance training;Therapeutic exercise;Therapeutic activities;Manual techniques;Functional mobility training;Patient/family education   PT Next Visit Plan check HEP and progress as tolerated, core and hip strength.  Rt QL manual; how was DN soreness?   PT Home Exercise Plan prepilates and hamstring stretch , ITB and adductor, PPT, knee to chest    Consulted and Agree with Plan of Care Patient      Patient will benefit from skilled therapeutic intervention in order to improve the following deficits and impairments:  Obesity, Abnormal gait, Pain, Increased fascial restricitons, Decreased strength, Decreased balance, Decreased range of motion, Impaired flexibility, Postural dysfunction  Visit Diagnosis: Chronic right-sided low back pain with sciatica, sciatica laterality unspecified  Difficulty in walking, not elsewhere classified  Muscle weakness (generalized)     Problem List Patient Active Problem List   Diagnosis Date Noted  . Back pain 07/15/2016  . Decreased hearing 07/15/2016  . Myofascial pain syndrome 10/08/2015  . Arthritis 06/20/2015  . HLD (hyperlipidemia) 06/20/2015   . Herpes zoster 04/13/2013  . Metatarsalgia 09/27/2012  . Allergic rhinitis 06/16/2011   Annie Saephan C. Windell Musson PT, DPT 08/25/16 8:46 AM   [facility] [address] [place], [place], [address] Phone: [phone]   Fax:  [phone]  Name: Denise  Jermiya Parker MRN: [id number] Date of Birth: 07-10-1950

## 2016-08-28 ENCOUNTER — Encounter: Payer: Self-pay | Admitting: Physical Therapy

## 2016-08-28 ENCOUNTER — Ambulatory Visit: Payer: Medicare Other | Admitting: Physical Therapy

## 2016-08-28 DIAGNOSIS — M6281 Muscle weakness (generalized): Secondary | ICD-10-CM | POA: Diagnosis not present

## 2016-08-28 DIAGNOSIS — M5440 Lumbago with sciatica, unspecified side: Secondary | ICD-10-CM | POA: Diagnosis not present

## 2016-08-28 DIAGNOSIS — G8929 Other chronic pain: Secondary | ICD-10-CM | POA: Diagnosis not present

## 2016-08-28 DIAGNOSIS — R262 Difficulty in walking, not elsewhere classified: Secondary | ICD-10-CM

## 2016-08-28 NOTE — Therapy (Signed)
[place], [place], [address] Phone: [phone]   Fax:  [phone]  Physical Therapy Treatment  Patient Details  Name: Denise Parker MRN: [id number] Date of Birth: 21-Sep-1950 Referring Provider: Dr. Deborra Medina, Dr. Layne Benton   Encounter Date: 08/28/2016    Past Medical History:  Diagnosis Date  . HLD (hyperlipidemia)     Past Surgical History:  Procedure Laterality Date  . BREAST CYST ASPIRATION    . TONSILLECTOMY      There were no vitals filed for this visit.      Subjective Assessment - 08/28/16 1333    Currently in Pain? Yes   Pain Score 3   2-3/10   Pain Location Back   Pain Orientation Right   Pain Descriptors / Indicators Sore   Pain Type Chronic pain   Pain Onset More than a month ago   Pain Frequency Intermittent   Pain Score 0   Pain Location Hip   Pain Orientation Right;Left   Pain Frequency Intermittent                         [facility] Adult PT Treatment/Exercise - 08/28/16 1336      Lumbar Exercises: Stretches   Single Knee to Chest Stretch 2 reps;30 seconds   Lower Trunk Rotation 10 seconds   Lower Trunk Rotation Limitations 2   Pelvic Tilt 10 seconds   Pelvic Tilt Limitations x 10    ITB Stretch 2 reps;30 seconds   Piriformis Stretch 2 reps;30 seconds     Lumbar Exercises: Supine   Ab Set 5 reps   AB Set Limitations VC with cough and Breathing cues  10 sec hold   Clam 10 reps   Clam Limitations bil   Bridge 10 reps   Bridge Limitations ball squeeze cues for technique    Straight Leg Raise 10 reps   Large Ball Abdominal Isometric 10 reps   Large Ball Abdominal Isometric Limitations UE, LE altenating with forced exhale    Other Supine Lumbar Exercises dead bug x 10 each   back a little irritated  went back to UE,LE alt pain better     Moist Heat Therapy   Number Minutes Moist Heat 10 Minutes   Moist Heat Location Lumbar Spine     Cryotherapy    Number Minutes Cryotherapy 10 Minutes     Manual Therapy   Soft tissue mobilization biltateral glut max & med, IASTM          Trigger Point Dry Needling - 08/28/16 1347    Consent Given? Yes   Education Handout Provided --  education previously given   Muscles Treated Lower Body Gluteus maximus;Gluteus minimus  bil   Gluteus Maximus Response Palpable increased muscle length;Twitch response elicited   Gluteus Minimus Response Twitch response elicited  glut med                PT Short Term Goals - 08/12/16 1737      PT SHORT TERM GOAL #1   Title Pt will be I with basic HEP for flexibilty and strength    Status Achieved     PT SHORT TERM GOAL #2   Title Pt will be able to sit for 30 min in typical chair more comfortably, min increase in back pain for meals, rest.    Status On-going     PT SHORT TERM GOAL #3   Title Pt will continue walk up  to 2 miles but with 25% less pain, difficulty.    Baseline met today?    Status Partially Met     PT SHORT TERM GOAL #4   Title Pt will be able to get up and down stairs (<6 stairs) with only min difficulty in Rt. LE   Baseline Lt leg felt weaker with this today.   Status On-going           PT Long Term Goals - 07/29/16 1956      PT LONG TERM GOAL #1   Title Pt will be I with more advanced HEP for posture LE strength and core    Time 8   Period Weeks   Status New     PT LONG TERM GOAL #2   Title Pt will be able to walk as needed in the community without increase in pain from baseline for up to 30 min    Time 8   Period Weeks   Status New     PT LONG TERM GOAL #3   Title Pt will be able to sleep with only min occasional sleep disturbance due to back pain.    Time 8   Period Weeks   Status New     PT LONG TERM GOAL #4   Title Pt will demo Rt. LE strength (hip abd, extension) to 4/5 or more for gait stability.    Time 8   Period Weeks   Status New     PT LONG TERM GOAL #5   Title FOTO score will improve  to less than 35% impaired to dmeo functional improvement.    Time 8   Period Weeks   Status New               Plan - 08/28/16 1334    Clinical Impression Statement Pt enters clinic with no pain and performed canning chow chow for all day Monday.   Both hips with no pain but a 2-3/10 in back today. Pt was able to perform exercises well without pain.  Pt with dead bug needed VC and TC and reported slight twinge.  returned to UE/LE ab isometric and pain decreased.  Pt consented to TDN and was closely monitored througthout session. Pt responds well to  TDN and soft tissue post exercises   Rehab Potential Excellent   PT Frequency 2x / week   PT Duration 8 weeks   PT Treatment/Interventions ADLs/Self Care Home Management;Dry needling;Taping;Cryotherapy;Electrical Stimulation;Moist Heat;Traction;Ultrasound;Passive range of motion;Neuromuscular re-education;Balance training;Therapeutic exercise;Therapeutic activities;Manual techniques;Functional mobility training;Patient/family education   PT Next Visit Plan check HEP and progress as tolerated, core and hip strength.  Rt QL manual; quadriped exercises? next session   PT Home Exercise Plan prepilates and hamstring stretch , ITB and adductor, PPT, knee to chest    Consulted and Agree with Plan of Care Patient      Patient will benefit from skilled therapeutic intervention in order to improve the following deficits and impairments:  Obesity, Abnormal gait, Pain, Increased fascial restricitons, Decreased strength, Decreased balance, Decreased range of motion, Impaired flexibility, Postural dysfunction  Visit Diagnosis: Chronic right-sided low back pain with sciatica, sciatica laterality unspecified  Difficulty in walking, not elsewhere classified  Muscle weakness (generalized)     Problem List Patient Active Problem List   Diagnosis Date Noted  . Back pain 07/15/2016  . Decreased hearing 07/15/2016  . Myofascial pain syndrome  10/08/2015  . Arthritis 06/20/2015  . HLD (hyperlipidemia) 06/20/2015  . Herpes zoster 04/13/2013  .  Metatarsalgia 09/27/2012  . Allergic rhinitis 06/16/2011   Voncille Lo, PT Certified Exercise Expert for the Aging Adult  08/28/16 2:22 PM Phone: [phone] Fax: [facility] [address] [place], [place], [address] Phone: [phone]   Fax:  [phone]  Name: Denise Parker MRN: [id number] Date of Birth: 06-Apr-1950

## 2016-09-01 ENCOUNTER — Ambulatory Visit: Payer: Medicare Other | Admitting: Physical Therapy

## 2016-09-01 ENCOUNTER — Encounter: Payer: Self-pay | Admitting: Physical Therapy

## 2016-09-01 DIAGNOSIS — G8929 Other chronic pain: Secondary | ICD-10-CM

## 2016-09-01 DIAGNOSIS — M5440 Lumbago with sciatica, unspecified side: Principal | ICD-10-CM

## 2016-09-01 DIAGNOSIS — R262 Difficulty in walking, not elsewhere classified: Secondary | ICD-10-CM | POA: Diagnosis not present

## 2016-09-01 DIAGNOSIS — M6281 Muscle weakness (generalized): Secondary | ICD-10-CM

## 2016-09-01 NOTE — Therapy (Signed)
[facility] [place], [place], [address] Phone: [phone]   Fax:  [phone]  Physical Therapy Treatment  Patient Details  Name: Denise Parker MRN: [id number] Date of Birth: 1950/04/12 Referring Provider: Dr. Deborra Medina, Dr. Layne Benton   Encounter Date: 09/01/2016      PT End of Session - 09/01/16 1417    Visit Number 6   Number of Visits 16   Date for PT Re-Evaluation 09/23/16   PT Start Time 9449   PT Stop Time 1456   PT Time Calculation (min) 39 min   Activity Tolerance Patient tolerated treatment well   Behavior During Therapy [facility] for tasks assessed/performed      Past Medical History:  Diagnosis Date  . HLD (hyperlipidemia)     Past Surgical History:  Procedure Laterality Date  . BREAST CYST ASPIRATION    . TONSILLECTOMY      There were no vitals filed for this visit.      Subjective Assessment - 09/01/16 1417    Subjective L hip feels good, R is having some trouble. Sore after last visit that just didnt seem to go down. Had a moment over the weekend where her back did not hurt at all. R hip 3/10, back 2/10. Back is more of a discomfort.                          [place] Adult PT Treatment/Exercise - 09/01/16 0001      Exercises   Exercises Knee/Hip     Lumbar Exercises: Supine   AB Set Limitations supine pelvic tilt & seated   Other Supine Lumbar Exercises iso press ball into thigh x10 each     Lumbar Exercises: Sidelying   Clam 20 reps   Hip Abduction 20 reps   Hip Abduction Weights (lbs) bent knee raise-knee ext-knee flx-lower     Knee/Hip Exercises: Stretches   Passive Hamstring Stretch Limitations supine green strap 30s each  +adduction for ITBand stretch   Gastroc Stretch 2 reps;30 seconds   Gastroc Stretch Limitations slant board   Other Knee/Hip Stretches figure 4 pull across     Knee/Hip Exercises: Aerobic   Nustep 5 min L5     Manual Therapy   Soft tissue  mobilization roller R hip, ITB, hamstrings                  PT Short Term Goals - 08/12/16 1737      PT SHORT TERM GOAL #1   Title Pt will be I with basic HEP for flexibilty and strength    Status Achieved     PT SHORT TERM GOAL #2   Title Pt will be able to sit for 30 min in typical chair more comfortably, min increase in back pain for meals, rest.    Status On-going     PT SHORT TERM GOAL #3   Title Pt will continue walk up to 2 miles but with 25% less pain, difficulty.    Baseline met today?    Status Partially Met     PT SHORT TERM GOAL #4   Title Pt will be able to get up and down stairs (<6 stairs) with only min difficulty in Rt. LE   Baseline Lt leg felt weaker with this today.   Status On-going           PT Long Term Goals - 07/29/16 1956      PT  LONG TERM GOAL #1   Title Pt will be I with more advanced HEP for posture LE strength and core    Time 8   Period Weeks   Status New     PT LONG TERM GOAL #2   Title Pt will be able to walk as needed in the community without increase in pain from baseline for up to 30 min    Time 8   Period Weeks   Status New     PT LONG TERM GOAL #3   Title Pt will be able to sleep with only min occasional sleep disturbance due to back pain.    Time 8   Period Weeks   Status New     PT LONG TERM GOAL #4   Title Pt will demo Rt. LE strength (hip abd, extension) to 4/5 or more for gait stability.    Time 8   Period Weeks   Status New     PT LONG TERM GOAL #5   Title FOTO score will improve to less than 35% impaired to dmeo functional improvement.    Time 8   Period Weeks   Status New               Plan - 09/01/16 1457    Clinical Impression Statement Fatigue in R hip abductors, was unable to complete 20 reps. Reported hips "feel pretty good" following treatment. significant difficulty in activating abdominal wall today.    PT Treatment/Interventions ADLs/Self Care Home Management;Dry  needling;Taping;Cryotherapy;Electrical Stimulation;Moist Heat;Traction;Ultrasound;Passive range of motion;Neuromuscular re-education;Balance training;Therapeutic exercise;Therapeutic activities;Manual techniques;Functional mobility training;Patient/family education   PT Next Visit Plan hip abductor strength, core activation   PT Home Exercise Plan prepilates and hamstring stretch , ITB and adductor, PPT, knee to chest    Consulted and Agree with Plan of Care Patient      Patient will benefit from skilled therapeutic intervention in order to improve the following deficits and impairments:  Obesity, Abnormal gait, Pain, Increased fascial restricitons, Decreased strength, Decreased balance, Decreased range of motion, Impaired flexibility, Postural dysfunction  Visit Diagnosis: Chronic right-sided low back pain with sciatica, sciatica laterality unspecified  Difficulty in walking, not elsewhere classified  Muscle weakness (generalized)     Problem List Patient Active Problem List   Diagnosis Date Noted  . Back pain 07/15/2016  . Decreased hearing 07/15/2016  . Myofascial pain syndrome 10/08/2015  . Arthritis 06/20/2015  . HLD (hyperlipidemia) 06/20/2015  . Herpes zoster 04/13/2013  . Metatarsalgia 09/27/2012  . Allergic rhinitis 06/16/2011   Jannetta Massey C. Avin Gibbons PT, DPT 09/01/16 2:59 PM   [facility] [address] [place], [place], [address] Phone: [phone]   Fax:  [phone]  Name: Denise Parker MRN: [id number] Date of Birth: [birth year]

## 2016-09-02 ENCOUNTER — Ambulatory Visit
Admission: RE | Admit: 2016-09-02 | Discharge: 2016-09-02 | Disposition: A | Discharge status: Home / Self Care | Payer: Medicare Other | Source: Ambulatory Visit | Attending: Family Medicine | Admitting: Family Medicine

## 2016-09-02 DIAGNOSIS — E2839 Other primary ovarian failure: Secondary | ICD-10-CM

## 2016-09-02 DIAGNOSIS — M818 Other osteoporosis without current pathological fracture: Secondary | ICD-10-CM | POA: Diagnosis not present

## 2016-09-02 DIAGNOSIS — Z1231 Encounter for screening mammogram for malignant neoplasm of breast: Secondary | ICD-10-CM | POA: Diagnosis not present

## 2016-09-02 DIAGNOSIS — M810 Age-related osteoporosis without current pathological fracture: Secondary | ICD-10-CM | POA: Diagnosis not present

## 2016-09-04 ENCOUNTER — Ambulatory Visit: Payer: Medicare Other | Admitting: Physical Therapy

## 2016-09-04 ENCOUNTER — Encounter: Payer: Self-pay | Admitting: Physical Therapy

## 2016-09-04 DIAGNOSIS — R262 Difficulty in walking, not elsewhere classified: Secondary | ICD-10-CM | POA: Diagnosis not present

## 2016-09-04 DIAGNOSIS — G8929 Other chronic pain: Secondary | ICD-10-CM

## 2016-09-04 DIAGNOSIS — M5440 Lumbago with sciatica, unspecified side: Secondary | ICD-10-CM | POA: Diagnosis not present

## 2016-09-04 DIAGNOSIS — M6281 Muscle weakness (generalized): Secondary | ICD-10-CM

## 2016-09-04 NOTE — Therapy (Signed)
[address], [place], [address] Phone: [phone]   Fax:  [phone]  Physical Therapy Treatment  Patient Details  Name: Denise Parker MRN: [id number] Date of Birth: 06/10/1950 Referring Provider: Dr. Deborra Medina, Dr. Layne Benton   Encounter Date: 09/04/2016      PT End of Session - 09/04/16 1200    Visit Number 7   Number of Visits 16   Date for PT Re-Evaluation 09/23/16   PT Start Time 1275   PT Stop Time 1234   PT Time Calculation (min) 49 min   Activity Tolerance Patient tolerated treatment well   Behavior During Therapy [facility] for tasks assessed/performed      Past Medical History:  Diagnosis Date  . HLD (hyperlipidemia)     Past Surgical History:  Procedure Laterality Date  . BREAST CYST ASPIRATION    . TONSILLECTOMY      There were no vitals filed for this visit.      Subjective Assessment - 09/04/16 1159    Subjective Overall improving, walking about 2.5 miles 1-2 times per week.  No increase in back pain with walking during but does increase the next day. Back and L hip much better.  Rt. hip 4/10.     Currently in Pain? Yes   Pain Score 2    Pain Location Back   Pain Orientation Right;Lower   Pain Descriptors / Indicators Aching;Sore   Pain Type Chronic pain   Pain Onset More than a month ago   Pain Frequency Intermittent   Aggravating Factors  walking, standing too long    Pain Relieving Factors meds, stretching    Pain Score 1   Pain Location Hip   Pain Orientation Right   Pain Descriptors / Indicators Sore   Pain Type Chronic pain   Pain Onset More than a month ago   Pain Frequency Intermittent             [facility] Adult PT Treatment/Exercise - 09/04/16 0001      Self-Care   Other Self-Care Comments  Pilates mat class and principles, intro to Reformer      Knee/Hip Exercises: Stretches   Active Hamstring Stretch Both;2 reps;30 seconds   Piriformis Stretch Both;2  reps;30 seconds   Other Knee/Hip Stretches figure 4 pull across     Knee/Hip Exercises: Aerobic   Nustep 7 min L5, LE strengthening      Knee/Hip Exercises: Machines for Strengthening   Other Machine Pilates Reformer see note      Pilates Reformer used for LE/core strength, postural strength, lumbopelvic disassociation and core control.  Exercises included:  Footwork 2 Red 1 blue parallel, heels and forefoot.  Single leg work as well, needs rare cues to keep pelvis level  Bridging 4 springs used ball for alignment, able to get from a mini bridge to near full with time and proper abdominal support, breathing.   Supine Arm work 1 red Arm arcs x 15 reps       PT Education - 09/04/16 1220    Education provided Yes   Education Details Pilates Reformer    Person(s) Educated Patient   Methods Explanation;Demonstration   Comprehension Verbalized understanding;Returned demonstration          PT Short Term Goals - 09/04/16 1238      PT SHORT TERM GOAL #1   Title Pt will be I with basic HEP for flexibilty and strength    Status Achieved  PT SHORT TERM GOAL #2   Title Pt will be able to sit for 30 min in typical chair more comfortably, min increase in back pain for meals, rest.    Baseline >30 min back pain increases    Status Partially Met     PT SHORT TERM GOAL #3   Title Pt will continue walk up to 2 miles but with 25% less pain, difficulty.    Status Partially Met     PT SHORT TERM GOAL #4   Title Pt will be able to get up and down stairs (<6 stairs) with only min difficulty in Rt. LE   Status Unable to assess           PT Long Term Goals - 09/04/16 1239      PT LONG TERM GOAL #1   Title Pt will be I with more advanced HEP for posture LE strength and core    Status On-going     PT LONG TERM GOAL #2   Title Pt will be able to walk as needed in the community without increase in pain from baseline for up to 30 min    Status Unable to assess     PT LONG TERM  GOAL #3   Title Pt will be able to sleep with only min occasional sleep disturbance due to back pain.    Status Achieved     PT LONG TERM GOAL #4   Title Pt will demo Rt. LE strength (hip abd, extension) to 4/5 or more for gait stability.    Status Unable to assess     PT LONG TERM GOAL #5   Title FOTO score will improve to less than 35% impaired to dmeo functional improvement.    Status Unable to assess               Plan - 09/04/16 1230    Clinical Impression Statement Was able to introduce the PIlates Reformer into treatment for core and hip strengthening, proprioception. No pain with supine arm work, slight strain.  She is pleased with the progress she is making, no pain interfering with sleep.    PT Next Visit Plan FOTO and GCODE, goals , Pilates, hip abductor strength, core activation   PT Home Exercise Plan prepilates and hamstring stretch , ITB and adductor, PPT, knee to chest    Consulted and Agree with Plan of Care Patient      Patient will benefit from skilled therapeutic intervention in order to improve the following deficits and impairments:  Obesity, Abnormal gait, Pain, Increased fascial restricitons, Decreased strength, Decreased balance, Decreased range of motion, Impaired flexibility, Postural dysfunction  Visit Diagnosis: Chronic right-sided low back pain with sciatica, sciatica laterality unspecified  Difficulty in walking, not elsewhere classified  Muscle weakness (generalized)     Problem List Patient Active Problem List   Diagnosis Date Noted  . Back pain 07/15/2016  . Decreased hearing 07/15/2016  . Myofascial pain syndrome 10/08/2015  . Arthritis 06/20/2015  . HLD (hyperlipidemia) 06/20/2015  . Herpes zoster 04/13/2013  . Metatarsalgia 09/27/2012  . Allergic rhinitis 06/16/2011    Denise Parker 09/04/2016, 12:49 PM  [facility] [address] [place], [place], [address] Phone:  [phone]   Fax:  [phone]  Name: Denise Parker MRN: [id number] Date of Birth: [birth year]  Raeford Razor, PT 09/04/16 12:49 PM Phone: [phone] Fax: [phone]

## 2016-09-09 ENCOUNTER — Ambulatory Visit (INDEPENDENT_AMBULATORY_CARE_PROVIDER_SITE_OTHER): Payer: Medicare Other | Admitting: Family Medicine

## 2016-09-09 ENCOUNTER — Telehealth: Payer: Self-pay | Admitting: Family Medicine

## 2016-09-09 ENCOUNTER — Encounter: Payer: Self-pay | Admitting: Family Medicine

## 2016-09-09 DIAGNOSIS — M810 Age-related osteoporosis without current pathological fracture: Secondary | ICD-10-CM | POA: Diagnosis not present

## 2016-09-09 MED ORDER — ALENDRONATE SODIUM 70 MG PO TABS: 70.0000 mg | ORAL_TABLET | ORAL | 11 refills | Status: DC

## 2016-09-09 NOTE — Telephone Encounter (Signed)
Mailed results to patient

## 2016-09-09 NOTE — Assessment & Plan Note (Signed)
>  25 minutes spent in face to face time with patient, >50% spent in counselling or coordination of care Discussed tx options. Continue weight bearing exercise, increase dietary intake of calcium, continue calcium/vit D supplement. Start fosamax weekly. The patient indicates understanding of these issues and agrees with the plan.

## 2016-09-09 NOTE — Progress Notes (Signed)
Subjective:   Patient ID: Denise Parker, female    DOB: [birth year], 66 y.o.   MRN: [id number]  Denise Parker is a pleasant 66 y.o. year old female who presents to clinic today with Osteoporosis  on 09/09/2016  HPI:  Osteoporosis- new diagnosis. Left forearm-3.4, femur normal bone density.  She does take calcium/vitamin D. Also walks and hikes quite a bit.  Now doing pilates.  No history of fractures in the past 20 years.  Strong family history of osteoporosis.   Dg Bone Density  Result Date: 09/03/2016 EXAM: DUAL X-RAY ABSORPTIOMETRY (DXA) FOR BONE MINERAL DENSITY IMPRESSION: Dear Dr. Ruthe Mannan, Your patient Denise Parker completed a FRAX assessment on 09/02/2016 using the Lunar iDXA DXA System (analysis version: 14.10) manufactured by [employer]. The following summarizes the results of our evaluation. PATIENT BIOGRAPHICAL: Name: Denise Parker Patient ID: [id number] Birth Date: 1950/10/14 Height:    63.5 in. Gender:     Female    Age:        66       Weight:    218.6 lbs. Ethnicity:  White                            Exam Date: 09/02/2016 FRAX* RESULTS:  (version: 3.5) 10-year Probability of Fracture1 Major Osteoporotic Fracture2 Hip Fracture 11.7% 0.7% Population: [place] (Caucasian) Risk Factors: History of Fracture (Adult) Based on Femur (Right) Neck BMD 1 -The 10-year probability of fracture may be lower than reported if the patient has received treatment. 2 -Major Osteoporotic Fracture: Clinical Spine, Forearm, Hip or Shoulder *FRAX is a Armed forces logistics/support/administrative officer of the [employer] of [employer] for Metabolic Bone Disease, a World Science writer (WHO) [employer]. ASSESSMENT: The probability of a major osteoporotic fracture is 11.7% within the next ten years. The probability of a hip fracture is 0.7% within the next ten years. I have reviewed the above report and agree with the findings. Mark A. Tyron Russell, M.D. [facility] Radiology Dear Dr. Ruthe Mannan,  Your patient Denise Parker completed a BMD test on 09/02/2016 using the Lunar iDXA DXA System (analysis version: 14.10) manufactured by [employer]. The following summarizes the results of our evaluation. PATIENT BIOGRAPHICAL: Name: Denise Parker Patient ID: [id number] Birth Date: [birth year] Height:     63.5 in. Weight:     218.6 lbs. Gender: Female Exam Date: 09/02/2016 Indications: Caucasian, Family Hx of Osteoporosis, Height Loss, History of Fracture (Adult), Postmenopausal, Scoliosis Fractures:  right tib/fib, Right wrist Treatments: CALCIUM VIT D, Flonase, Lipitor, Zyrtec ASSESSMENT: The BMD measured at Forearm Radius 33% is 0.580 g/cm2 with a T-score of -3.4. This patient is considered OSTEOPOROTIC according to World Health Organization [facility]) criteria. Lumbar spine was not utilized due to advanced degenerative changes. Site Region Measured Measured WHO Young Adult BMD Date       Age      Classification T-score DualFemur Total Left 09/02/2016 66 Normal -1.0 0.882 g/cm2 Left Forearm Radius 33% 09/02/2016 66 Osteoporosis -3.4 0.580 g/cm2 World Health Organization [facility]) criteria for post-menopausal, Caucasian Women: Normal:       T-score at or above -1 SD Osteopenia:   T-score between -1 and -2.5 SD Osteoporosis: T-score at or below -2.5 SD RECOMMENDATIONS: National Osteoporosis Foundation recommends that FDA-approved medical therapies be considered in postmenopausal women and men age 60 or older with a: 1. Hip or vertebral (clinical or morphometric) fracture. 2. T-score of < -2.5 at the spine  or hip. 3. Ten-year fracture probability by FRAX of 3% or greater for hip fracture or 20% or greater for major osteoporotic fracture. All treatment decisions require clinical judgment and consideration of individual patient factors, including patient preferences, co-morbidities, previous drug use, risk factors not captured in the FRAX model (e.g. falls, vitamin D deficiency, increased bone turnover, interval  significant decline in bone density) and possible under - or over-estimation of fracture risk by FRAX. All patients should ensure an adequate intake of dietary calcium (1200 mg/d) and vitamin D (800 IU daily) unless contraindicated. FOLLOW-UP: People with diagnosed cases of osteoporosis or at high risk for fracture should have regular bone mineral density tests. For patients eligible for Medicare, routine testing is allowed once every 2 years. The testing frequency can be increased to one year for patients who have rapidly progressing disease, those who are receiving or discontinuing medical therapy to restore bone mass, or have additional risk factors. I have reviewed this report, and agree with the above findings. Mark A. Tyron Russell, M.D. [facility] Radiology Electronically Signed   By: Ulyses Southward M.D.   On: 09/03/2016 13:50   Mm Screening Breast Tomo Bilateral  Result Date: 09/03/2016 CLINICAL DATA:  Screening. EXAM: 2D DIGITAL SCREENING BILATERAL MAMMOGRAM WITH CAD AND ADJUNCT TOMO COMPARISON:  Previous exam(s). ACR Breast Density Category c: The breast tissue is heterogeneously dense, which may obscure small masses. FINDINGS: There are no findings suspicious for malignancy. Images were processed with CAD. IMPRESSION: No mammographic evidence of malignancy. A result letter of this screening mammogram will be mailed directly to the patient. RECOMMENDATION: Screening mammogram in one year. (Code:SM-B-01Y) BI-RADS CATEGORY  1: Negative. Electronically Signed   By: Dalphine Handing M.D.   On: 09/03/2016 07:51      Current Outpatient Prescriptions on File Prior to Visit  Medication Sig Dispense Refill  . atorvastatin (LIPITOR) 20 MG tablet TAKE 1 TABLET BY MOUTH EVERY DAY 90 tablet 0  . atorvastatin (LIPITOR) 20 MG tablet TAKE 1 TABLET BY MOUTH DAILY 90 tablet 0  . CALCIUM-VITAMIN D PO Take 600 mg of calcium and 800 units of vitamin D daily    . cetirizine (ZYRTEC) 5 MG tablet Take 2 tablets (10 mg total) by  mouth daily. 30 tablet 6  . CRANBERRY-VITAMIN C PO Take by mouth daily.    . Flaxseed, Linseed, (FLAXSEED OIL PO) Take 1,000 mg by mouth daily.     . fluticasone (FLONASE) 50 MCG/ACT nasal spray Place 1 spray into both nostrils daily. 16 g 6  . GLUCOSAMINE CHONDROITIN COMPLX PO Take by mouth.    Marland Kitchen ibuprofen (ADVIL,MOTRIN) 200 MG tablet Take 400 mg by mouth every 6 (six) hours as needed.    . Turmeric 500 MG TABS Take 500 mg by mouth.    . vitamin C (ASCORBIC ACID) 250 MG tablet Take 250 mg by mouth daily.    . vitamin E 400 UNIT capsule Take 400 Units by mouth daily.     No current facility-administered medications on file prior to visit.     No Known Allergies  Past Medical History:  Diagnosis Date  . HLD (hyperlipidemia)     Past Surgical History:  Procedure Laterality Date  . BREAST CYST ASPIRATION    . TONSILLECTOMY      Family History  Problem Relation Age of Onset  . Heart disease Mother   . Heart disease Father   . Breast cancer Sister 55    Social History   Social History  .  Marital status: Married    Spouse name: N/A  . Number of children: N/A  . Years of education: N/A   Occupational History  . Not on file.   Social History Main Topics  . Smoking status: Never Smoker  . Smokeless tobacco: Never Used  . Alcohol use 1.8 oz/week    3 Glasses of wine per week     Comment: Wine-regular  . Drug use: No  . Sexual activity: Yes   Other Topics Concern  . Not on file   Social History Narrative   Commuting from [place] to [place] every two weeks.   Speech therapist, works with children.   Has one 68 yo son in [place] and 4 grandchildren.   Does not have a living will or HPOA   Desires CPR, would not want prolonged life support if futile.      The PMH, PSH, Social History, Family History, Medications, and allergies [facility], and have been updated if relevant.   Review of Systems  All other systems reviewed and are negative.        Objective:    BP 118/80   Pulse 71   Ht 5\' 4"  (1.626 m)   Wt 223 lb (101.2 kg)   SpO2 98%   BMI 38.28 kg/m    Physical Exam    General:  Well-developed,well-nourished,in no acute distress; alert,appropriate and cooperative throughout examination Head:  normocephalic and atraumatic.   Eyes:  vision grossly intact, PERRL Ears:  R ear normal and L ear normal externally, TMs clear bilaterally Nose:  no external deformity.   Mouth:  good dentition.   Neck:  No deformities, masses, or tenderness noted. Lungs:  Normal respiratory effort, chest expands symmetrically. Lungs are clear to auscultation, no crackles or wheezes. Heart:  Normal rate and regular rhythm. S1 and S2 normal without gallop, murmur, click, rub or other extra sounds. Msk:  No deformity or scoliosis noted of thoracic or lumbar spine.   Extremities:  No clubbing, cyanosis, edema, or deformity noted with normal full range of motion of all joints.   Neurologic:  alert & oriented X3 and gait normal.   Skin:  Intact without suspicious lesions or rashes Cervical Nodes:  No lymphadenopathy noted Axillary Nodes:  No palpable lymphadenopathy Psych:  Cognition and judgment appear intact. Alert and cooperative with normal attention span and concentration. No apparent delusions, illusions, hallucinations      Assessment & Plan:   Osteoporosis, unspecified osteoporosis type, unspecified pathological fracture presence No Follow-up on file.

## 2016-09-09 NOTE — Progress Notes (Signed)
Pre visit review using our clinic review tool, if applicable. No additional management support is needed unless otherwise documented below in the visit note. 

## 2016-09-09 NOTE — Telephone Encounter (Signed)
PT CALLED SHE WOULD LIKE A COPY OF HER COLORGUARD RESULTS MAILED TO HER

## 2016-09-16 ENCOUNTER — Ambulatory Visit: Payer: Medicare Other | Admitting: Physical Therapy

## 2016-09-16 DIAGNOSIS — G8929 Other chronic pain: Secondary | ICD-10-CM

## 2016-09-16 DIAGNOSIS — M6281 Muscle weakness (generalized): Secondary | ICD-10-CM | POA: Diagnosis not present

## 2016-09-16 DIAGNOSIS — M5440 Lumbago with sciatica, unspecified side: Principal | ICD-10-CM

## 2016-09-16 DIAGNOSIS — R262 Difficulty in walking, not elsewhere classified: Secondary | ICD-10-CM | POA: Diagnosis not present

## 2016-09-16 NOTE — Therapy (Signed)
[place], [place], [address] Phone: [phone]   Fax:  [phone]  Physical Therapy Treatment  Patient Details  Name: Denise Parker MRN: [id number] Date of Birth: [birth year] Referring Provider: Dr. Deborra Medina, Dr. Layne Benton   Encounter Date: 09/16/2016      PT End of Session - 09/16/16 1350    Visit Number 8   Number of Visits 16   Date for PT Re-Evaluation 09/23/16   PT Start Time 0930   PT Stop Time 1018   PT Time Calculation (min) 48 min   Activity Tolerance Patient tolerated treatment well   Behavior During Therapy [facility] for tasks assessed/performed      Past Medical History:  Diagnosis Date  . HLD (hyperlipidemia)     Past Surgical History:  Procedure Laterality Date  . BREAST CYST ASPIRATION    . TONSILLECTOMY      There were no vitals filed for this visit.      Subjective Assessment - 09/16/16 0941    Subjective Had  DEXA, I have osteoporosis in L forearm. I want to use some light weights.  Back and Rt. hip hurting today, 3/10. Did alot of standing and food prepping and my back and hip flared up.  Went to [facility] and drove 4 hours and i knew it was bad by the time I got there. WAs about a 6/10 at its worst.  Did her HEP and took meds and it went away quickly.     Currently in Pain? Yes   Pain Score 3    Pain Location Back   Pain Orientation Right;Lower   Pain Descriptors / Indicators Aching   Pain Radiating Towards Rt. hip   Pain Onset More than a month ago   Pain Frequency Intermittent              [facility] Adult PT Treatment/Exercise - 09/16/16 0001      Self-Care   Other Self-Care Comments  osteoporosis, website and tennis ball for self release      Lumbar Exercises: Supine   AB Set Limitations supine pelvic tilt & seated   Bridge 15 reps   Straight Leg Raise 10 reps  2 sets, added arms overhead with 1 - 3 lbs wgt.    Other Supine Lumbar Exercises supine chest fly 2 lbs x  10 , added bridge to another set of 10    Other Supine Lumbar Exercises table top hold 30 sec 3 lbs      Knee/Hip Exercises: Stretches   Active Hamstring Stretch Both;2 reps;30 seconds   Piriformis Stretch Both;2 reps;30 seconds   Other Knee/Hip Stretches figure 4 pull across     Knee/Hip Exercises: Aerobic   Nustep 7 min L5, LE and UE  strengthening      Manual Therapy   Soft tissue mobilization roller R hip, ITB  5 min , discussed self release of hip mm, piriformis                 PT Education - 09/16/16 1348    Education provided Yes   Education Details [employer] website, UE exercises as she works core in supine    [employer]) Educated Patient   Methods Explanation;Handout;Demonstration;Tactile cues;Verbal cues   Comprehension Verbalized understanding;Returned demonstration;Need further instruction          PT Short Term Goals - 09/04/16 1238      PT SHORT TERM GOAL #1   Title Pt will be  I with basic HEP for flexibilty and strength    Status Achieved     PT SHORT TERM GOAL #2   Title Pt will be able to sit for 30 min in typical chair more comfortably, min increase in back pain for meals, rest.    Baseline >30 min back pain increases    Status Partially Met     PT SHORT TERM GOAL #3   Title Pt will continue walk up to 2 miles but with 25% less pain, difficulty.    Status Partially Met     PT SHORT TERM GOAL #4   Title Pt will be able to get up and down stairs (<6 stairs) with only min difficulty in Rt. LE   Status Unable to assess           PT Long Term Goals - 09/04/16 1239      PT LONG TERM GOAL #1   Title Pt will be I with more advanced HEP for posture LE strength and core    Status On-going     PT LONG TERM GOAL #2   Title Pt will be able to walk as needed in the community without increase in pain from baseline for up to 30 min    Status Unable to assess     PT LONG TERM GOAL #3   Title Pt will be able to sleep with only min  occasional sleep disturbance due to back pain.    Status Achieved     PT LONG TERM GOAL #4   Title Pt will demo Rt. LE strength (hip abd, extension) to 4/5 or more for gait stability.    Status Unable to assess     PT LONG TERM GOAL #5   Title FOTO score will improve to less than 35% impaired to dmeo functional improvement.    Status Unable to assess               Plan - 09/16/16 0959    Clinical Impression Statement Utilized 2 lb dumbbells to integrate UEs into Core work for weightbearing on wrist/forearm.  WHile it does not take much for her pain to flare up she is better able to manage her pain with stretches.  Goals in progres, likley renew next visit    PT Next Visit Plan FOTO and GCODE, goals , Pilates, hip abductor strength, core activation   PT Home Exercise Plan prepilates and hamstring stretch , ITB and adductor, PPT, knee to chest    Consulted and Agree with Plan of Care Patient      Patient will benefit from skilled therapeutic intervention in order to improve the following deficits and impairments:  Obesity, Abnormal gait, Pain, Increased fascial restricitons, Decreased strength, Decreased balance, Decreased range of motion, Impaired flexibility, Postural dysfunction  Visit Diagnosis: Chronic right-sided low back pain with sciatica, sciatica laterality unspecified  Difficulty in walking, not elsewhere classified  Muscle weakness (generalized)     Problem List Patient Active Problem List   Diagnosis Date Noted  . Osteoporosis 09/09/2016  . Back pain 07/15/2016  . Decreased hearing 07/15/2016  . Myofascial pain syndrome 10/08/2015  . Arthritis 06/20/2015  . HLD (hyperlipidemia) 06/20/2015  . Herpes zoster 04/13/2013  . Metatarsalgia 09/27/2012  . Allergic rhinitis 06/16/2011    Tymeka Privette 09/16/2016, 3:30 PM  [facility] [address] [place], [place], [address] Phone: [phone]   Fax:   [phone]  Name: Zamyra Allensworth MRN: [id number] Date of Birth:  09/28/1950  Raeford Razor, PT 09/16/16 3:30 PM Phone: [phone] Fax: [phone]

## 2016-09-18 ENCOUNTER — Ambulatory Visit: Payer: Medicare Other | Admitting: Physical Therapy

## 2016-09-18 DIAGNOSIS — G8929 Other chronic pain: Secondary | ICD-10-CM | POA: Diagnosis not present

## 2016-09-18 DIAGNOSIS — M5440 Lumbago with sciatica, unspecified side: Principal | ICD-10-CM

## 2016-09-18 DIAGNOSIS — R262 Difficulty in walking, not elsewhere classified: Secondary | ICD-10-CM | POA: Diagnosis not present

## 2016-09-18 DIAGNOSIS — M6281 Muscle weakness (generalized): Secondary | ICD-10-CM

## 2016-09-18 NOTE — Therapy (Signed)
[place], [place], [address] Phone: [phone]   Fax:  [phone]  Physical Therapy Treatment  Patient Details  Name: Denise Parker MRN: [id number] Date of Birth: February 08, 1951 Referring Provider: Dr. Deborra Medina, Dr. Layne Benton   Encounter Date: 09/18/2016      PT End of Session - 09/18/16 1521    Visit Number 9   Number of Visits 16   Date for PT Re-Evaluation 09/23/16   PT Start Time 5956   PT Stop Time 1552   PT Time Calculation (min) 46 min   Activity Tolerance Patient tolerated treatment well   Behavior During Therapy [facility] for tasks assessed/performed      Past Medical History:  Diagnosis Date  . HLD (hyperlipidemia)     Past Surgical History:  Procedure Laterality Date  . BREAST CYST ASPIRATION    . TONSILLECTOMY      There were no vitals filed for this visit.      Subjective Assessment - 09/18/16 1508    Subjective Still having the most trouble at night after 15 min of laying down, have to switch side to side all night. Havent done much standing today.    Currently in Pain? Yes   Pain Score 0-No pain   Pain Location Back   Pain Orientation Right   Pain Descriptors / Indicators Aching   Pain Type Chronic pain   Pain Onset More than a month ago   Pain Frequency Intermittent   Aggravating Factors  standing to prep food, was a 3/10 this AM    Pain Relieving Factors meds, stretching    Pain Score 2   Pain Location Hip   Pain Orientation Right   Pain Descriptors / Indicators Tender   Pain Type Chronic pain   Pain Onset More than a month ago   Pain Frequency Intermittent   Aggravating Factors  laying on her side    Pain Relieving Factors stretch, sitting, repositioning    Effect of Pain on Daily Activities sleep!           Pilates Tower for LE/Core strength, postural strength, lumbopelvic disassociation and core control.  Exercises included:   Supine Leg [place] yellow Arcs  in parallel x 15 and circles, single leg work only.   L hip patient felt appropriately in hamstrings, Rt. Hip was feeling work in groin and lateral hip.   Arm [place] yellow, arm arcs x 10 and added single SLR x 5 each for added core challenge    Sidelying leg springs yellow hip adduction and sidekicks flex/ext  Small circles x 8 each direction        PT Short Term Goals - 09/18/16 1523      PT SHORT TERM GOAL #1   Title Pt will be I with basic HEP for flexibilty and strength    Status Achieved     PT SHORT TERM GOAL #2   Title Pt will be able to sit for 30 min in typical chair more comfortably, min increase in back pain for meals, rest.    Status Partially Met     PT SHORT TERM GOAL #3   Title Pt will continue walk up to 2 miles but with 25% less pain, difficulty.    Baseline flare   Status Partially Met     PT SHORT TERM GOAL #4   Title Pt will be able to get up and down stairs (<6 stairs) with only min difficulty in Rt. LE  Baseline still a challenge in basement stairs, outside is ok.   Status Partially Met           PT Long Term Goals - 09/04/16 1239      PT LONG TERM GOAL #1   Title Pt will be I with more advanced HEP for posture LE strength and core    Status On-going     PT LONG TERM GOAL #2   Title Pt will be able to walk as needed in the community without increase in pain from baseline for up to 30 min    Status Unable to assess     PT LONG TERM GOAL #3   Title Pt will be able to sleep with only min occasional sleep disturbance due to back pain.    Status Achieved     PT LONG TERM GOAL #4   Title Pt will demo Rt. LE strength (hip abd, extension) to 4/5 or more for gait stability.    Status Unable to assess     PT LONG TERM GOAL #5   Title FOTO score will improve to less than 35% impaired to dmeo functional improvement.    Status Unable to assess               Plan - 09/18/16 1547    Clinical Impression Statement Worked on the [employer] for stretching of hips and stabilzation of lumbar spine /pelvis.  Pt felt muscle fatigue and work but no increase in back pain.  She had difficulty with bridging, maintaining a bridge with added UE exercises.  She walked with 1.5 lb dumbbells this AM as well  for bone building. Her FOTO score did not improve but subjectively she is improved.     PT Next Visit Plan renewal, check MMT and L spine ARom (FOTO is done) Pilates, hip abductor strength, core activation   PT Home Exercise Plan prepilates and hamstring stretch , ITB and adductor, PPT, knee to chest    Consulted and Agree with Plan of Care Patient      Patient will benefit from skilled therapeutic intervention in order to improve the following deficits and impairments:  Obesity, Abnormal gait, Pain, Increased fascial restricitons, Decreased strength, Decreased balance, Decreased range of motion, Impaired flexibility, Postural dysfunction  Visit Diagnosis: Chronic right-sided low back pain with sciatica, sciatica laterality unspecified  Difficulty in walking, not elsewhere classified  Muscle weakness (generalized)     Problem List Patient Active Problem List   Diagnosis Date Noted  . Osteoporosis 09/09/2016  . Back pain 07/15/2016  . Decreased hearing 07/15/2016  . Myofascial pain syndrome 10/08/2015  . Arthritis 06/20/2015  . HLD (hyperlipidemia) 06/20/2015  . Herpes zoster 04/13/2013  . Metatarsalgia 09/27/2012  . Allergic rhinitis 06/16/2011    Denise Parker 09/18/2016, 3:57 PM  [facility] [address] [place], [place], [address] Phone: [phone]   Fax:  [phone]  Name: Denise Parker MRN: [id number] Date of Birth: 07/15/1950  Raeford Razor, PT 09/18/16 3:57 PM Phone: [phone] Fax: [phone]

## 2016-09-23 ENCOUNTER — Ambulatory Visit: Payer: Medicare Other | Admitting: Physical Therapy

## 2016-09-23 ENCOUNTER — Encounter: Payer: Self-pay | Admitting: Physical Therapy

## 2016-09-23 DIAGNOSIS — M6281 Muscle weakness (generalized): Secondary | ICD-10-CM

## 2016-09-23 DIAGNOSIS — M5440 Lumbago with sciatica, unspecified side: Secondary | ICD-10-CM | POA: Diagnosis not present

## 2016-09-23 DIAGNOSIS — G8929 Other chronic pain: Secondary | ICD-10-CM | POA: Diagnosis not present

## 2016-09-23 DIAGNOSIS — R262 Difficulty in walking, not elsewhere classified: Secondary | ICD-10-CM | POA: Diagnosis not present

## 2016-09-23 NOTE — Therapy (Signed)
[place], [place], [address] Phone: [phone]   Fax:  [phone]  Physical Therapy Treatment/Renewal Patient Details  Name: Denise Parker MRN: [id number] Date of Birth: 08/11/1950 Referring Provider: Dr. Deborra Medina, Dr. Layne Benton   Encounter Date: 09/23/2016      PT End of Session - 09/23/16 1009    Visit Number 10   Number of Visits 16   Date for PT Re-Evaluation 10/24/16   PT Start Time 0935   PT Stop Time 1016   PT Time Calculation (min) 41 min   Activity Tolerance Patient tolerated treatment well   Behavior During Therapy [facility] for tasks assessed/performed      Past Medical History:  Diagnosis Date  . HLD (hyperlipidemia)     Past Surgical History:  Procedure Laterality Date  . BREAST CYST ASPIRATION    . TONSILLECTOMY      There were no vitals filed for this visit.      Subjective Assessment - 09/23/16 1309    Subjective Pain in Rt. hip is creeping up.  Back is doing OK.  Walked 4 days last week which is the most I have done   Currently in Pain? Yes   Pain Score 0-No pain   Pain Location Back   Aggravating Factors  standing for meal prep    Pain Score 2   Pain Location Hip   Pain Orientation Right   Pain Descriptors / Indicators Tender;Aching   Pain Type Chronic pain   Pain Onset More than a month ago   Pain Frequency Intermittent   Aggravating Factors  sleeping   Pain Relieving Factors exercies, turning over             [facility] PT Assessment - 09/23/16 0001      Observation/Other Assessments   Focus on Therapeutic Outcomes (FOTO)  48%     AROM   Overall AROM Comments WFL stretch except with extension min pain Rt. side extension, pinching on Rt. side with Rt. sidebending      Strength   Right Hip Flexion 3+/5   Right Hip ABduction 3/5   Left Hip Flexion 4+/5   Right Knee Flexion 5/5   Right Knee Extension 5/5   Left Knee Flexion 5/5   Left Knee Extension 5/5              [facility] Adult PT Treatment/Exercise - 09/23/16 0001      Lumbar Exercises: Quadruped   Single Arm Raise 5 reps   Straight Leg Raise 5 reps   Opposite Arm/Leg Raise 5 reps   Other Quadruped Lumbar Exercises childs pose 30 sec x 3   1 x lateral for Rt. side      Knee/Hip Exercises: Standing   Hip Abduction Stengthening;Both;1 set;10 reps   Lateral Step Up Both;1 set;20 reps;Hand Hold: 1   Lateral Step Up Limitations 6 inch      Knee/Hip Exercises: Sidelying   Hip ABduction Strengthening;Both;2 sets;10 reps   Clams x 20                 PT Education - 09/23/16 1311    Education provided Yes   Education Details renewal, function of hip muscles , HEP    Person(s) Educated Patient   Methods Explanation;Handout;Tactile cues;Verbal cues;Demonstration   Comprehension Verbalized understanding;Returned demonstration          PT Short Term Goals - 09/23/16 0952      PT SHORT TERM GOAL #1  Title Pt will be I with basic HEP for flexibilty and strength    Status Achieved     PT SHORT TERM GOAL #2   Title Pt will be able to sit for 30 min in typical chair more comfortably, min increase in back pain for meals, rest.    Status Partially Met     PT SHORT TERM GOAL #3   Title Pt will continue walk up to 2 miles but with 25% less pain, difficulty.    Baseline walked 4 days last week, stayed at baseline level of pain    Status Achieved     PT SHORT TERM GOAL #4   Title Pt will be able to get up and down stairs (<6 stairs) with only min difficulty in Rt. LE   Status Partially Met           PT Long Term Goals - 09/23/16 0943      PT LONG TERM GOAL #1   Title Pt will be I with more advanced HEP for posture LE strength and core    Status On-going     PT LONG TERM GOAL #2   Title Pt will be able to walk as needed in the community without increase in pain from baseline for up to 30 min    Status Achieved     PT LONG TERM GOAL #3   Title Pt will be able to  sleep with only min occasional sleep disturbance due to back pain.    Baseline improved , was met 2 weeks ago.  Sleep is back to being difficult.    Status Partially Met     PT LONG TERM GOAL #4   Title Pt will demo Rt. LE strength (hip abd, extension) to 4/5 or more for gait stability.    Baseline 3/5 to 3+/5    Status On-going     PT LONG TERM GOAL #5   Title FOTO score will improve to less than 35% impaired to dmeo functional improvement.    Baseline was same as initial eval , 48%    Status On-going     Additional Long Term Goals   Additional Long Term Goals Yes     PT LONG TERM GOAL #6   Title Pt will consistenty walk 4 miles at least 4 times per week and maintain pain level (<2/10)    Time 4   Period Weeks   Status New               Plan - 09/23/16 1016    Clinical Impression Statement Denise Parker was renewed today for 4 more weeks.  Prior to last week she was improving a great, deal, experienced a set back as she was on a vacation.  She now continues to have low levels of low back pain but pain is more in Rt. hip, some in QL muscle. She has been able to increase her walking for fitness routine up to 2 miles.  Standing long periods (>30 min) is a challenge.She plans to join a PI[employer]ates mat class in Sept.  SHe will benefit from PT to improve ability to stand, walk and maximize her mobility.     PT Next Visit Plan  Pilates, hip abductor strength, core activation   PT Home Exercise Plan prepilates and hamstring stretch , ITB and adductor, PPT, knee to chest    Consulted and Agree with Plan of Care Patient      Patient will benefit from skilled therapeutic intervention in  order to improve the following deficits and impairments:  Obesity, Abnormal gait, Pain, Increased fascial restricitons, Decreased strength, Decreased balance, Decreased range of motion, Impaired flexibility, Postural dysfunction  Visit Diagnosis: Chronic right-sided low back pain with sciatica, sciatica  laterality unspecified  Difficulty in walking, not elsewhere classified  Muscle weakness (generalized)       G-Codes - Oct 17, 2016 1311    Functional Assessment Tool Used (Outpatient Only) FOTO   Functional Limitation Mobility: Walking and moving around   Mobility: Walking and Moving Around Current Status [phone]) At least 40 percent but less than 60 percent impaired, limited or restricted   Mobility: Walking and Moving Around Goal Status [phone]) At least 40 percent but less than 60 percent impaired, limited or restricted      Problem List Patient Active Problem List   Diagnosis Date Noted  . Osteoporosis 09/09/2016  . Back pain 07/15/2016  . Decreased hearing 07/15/2016  . Myofascial pain syndrome 10/08/2015  . Arthritis 06/20/2015  . HLD (hyperlipidemia) 06/20/2015  . Herpes zoster 04/13/2013  . Metatarsalgia 09/27/2012  . Allergic rhinitis 06/16/2011    Jahson Emanuele 10-17-16, 1:13 PM  [facility] [address] [place], [place], [address] Phone: [phone]   Fax:  [phone]  Name: Denise Parker MRN: [id number] Date of Birth: [birth year]   Raeford Razor, PT October 17, 2016 1:14 PM Phone: [phone] Fax: [phone]

## 2016-09-23 NOTE — Patient Instructions (Addendum)
Abduction: Clam (Eccentric) - Side-Lying    Lie on side with knees bent. Lift top knee, keeping feet together. Keep trunk steady. Slowly lower for 3-5 seconds. __10-20_ reps per set, __1-2_ sets per day, __5-7_ days per week. Add ___ lbs when you achieve ___ repetitions.  http://ecce.exer.us/65   Copyright  VHI. All rights reserved.    ADD in a chest fly (arms out just below shoulder height) do with back down on the mat and also in a bridge position for added challenge   Quadratus Lumborum stretch from cabinet

## 2016-09-25 ENCOUNTER — Encounter: Payer: Private Health Insurance - Indemnity | Admitting: Physical Therapy

## 2016-09-30 ENCOUNTER — Encounter: Payer: Self-pay | Admitting: Physical Therapy

## 2016-09-30 ENCOUNTER — Encounter: Payer: Private Health Insurance - Indemnity | Admitting: Physical Therapy

## 2016-09-30 ENCOUNTER — Ambulatory Visit: Payer: Medicare Other | Attending: Family Medicine | Admitting: Physical Therapy

## 2016-09-30 DIAGNOSIS — G8929 Other chronic pain: Secondary | ICD-10-CM | POA: Diagnosis not present

## 2016-09-30 DIAGNOSIS — R262 Difficulty in walking, not elsewhere classified: Secondary | ICD-10-CM

## 2016-09-30 DIAGNOSIS — M5440 Lumbago with sciatica, unspecified side: Secondary | ICD-10-CM | POA: Diagnosis not present

## 2016-09-30 DIAGNOSIS — M6281 Muscle weakness (generalized): Secondary | ICD-10-CM | POA: Diagnosis not present

## 2016-09-30 NOTE — Therapy (Signed)
[place], [place], [address] Phone: [phone]   Fax:  [phone]  Physical Therapy Treatment  Patient Details  Name: Denise Parker MRN: [id number] Date of Birth: 07-18-1950 Referring Provider: Dr. Deborra Medina, Dr. Layne Benton   Encounter Date: 09/30/2016      PT End of Session - 09/30/16 1414    Visit Number 11   Number of Visits 16   Date for PT Re-Evaluation 10/24/16   PT Start Time 1329   PT Stop Time 1414   PT Time Calculation (min) 45 min   Activity Tolerance Patient tolerated treatment well   Behavior During Therapy [facility] for tasks assessed/performed      Past Medical History:  Diagnosis Date  . HLD (hyperlipidemia)     Past Surgical History:  Procedure Laterality Date  . BREAST CYST ASPIRATION    . TONSILLECTOMY      There were no vitals filed for this visit.      Subjective Assessment - 09/30/16 1332    Subjective Really having no back pain.  Rt. hip pain is minimal.  Pt arrived early and sat for 1 hour prior to PT. my steps at home are 7 inches high.    Currently in Pain? Yes   Pain Score 1    Pain Location Hip   Pain Orientation Right   Pain Descriptors / Indicators Aching   Pain Type Chronic pain   Pain Onset More than a month ago   Pain Frequency Intermittent   Multiple Pain Sites No   Pain Location Back             [facility] Adult PT Treatment/Exercise - 09/30/16 0001      Lumbar Exercises: Stretches   Piriformis Stretch 2 reps;30 seconds     Lumbar Exercises: Supine   Other Supine Lumbar Exercises chest lift x 10, added legs in table top only able to do 4 reps      Knee/Hip Exercises: Stretches   Piriformis Stretch Both;2 reps;30 seconds     Knee/Hip Exercises: Aerobic   Elliptical 5 min with arms, ramp 10 for 2 min and 1 for remaining 3 min      Knee/Hip Exercises: Standing   Side Lunges Both;1 set;10 reps   Hip Abduction Stengthening;Both;1 set;10 reps   Lateral Step Up Both;1 set;20 reps;Hand Hold: 1   Lateral Step Up Limitations 6 inch    Forward Step Up Both;1 set;10 reps;Hand Hold: 1;Step Height: 6"   Functional Squat 1 set;10 reps   Other Standing Knee Exercises reverse lunge x 10 each leg    Other Standing Knee Exercises lateral walking inn squat no band 5 x 10 feet , plie' squat x 10                   PT Short Term Goals - 09/23/16 6256      PT SHORT TERM GOAL #1   Title Pt will be I with basic HEP for flexibilty and strength    Status Achieved     PT SHORT TERM GOAL #2   Title Pt will be able to sit for 30 min in typical chair more comfortably, min increase in back pain for meals, rest.    Status Partially Met     PT SHORT TERM GOAL #3   Title Pt will continue walk up to 2 miles but with 25% less pain, difficulty.    Baseline walked 4 days last week, stayed at baseline  level of pain    Status Achieved     PT SHORT TERM GOAL #4   Title Pt will be able to get up and down stairs (<6 stairs) with only min difficulty in Rt. LE   Status Partially Met           PT Long Term Goals - 09/30/16 1637      PT LONG TERM GOAL #1   Title Pt will be I with more advanced HEP for posture LE strength and core    Status On-going     PT LONG TERM GOAL #2   Title Pt will be able to walk as needed in the community without increase in pain from baseline for up to 30 min    Status Achieved     PT LONG TERM GOAL #3   Title Pt will be able to sleep with only min occasional sleep disturbance due to back pain.    Status On-going     PT LONG TERM GOAL #4   Title Pt will demo Rt. LE strength (hip abd, extension) to 4/5 or more for gait stability.    Status On-going     PT LONG TERM GOAL #5   Title FOTO score will improve to less than 35% impaired to dmeo functional improvement.    Baseline was same as initial eval , 48%    Status On-going               Plan - 09/30/16 1636    Clinical Impression Statement Able to  challenge hip and core strength in standing for the majority of her session.  Most difficult for her was standing hip abd off a step. Cont with difficulty standing long periods and sleeping.  Improving.    PT Next Visit Plan  Pilates, hip abductor strength, core activation   PT Home Exercise Plan prepilates and hamstring stretch , ITB and adductor, PPT, knee to chest    Consulted and Agree with Plan of Care Patient      Patient will benefit from skilled therapeutic intervention in order to improve the following deficits and impairments:  Obesity, Abnormal gait, Pain, Increased fascial restricitons, Decreased strength, Decreased balance, Decreased range of motion, Impaired flexibility, Postural dysfunction  Visit Diagnosis: Chronic right-sided low back pain with sciatica, sciatica laterality unspecified  Difficulty in walking, not elsewhere classified  Muscle weakness (generalized)     Problem List Patient Active Problem List   Diagnosis Date Noted  . Osteoporosis 09/09/2016  . Back pain 07/15/2016  . Decreased hearing 07/15/2016  . Myofascial pain syndrome 10/08/2015  . Arthritis 06/20/2015  . HLD (hyperlipidemia) 06/20/2015  . Herpes zoster 04/13/2013  . Metatarsalgia 09/27/2012  . Allergic rhinitis 06/16/2011    PAA,JENNIFER 09/30/2016, 4:38 PM  [facility] [address] [place], [place], [address] Phone: [phone]   Fax:  [phone]  Name: Little Bashore MRN: [id number] Date of Birth: 11-05-1950  Raeford Razor, PT 09/30/16 4:39 PM Phone: [phone] Fax: [phone]

## 2016-10-02 ENCOUNTER — Ambulatory Visit: Payer: Medicare Other | Admitting: Physical Therapy

## 2016-10-02 ENCOUNTER — Encounter: Payer: Self-pay | Admitting: Physical Therapy

## 2016-10-02 ENCOUNTER — Encounter: Payer: Private Health Insurance - Indemnity | Admitting: Physical Therapy

## 2016-10-02 DIAGNOSIS — M6281 Muscle weakness (generalized): Secondary | ICD-10-CM | POA: Diagnosis not present

## 2016-10-02 DIAGNOSIS — R262 Difficulty in walking, not elsewhere classified: Secondary | ICD-10-CM

## 2016-10-02 DIAGNOSIS — M5440 Lumbago with sciatica, unspecified side: Secondary | ICD-10-CM | POA: Diagnosis not present

## 2016-10-02 DIAGNOSIS — G8929 Other chronic pain: Secondary | ICD-10-CM | POA: Diagnosis not present

## 2016-10-02 NOTE — Therapy (Signed)
Denise Parker, [place], [address] Phone: [phone]   Fax:  [phone]  Physical Therapy Treatment  Patient Details  Name: Denise Parker MRN: [id number] Date of Birth: 1950-08-14 Referring Provider: Dr. Deborra Medina, Dr. Layne Benton   Encounter Date: 10/02/2016      PT End of Session - 10/02/16 1618    Visit Number 12   Number of Visits 16   Date for PT Re-Evaluation 10/24/16   PT Start Time 0973   PT Stop Time 1616   PT Time Calculation (min) 43 min   Activity Tolerance Patient tolerated treatment well   Behavior During Therapy [facility] for tasks assessed/performed      Past Medical History:  Diagnosis Date  . HLD (hyperlipidemia)     Past Surgical History:  Procedure Laterality Date  . BREAST CYST ASPIRATION    . TONSILLECTOMY      There were no vitals filed for this visit.      Subjective Assessment - 10/02/16 1536    Subjective Back is about a 2/10 and hip is 1/10.  Had company in town, did regular routine.              Denise Parker Adult PT Treatment/Exercise - 10/02/16 0001      Knee/Hip Exercises: Machines for Strengthening   Other Machine Pilates Reformer see note         Pilates Reformer used for LE/core strength, postural strength, lumbopelvic disassociation and core control.  Exercises included:  Footwork sidelying 1 Red 1 yellow for hip abduction in parallel, turn out and ER with heels lifted x 20 each , piriformis stretch each LE 60 sec x 1   Used ball under sacrum to destabilize core double and single leg 2 Red 1.Blue   Knee to chest 2 x 20 sec   Bridging [place] for stability x 8   Clam with blue band alternating x 10 each   Mermaid 1 Red x 5 each side, added a rotation for 2-3 reps. Pt needed max cues to sidebend and relax weightbearing shoulder         PT Education - 10/02/16 1617    Education provided Yes   Education Details spinal motion (sidebending) , will try  dry needling again    Person(s) Educated Patient   Methods Explanation   Comprehension Verbalized understanding          PT Short Term Goals - 09/23/16 5329      PT SHORT TERM GOAL #1   Title Pt will be I with basic HEP for flexibilty and strength    Status Achieved     PT SHORT TERM GOAL #2   Title Pt will be able to sit for 30 min in typical chair more comfortably, min increase in back pain for meals, rest.    Status Partially Met     PT SHORT TERM GOAL #3   Title Pt will continue walk up to 2 miles but with 25% less pain, difficulty.    Baseline walked 4 days last week, stayed at baseline level of pain    Status Achieved     PT SHORT TERM GOAL #4   Title Pt will be able to get up and down stairs (<6 stairs) with only min difficulty in Rt. LE   Status Partially Met           PT Long Term Goals - 10/02/16 1620      PT LONG TERM GOAL #  1   Title Pt will be I with more advanced HEP for posture LE strength and core    Status On-going     PT LONG TERM GOAL #2   Title Pt will be able to walk as needed in the community without increase in pain from baseline for up to 30 min    Status Achieved     PT LONG TERM GOAL #3   Title Pt will be able to sleep with only min occasional sleep disturbance due to back pain.    Baseline improved , was met 2 weeks ago.  Sleep is back to being difficult.    Status On-going     PT LONG TERM GOAL #4   Title Pt will demo Rt. LE strength (hip abd, extension) to 4/5 or more for gait stability.    Baseline 3/5 to 3+/5    Status On-going     PT LONG TERM GOAL #5   Title FOTO score will improve to less than 35% impaired to dmeo functional improvement.    Status On-going     PT LONG TERM GOAL #6   Title Pt will consistenty walk 4 miles at least 4 times per week and maintain pain level (<2/10)    Status On-going               Plan - 10/02/16 1618    Clinical Impression Statement Patient continues to benefit from core and hip  strengthening for back and hip pain.  She can stand shorter periods without interruption of pain. Would like to try dry needling again.  Difficulty with bridging and mermaid today on Reformer (spine articulation in multiple planes.).  No pain just fatigue post session.    PT Next Visit Plan  Pilates, hip abductor strength, core activation, DN    PT Home Exercise Plan prepilates and hamstring stretch , ITB and adductor, PPT, knee to chest    Consulted and Agree with Plan of Care Patient      Patient will benefit from skilled therapeutic intervention in order to improve the following deficits and impairments:  Obesity, Abnormal gait, Pain, Increased fascial restricitons, Decreased strength, Decreased balance, Decreased range of motion, Impaired flexibility, Postural dysfunction  Visit Diagnosis: Chronic right-sided low back pain with sciatica, sciatica laterality unspecified  Difficulty in walking, not elsewhere classified  Muscle weakness (generalized)     Problem List Patient Active Problem List   Diagnosis Date Noted  . Osteoporosis 09/09/2016  . Back pain 07/15/2016  . Decreased hearing 07/15/2016  . Myofascial pain syndrome 10/08/2015  . Arthritis 06/20/2015  . HLD (hyperlipidemia) 06/20/2015  . Herpes zoster 04/13/2013  . Metatarsalgia 09/27/2012  . Allergic rhinitis 06/16/2011    Denise Parker 10/02/2016, 4:21 PM  [facility] [address] [place], [place], [address] Phone: [phone]   Fax:  [phone]  Name: Denise Parker MRN: [id number] Date of Birth: 05/25/1950  Denise Parker, PT 10/02/16 4:22 PM Phone: [phone] Fax: [phone]

## 2016-10-07 ENCOUNTER — Ambulatory Visit: Payer: Medicare Other | Admitting: Physical Therapy

## 2016-10-07 ENCOUNTER — Encounter: Payer: Self-pay | Admitting: Physical Therapy

## 2016-10-07 ENCOUNTER — Encounter: Payer: Private Health Insurance - Indemnity | Admitting: Physical Therapy

## 2016-10-07 DIAGNOSIS — R262 Difficulty in walking, not elsewhere classified: Secondary | ICD-10-CM | POA: Diagnosis not present

## 2016-10-07 DIAGNOSIS — M5440 Lumbago with sciatica, unspecified side: Principal | ICD-10-CM

## 2016-10-07 DIAGNOSIS — M6281 Muscle weakness (generalized): Secondary | ICD-10-CM

## 2016-10-07 DIAGNOSIS — G8929 Other chronic pain: Secondary | ICD-10-CM

## 2016-10-07 NOTE — Therapy (Signed)
[place], [place], [address] Phone: [phone]   Fax:  [phone]  Physical Therapy Treatment  Patient Details  Name: Denise Parker MRN: [id number] Date of Birth: November 29, 1950 Referring Provider: Dr. Deborra Medina, Dr. Layne Benton   Encounter Date: 10/07/2016      PT End of Session - 10/07/16 2212    Visit Number 13   Number of Visits 16   Date for PT Re-Evaluation 10/24/16   PT Start Time 1330   PT Stop Time 1415   PT Time Calculation (min) 45 min   Activity Tolerance Patient tolerated treatment well   Behavior During Therapy [facility] for tasks assessed/performed      Past Medical History:  Diagnosis Date  . HLD (hyperlipidemia)     Past Surgical History:  Procedure Laterality Date  . BREAST CYST ASPIRATION    . TONSILLECTOMY      There were no vitals filed for this visit.      Subjective Assessment - 10/07/16 1339    Subjective 2/10 back and hip.  Yesterday had some bad pain, it usually worsens at night.     Currently in Pain? Yes   Pain Score 2    Pain Location Hip   Pain Orientation Right   Pain Descriptors / Indicators Aching   Pain Type Chronic pain   Pain Onset More than a month ago   Pain Frequency Intermittent            [facility] PT Assessment - 10/07/16 0001      Strength   Right Hip Flexion 3/5  knee ext supine    Left Hip Flexion 4-/5  knee ext                     [facility] Adult PT Treatment/Exercise - 10/07/16 0001      Lumbar Exercises: Stretches   Piriformis Stretch 2 reps;30 seconds     Lumbar Exercises: Sidelying   Clam 20 reps   Hip Abduction 10 reps   Hip Abduction Weights (lbs) 2 sets   Other Sidelying Lumbar Exercises bilateral leg lift x 5      Knee/Hip Exercises: Stretches   Ambulance person reps     Knee/Hip Exercises: Standing   Hip Abduction Stengthening;Both;1 set;10 reps   Lateral Step Up Both;1 set;20 reps;Hand Hold: 1   Lateral  Step Up Limitations 6 inch    Forward Step Up Both;1 set;20 reps;Hand Hold: 1;Step Height: 6"     Knee/Hip Exercises: Supine   Straight Leg Raises Strengthening;Both;2 sets   Straight Leg Raises Limitations much harder on Rt. LE      Knee/Hip Exercises: Prone   Hip Extension Strengthening;Both;1 set;10 reps   Straight Leg Raises Limitations knee bent x 20 each side      Modalities   Modalities Ultrasound     Ultrasound   Ultrasound Location Rt. glute med   Ultrasound Parameters 1.2 W/cm2 and 50% duty,! 1 MHz    Ultrasound Goals Pain                PT Education - 10/07/16 1427    Education provided Yes   Education Details HEP, motions of the hip, strength    Person(s) Educated Patient   Methods Explanation   Comprehension Verbalized understanding          PT Short Term Goals - 09/23/16 0952      PT SHORT TERM GOAL #1   Title Pt  will be I with basic HEP for flexibilty and strength    Status Achieved     PT SHORT TERM GOAL #2   Title Pt will be able to sit for 30 min in typical chair more comfortably, min increase in back pain for meals, rest.    Status Partially Met     PT SHORT TERM GOAL #3   Title Pt will continue walk up to 2 miles but with 25% less pain, difficulty.    Baseline walked 4 days last week, stayed at baseline level of pain    Status Achieved     PT SHORT TERM GOAL #4   Title Pt will be able to get up and down stairs (<6 stairs) with only min difficulty in Rt. LE   Status Partially Met           PT Long Term Goals - 10/02/16 1620      PT LONG TERM GOAL #1   Title Pt will be I with more advanced HEP for posture LE strength and core    Status On-going     PT LONG TERM GOAL #2   Title Pt will be able to walk as needed in the community without increase in pain from baseline for up to 30 min    Status Achieved     PT LONG TERM GOAL #3   Title Pt will be able to sleep with only min occasional sleep disturbance due to back pain.     Baseline improved , was met 2 weeks ago.  Sleep is back to being difficult.    Status On-going     PT LONG TERM GOAL #4   Title Pt will demo Rt. LE strength (hip abd, extension) to 4/5 or more for gait stability.    Baseline 3/5 to 3+/5    Status On-going     PT LONG TERM GOAL #5   Title FOTO score will improve to less than 35% impaired to dmeo functional improvement.    Status On-going     PT LONG TERM GOAL #6   Title Pt will consistenty walk 4 miles at least 4 times per week and maintain pain level (<2/10)    Status On-going               Plan - 10/07/16 2213    Clinical Impression Statement Reviewed step and mat exercises for hip/glute strength, obvious asymmetry with respect to strength and ability to push up, control RtLE vs Lt LE.  She was able to walk and stretch afterwards, taking her pain away.  Trial of [place] to reduce muscle soreness in Rt. lateral hip rotators.    PT Next Visit Plan  Pilates, hip abductor strength, core activation, DN    PT Home Exercise Plan prepilates and hamstring stretch , ITB and adductor, PPT, knee to chest , step ups FW and lateral   Consulted and Agree with Plan of Care Patient      Patient will benefit from skilled therapeutic intervention in order to improve the following deficits and impairments:  Obesity, Abnormal gait, Pain, Increased fascial restricitons, Decreased strength, Decreased balance, Decreased range of motion, Impaired flexibility, Postural dysfunction  Visit Diagnosis: No diagnosis found.     Problem List Patient Active Problem List   Diagnosis Date Noted  . Osteoporosis 09/09/2016  . Back pain 07/15/2016  . Decreased hearing 07/15/2016  . Myofascial pain syndrome 10/08/2015  . Arthritis 06/20/2015  . HLD (hyperlipidemia) 06/20/2015  . Herpes zoster  04/13/2013  . Metatarsalgia 09/27/2012  . Allergic rhinitis 06/16/2011    Sterling Ucci 10/07/2016, 10:16 PM  [facility] [address] [place], [place], [address] Phone: [phone]   Fax:  [phone]  Name: Denise Parker MRN: [id number] Date of Birth: 1950/12/22  Raeford Razor, PT 10/07/16 10:16 PM Phone: [phone] Fax: [phone]

## 2016-10-09 ENCOUNTER — Encounter: Payer: Self-pay | Admitting: Physical Therapy

## 2016-10-09 ENCOUNTER — Ambulatory Visit: Payer: Medicare Other | Admitting: Physical Therapy

## 2016-10-09 ENCOUNTER — Encounter: Payer: Private Health Insurance - Indemnity | Admitting: Physical Therapy

## 2016-10-09 DIAGNOSIS — G8929 Other chronic pain: Secondary | ICD-10-CM

## 2016-10-09 DIAGNOSIS — M6281 Muscle weakness (generalized): Secondary | ICD-10-CM | POA: Diagnosis not present

## 2016-10-09 DIAGNOSIS — M5440 Lumbago with sciatica, unspecified side: Secondary | ICD-10-CM

## 2016-10-09 DIAGNOSIS — R262 Difficulty in walking, not elsewhere classified: Secondary | ICD-10-CM

## 2016-10-09 NOTE — Therapy (Signed)
[place], [place], [address] Phone: [phone]   Fax:  [phone]  Physical Therapy Treatment  Patient Details  Name: Denise Parker MRN: [id number] Date of Birth: 06/27/1950 Referring Provider: Dr. Deborra Medina, Dr. Layne Benton   Encounter Date: 10/09/2016      PT End of Session - 10/09/16 1502    Visit Number 14   Number of Visits 16   Date for PT Re-Evaluation 10/24/16   PT Start Time 1502   PT Stop Time 1550   PT Time Calculation (min) 48 min   Activity Tolerance Patient tolerated treatment well   Behavior During Therapy [facility] for tasks assessed/performed      Past Medical History:  Diagnosis Date  . HLD (hyperlipidemia)     Past Surgical History:  Procedure Laterality Date  . BREAST CYST ASPIRATION    . TONSILLECTOMY      There were no vitals filed for this visit.      Subjective Assessment - 10/09/16 1502    Subjective R lower back is sore that can do down to middle buttock. Ultrasound was very helpful. Still uncomfortable to sleep on R side.    Patient Stated Goals Pt would like to sleep better, more regular exercise routine (used to walk 4 miles x 4 times per week) current 2-3 miles, 2-3 per week more slowly.    Currently in Pain? Yes   Pain Score 3    Pain Location Back   Pain Orientation Right;Lower   Pain Descriptors / Indicators Sore;Spasm                         [facility] Adult PT Treatment/Exercise - 10/09/16 0001      Lumbar Exercises: Stretches   Prone Mid Back Stretch Limitations child pose with sidebend bias     Lumbar Exercises: Supine   Bent Knee Raise Limitations alt marches with ab set   Straight Leg Raises Limitations SLR with opp UE reaching for shin   Other Supine Lumbar Exercises bent knee fall outs with ab set   Other Supine Lumbar Exercises table top holds     Knee/Hip Exercises: Stretches   Gastroc Stretch 2 reps;30 seconds;Both   Gastroc  Stretch Limitations slant board     Knee/Hip Exercises: Aerobic   Elliptical 2 min L1 ramp 5     Moist Heat Therapy   Number Minutes Moist Heat 10 Minutes   Moist Heat Location Lumbar Spine  R side     Manual Therapy   Manual Therapy Soft tissue mobilization   Manual therapy comments skilled palpation & monitoring while dry needling   Soft tissue mobilization glut max & QL          Trigger Point Dry Needling - 10/09/16 1521    Muscles Treated Lower Body Gluteus maximus  Quadratus lumborum   Gluteus Maximus Response Twitch response elicited;Palpable increased muscle length                PT Short Term Goals - 09/23/16 9622      PT SHORT TERM GOAL #1   Title Pt will be I with basic HEP for flexibilty and strength    Status Achieved     PT SHORT TERM GOAL #2   Title Pt will be able to sit for 30 min in typical chair more comfortably, min increase in back pain for meals, rest.    Status Partially Met  PT SHORT TERM GOAL #3   Title Pt will continue walk up to 2 miles but with 25% less pain, difficulty.    Baseline walked 4 days last week, stayed at baseline level of pain    Status Achieved     PT SHORT TERM GOAL #4   Title Pt will be able to get up and down stairs (<6 stairs) with only min difficulty in Rt. LE   Status Partially Met           PT Long Term Goals - 10/02/16 1620      PT LONG TERM GOAL #1   Title Pt will be I with more advanced HEP for posture LE strength and core    Status On-going     PT LONG TERM GOAL #2   Title Pt will be able to walk as needed in the community without increase in pain from baseline for up to 30 min    Status Achieved     PT LONG TERM GOAL #3   Title Pt will be able to sleep with only min occasional sleep disturbance due to back pain.    Baseline improved , was met 2 weeks ago.  Sleep is back to being difficult.    Status On-going     PT LONG TERM GOAL #4   Title Pt will demo Rt. LE strength (hip abd,  extension) to 4/5 or more for gait stability.    Baseline 3/5 to 3+/5    Status On-going     PT LONG TERM GOAL #5   Title FOTO score will improve to less than 35% impaired to dmeo functional improvement.    Status On-going     PT LONG TERM GOAL #6   Title Pt will consistenty walk 4 miles at least 4 times per week and maintain pain level (<2/10)    Status On-going               Plan - 10/09/16 1547    Clinical Impression Statement pt reported "it feels much better" after needling today. Focused exercises on engaging core to decr use of QL following.    PT Treatment/Interventions ADLs/Self Care Home Management;Dry needling;Taping;Cryotherapy;Electrical Stimulation;Moist Heat;Traction;Ultrasound;Passive range of motion;Neuromuscular re-education;Balance training;Therapeutic exercise;Therapeutic activities;Manual techniques;Functional mobility training;Patient/family education   PT Next Visit Plan  Pilates, hip abductor strength, core activation   PT Home Exercise Plan prepilates and hamstring stretch , ITB and adductor, PPT, knee to chest , step ups FW and lateral   Consulted and Agree with Plan of Care Patient      Patient will benefit from skilled therapeutic intervention in order to improve the following deficits and impairments:  Obesity, Abnormal gait, Pain, Increased fascial restricitons, Decreased strength, Decreased balance, Decreased range of motion, Impaired flexibility, Postural dysfunction  Visit Diagnosis: Difficulty in walking, not elsewhere classified  Muscle weakness (generalized)  Chronic right-sided low back pain with sciatica, sciatica laterality unspecified     Problem List Patient Active Problem List   Diagnosis Date Noted  . Osteoporosis 09/09/2016  . Back pain 07/15/2016  . Decreased hearing 07/15/2016  . Myofascial pain syndrome 10/08/2015  . Arthritis 06/20/2015  . HLD (hyperlipidemia) 06/20/2015  . Herpes zoster 04/13/2013  . Metatarsalgia  09/27/2012  . Allergic rhinitis 06/16/2011   Nia Nathaniel C. Deandra Gadson PT, DPT 10/09/16 3:49 PM   [facility] [address] [place], [place], [address] Phone: [phone]   Fax:  [phone]  Name: Denise Parker MRN: [id number] Date  of Birth: 1950-02-29

## 2016-10-14 ENCOUNTER — Ambulatory Visit: Payer: Medicare Other | Admitting: Physical Therapy

## 2016-10-14 DIAGNOSIS — R262 Difficulty in walking, not elsewhere classified: Secondary | ICD-10-CM

## 2016-10-14 DIAGNOSIS — M6281 Muscle weakness (generalized): Secondary | ICD-10-CM

## 2016-10-14 DIAGNOSIS — G8929 Other chronic pain: Secondary | ICD-10-CM

## 2016-10-14 DIAGNOSIS — M5440 Lumbago with sciatica, unspecified side: Secondary | ICD-10-CM

## 2016-10-14 NOTE — Therapy (Signed)
[place], [place], [address] Phone: [phone]   Fax:  [phone]  Physical Therapy Treatment  Patient Details  Name: Denise Parker MRN: [id number] Date of Birth: 16-Dec-1950 Referring Provider: Dr. Deborra Medina, Dr. Layne Benton   Encounter Date: 10/14/2016      PT End of Session - 10/14/16 1516    Visit Number 15   Date for PT Re-Evaluation 10/24/16   PT Start Time 1418   PT Stop Time 1511   PT Time Calculation (min) 53 min   Activity Tolerance Patient tolerated treatment well   Behavior During Therapy [facility] for tasks assessed/performed      Past Medical History:  Diagnosis Date  . HLD (hyperlipidemia)     Past Surgical History:  Procedure Laterality Date  . BREAST CYST ASPIRATION    . TONSILLECTOMY      There were no vitals filed for this visit.      Subjective Assessment - 10/14/16 1424    Subjective Back pain was really bad this weekend., could't get comfortable or do my exercises.  Its was 7/10 .  My hips are both doing well.  The only thing I've done differently is starting walking more (the past 2 week)   Currently in Pain? Yes   Pain Score 3    Pain Location Back   Pain Orientation Right;Lower   Pain Descriptors / Indicators Aching   Pain Type Chronic pain   Pain Onset More than a month ago   Pain Frequency Intermittent   Aggravating Factors  standing >10 min    Pain Relieving Factors meds, stretching              [facility] Adult PT Treatment/Exercise - 10/14/16 0001      Self-Care   Posture neutral spine   Other Self-Care Comments  90/90 positioning, benefits, stabilization and reprint of HEP, exercises      Lumbar Exercises: Supine   Ab Set 10 reps   Clam 10 reps;20 reps   Clam Limitations bi, unilateral    Bent Knee Raise Limitations alt marches with ab set   Large Ball Abdominal Isometric 10 reps   Large Ball Oblique Isometric 10 reps   Other Supine Lumbar Exercises in  decompression position 90/90 above exercises    Other Supine Lumbar Exercises overhead lift x 10 red band constant tension      Ultrasound   Ultrasound Location R.t lumbar paraspinals   biofreeze and [place] gel , included quadratus lumborum    Ultrasound Parameters 1.2 W/cm2, 10 MHz   Ultrasound Goals Pain                PT Education - 10/14/16 1515    Education provided Yes   Education Details OK for stabilization with pain. decompresion at 29/90.    Person(s) Educated Patient   Methods Explanation   Comprehension Verbalized understanding;Returned demonstration;Tactile cues required;Verbal cues required          PT Short Term Goals - 10/14/16 1518      PT SHORT TERM GOAL #1   Title Pt will be I with basic HEP for flexibilty and strength    Status Achieved     PT SHORT TERM GOAL #2   Title Pt will be able to sit for 30 min in typical chair more comfortably, min increase in back pain for meals, rest.    Status Partially Met     PT SHORT TERM GOAL #3   Title  Pt will continue walk up to 2 miles but with 25% less pain, difficulty.    Status Achieved     PT SHORT TERM GOAL #4   Title Pt will be able to get up and down stairs (<6 stairs) with only min difficulty in Rt. LE   Status Achieved           PT Long Term Goals - 10/14/16 1518      PT LONG TERM GOAL #1   Title Pt will be I with more advanced HEP for posture LE strength and core    Status On-going     PT LONG TERM GOAL #2   Title Pt will be able to walk as needed in the community without increase in pain from baseline for up to 30 min    Status Achieved     PT LONG TERM GOAL #3   Title Pt will be able to sleep with only min occasional sleep disturbance due to back pain.    Status Achieved     PT LONG TERM GOAL #4   Title Pt will demo Rt. LE strength (hip abd, extension) to 4/5 or more for gait stability.    Status Unable to assess     PT LONG TERM GOAL #5   Title FOTO score will improve to less than  35% impaired to dmeo functional improvement.    Status Unable to assess     PT LONG TERM GOAL #6   Title Pt will consistenty walk 4 miles at least 4 times per week and maintain pain level (<2/10)    Baseline 3 1/2 miles    Status On-going               Plan - 10/14/16 1516    Clinical Impression Statement no pain after session.  Kept spine in neutral and reinforced stabilization when pain is flared.  Applied UE to Rt. lumbar paraspinals with biofreeze.     PT Next Visit Plan  Pilates, hip abductor strength, core activation   PT Home Exercise Plan prepilates/ L stab 1  and hamstring stretch , ITB and adductor, PPT, knee to chest , step ups FW and lateral   Consulted and Agree with Plan of Care Patient      Patient will benefit from skilled therapeutic intervention in order to improve the following deficits and impairments:  Obesity, Abnormal gait, Pain, Increased fascial restricitons, Decreased strength, Decreased balance, Decreased range of motion, Impaired flexibility, Postural dysfunction  Visit Diagnosis: Difficulty in walking, not elsewhere classified  Muscle weakness (generalized)  Chronic right-sided low back pain with sciatica, sciatica laterality unspecified     Problem List Patient Active Problem List   Diagnosis Date Noted  . Osteoporosis 09/09/2016  . Back pain 07/15/2016  . Decreased hearing 07/15/2016  . Myofascial pain syndrome 10/08/2015  . Arthritis 06/20/2015  . HLD (hyperlipidemia) 06/20/2015  . Herpes zoster 04/13/2013  . Metatarsalgia 09/27/2012  . Allergic rhinitis 06/16/2011    PAA,JENNIFER 10/14/2016, 3:23 PM  [facility] [address] [place], [place], [address] Phone: [phone]   Fax:  [phone]  Name: Denise Parker MRN: [id number] Date of Birth: 11-Jun-1950   Raeford Razor, PT 10/14/16 3:23 PM Phone: [phone] Fax: [phone]

## 2016-10-14 NOTE — Patient Instructions (Signed)
Reissued HEP and also gave a more in-depth handout on lumbar stabilization, anatomy.

## 2016-10-15 ENCOUNTER — Ambulatory Visit: Payer: Medicare Other | Admitting: Physical Therapy

## 2016-10-15 ENCOUNTER — Encounter: Payer: Self-pay | Admitting: Physical Therapy

## 2016-10-15 DIAGNOSIS — R262 Difficulty in walking, not elsewhere classified: Secondary | ICD-10-CM | POA: Diagnosis not present

## 2016-10-15 DIAGNOSIS — M5440 Lumbago with sciatica, unspecified side: Secondary | ICD-10-CM | POA: Diagnosis not present

## 2016-10-15 DIAGNOSIS — G8929 Other chronic pain: Secondary | ICD-10-CM | POA: Diagnosis not present

## 2016-10-15 DIAGNOSIS — M6281 Muscle weakness (generalized): Secondary | ICD-10-CM | POA: Diagnosis not present

## 2016-10-15 NOTE — Therapy (Signed)
[place], [place], [address] Phone: [phone]   Fax:  [phone]  Physical Therapy Treatment  Patient Details  Name: Denise Parker MRN: [id number] Date of Birth: 07/04/1950 Referring Provider: Dr. Deborra Medina, Dr. Layne Benton   Encounter Date: 10/15/2016      PT End of Session - 10/15/16 1546    Visit Number 16   Number of Visits 18   Date for PT Re-Evaluation 10/24/16   PT Start Time 4235   PT Stop Time 1637   PT Time Calculation (min) 51 min   Activity Tolerance Patient tolerated treatment well   Behavior During Therapy [facility] for tasks assessed/performed      Past Medical History:  Diagnosis Date  . HLD (hyperlipidemia)     Past Surgical History:  Procedure Laterality Date  . BREAST CYST ASPIRATION    . TONSILLECTOMY      There were no vitals filed for this visit.      Subjective Assessment - 10/15/16 1546    Subjective Hip is not too bad, back is about a 4/10 right now. Started hurting when she sat up in bed.    Patient Stated Goals Pt would like to sleep better, more regular exercise routine (used to walk 4 miles x 4 times per week) current 2-3 miles, 2-3 per week more slowly.                          [facility] Adult PT Treatment/Exercise - 10/15/16 0001      Lumbar Exercises: Stretches   Piriformis Stretch 2 reps;30 seconds     Lumbar Exercises: Supine   Bent Knee Raise Limitations alt marches with ab set   Large Ball Abdominal Isometric 15 reps     Knee/Hip Exercises: Aerobic   Nustep 5 min L5  UE& LE     Moist Heat Therapy   Number Minutes Moist Heat 10 Minutes   Moist Heat Location Lumbar Spine     Manual Therapy   Manual therapy comments skilled palpation & monitoring while dry needling   Soft tissue mobilization R paraspinals, QL          Trigger Point Dry Needling - 10/15/16 1621    Muscles Treated Lower Body --  lumbar paraspinals R               PT Education - 10/14/16 1515    Education provided Yes   Education Details OK for stabilization with pain. decompresion at 22/90.    Person(s) Educated Patient   Methods Explanation   Comprehension Verbalized understanding;Returned demonstration;Tactile cues required;Verbal cues required          PT Short Term Goals - 10/14/16 1518      PT SHORT TERM GOAL #1   Title Pt will be I with basic HEP for flexibilty and strength    Status Achieved     PT SHORT TERM GOAL #2   Title Pt will be able to sit for 30 min in typical chair more comfortably, min increase in back pain for meals, rest.    Status Partially Met     PT SHORT TERM GOAL #3   Title Pt will continue walk up to 2 miles but with 25% less pain, difficulty.    Status Achieved     PT SHORT TERM GOAL #4   Title Pt will be able to get up and down stairs (<6 stairs) with only min difficulty  in Rt. LE   Status Achieved           PT Long Term Goals - 10/14/16 1518      PT LONG TERM GOAL #1   Title Pt will be I with more advanced HEP for posture LE strength and core    Status On-going     PT LONG TERM GOAL #2   Title Pt will be able to walk as needed in the community without increase in pain from baseline for up to 30 min    Status Achieved     PT LONG TERM GOAL #3   Title Pt will be able to sleep with only min occasional sleep disturbance due to back pain.    Status Achieved     PT LONG TERM GOAL #4   Title Pt will demo Rt. LE strength (hip abd, extension) to 4/5 or more for gait stability.    Status Unable to assess     PT LONG TERM GOAL #5   Title FOTO score will improve to less than 35% impaired to dmeo functional improvement.    Status Unable to assess     PT LONG TERM GOAL #6   Title Pt will consistenty walk 4 miles at least 4 times per week and maintain pain level (<2/10)    Baseline 3 1/2 miles    Status On-going               Plan - 10/15/16 1627    Clinical Impression  Statement Pt denied pain post session. Reported soreness with sidebend but no longer pain and "nothing" when rotating. Told pt to keep moving, use heat and do core exercies tonight to moderate soreness.    PT Treatment/Interventions ADLs/Self Care Home Management;Dry needling;Taping;Cryotherapy;Electrical Stimulation;Moist Heat;Traction;Ultrasound;Passive range of motion;Neuromuscular re-education;Balance training;Therapeutic exercise;Therapeutic activities;Manual techniques;Functional mobility training;Patient/family education   PT Next Visit Plan  Pilates, hip abductor strength, core activation   PT Home Exercise Plan prepilates/ L stab 1  and hamstring stretch , ITB and adductor, PPT, knee to chest , step ups FW and lateral   Consulted and Agree with Plan of Care Patient      Patient will benefit from skilled therapeutic intervention in order to improve the following deficits and impairments:  Obesity, Abnormal gait, Pain, Increased fascial restricitons, Decreased strength, Decreased balance, Decreased range of motion, Impaired flexibility, Postural dysfunction  Visit Diagnosis: Difficulty in walking, not elsewhere classified  Muscle weakness (generalized)  Chronic right-sided low back pain with sciatica, sciatica laterality unspecified     Problem List Patient Active Problem List   Diagnosis Date Noted  . Osteoporosis 09/09/2016  . Back pain 07/15/2016  . Decreased hearing 07/15/2016  . Myofascial pain syndrome 10/08/2015  . Arthritis 06/20/2015  . HLD (hyperlipidemia) 06/20/2015  . Herpes zoster 04/13/2013  . Metatarsalgia 09/27/2012  . Allergic rhinitis 06/16/2011   Thaddaeus Granja C. Kannon Granderson PT, DPT 10/15/16 4:30 PM   [facility] [address] [place], [place], [address] Phone: [phone]   Fax:  [phone]  Name: Denise Parker MRN: [id number] Date of Birth: 09/29/1950

## 2016-10-20 ENCOUNTER — Ambulatory Visit: Payer: Medicare Other | Admitting: Physical Therapy

## 2016-10-20 DIAGNOSIS — M6281 Muscle weakness (generalized): Secondary | ICD-10-CM

## 2016-10-20 DIAGNOSIS — M5440 Lumbago with sciatica, unspecified side: Secondary | ICD-10-CM

## 2016-10-20 DIAGNOSIS — G8929 Other chronic pain: Secondary | ICD-10-CM | POA: Diagnosis not present

## 2016-10-20 DIAGNOSIS — R262 Difficulty in walking, not elsewhere classified: Secondary | ICD-10-CM

## 2016-10-20 NOTE — Therapy (Signed)
[name], [place], [address] Phone: [phone]   Fax:  [phone]  Physical Therapy Treatment  Patient Details  Name: Denise Parker MRN: [id number] Date of Birth: May 22, 1950 Referring Provider: Dr. Deborra Medina, Dr. Layne Benton   Encounter Date: 10/20/2016      PT End of Session - 10/20/16 1003    Visit Number 17   Number of Visits 18   Date for PT Re-Evaluation 10/24/16   PT Start Time 0938   PT Stop Time 1017   PT Time Calculation (min) 39 min   Activity Tolerance Patient tolerated treatment well   Behavior During Therapy [facility] for tasks assessed/performed      Past Medical History:  Diagnosis Date  . HLD (hyperlipidemia)     Past Surgical History:  Procedure Laterality Date  . BREAST CYST ASPIRATION    . TONSILLECTOMY      There were no vitals filed for this visit.      Subjective Assessment - 10/20/16 0938    Subjective Rt. hip flaring up .  Back is OK, has not walked since Thursday and I'm taking alot of Ibuprofen.  Knee did something weird Thursday and I should not have walked.     Currently in Pain? Yes   Pain Score 2    Pain Location Back   Pain Orientation Right;Lower   Pain Descriptors / Indicators Aching   Pain Type Chronic pain   Pain Onset More than a month ago   Pain Frequency Intermittent   Pain Score 3  2.5/10   Pain Location Hip   Pain Orientation Right   Pain Descriptors / Indicators Sore   Pain Type Chronic pain   Pain Onset More than a month ago   Pain Frequency Intermittent   Aggravating Factors  laying on it    Pain Relieving Factors stretching , changing positions              [facility] Adult PT Treatment/Exercise - 10/20/16 0001      Lumbar Exercises: Stretches   Active Hamstring Stretch 2 reps;30 seconds   Single Knee to Chest Stretch 2 reps;30 seconds   Lower Trunk Rotation 10 seconds   Lower Trunk Rotation Limitations x 10 on ball    Piriformis  Stretch 2 reps;30 seconds     Lumbar Exercises: Supine   Bridge 10 reps   Bridge Limitations 2 sets with ball      Knee/Hip Exercises: Aerobic   Nustep 5 min L5  UE& LE     Knee/Hip Exercises: Standing   SLS with Vectors adduction, abduction, extension red band x 10 and 1 UE support    Other Standing Knee Exercises lateral band walks blue 4 x 10 feet      Knee/Hip Exercises: Seated   Marching Limitations blue band x 20 each    Sit to Sand 10 reps;without UE support  blue band                 PT Education - 10/20/16 1307    Education provided Yes   Education Details hip abd/ER activation with bands in standing    Person(s) Educated Patient   Methods Explanation   Comprehension Verbalized understanding          PT Short Term Goals - 10/20/16 1309      PT SHORT TERM GOAL #1   Title Pt will be I with basic HEP for flexibilty and strength    Status Achieved  PT SHORT TERM GOAL #2   Title Pt will be able to sit for 30 min in typical chair more comfortably, min increase in back pain for meals, rest.    Baseline >30 min back pain increases    Status Partially Met     PT SHORT TERM GOAL #3   Title Pt will continue walk up to 2 miles but with 25% less pain, difficulty.    Status Achieved     PT SHORT TERM GOAL #4   Title Pt will be able to get up and down stairs (<6 stairs) with only min difficulty in Rt. LE   Status Achieved           PT Long Term Goals - 10/14/16 1518      PT LONG TERM GOAL #1   Title Pt will be I with more advanced HEP for posture LE strength and core    Status On-going     PT LONG TERM GOAL #2   Title Pt will be able to walk as needed in the community without increase in pain from baseline for up to 30 min    Status Achieved     PT LONG TERM GOAL #3   Title Pt will be able to sleep with only min occasional sleep disturbance due to back pain.    Status Achieved     PT LONG TERM GOAL #4   Title Pt will demo Rt. LE strength  (hip abd, extension) to 4/5 or more for gait stability.    Status Unable to assess     PT LONG TERM GOAL #5   Title FOTO score will improve to less than 35% impaired to dmeo functional improvement.    Status Unable to assess     PT LONG TERM GOAL #6   Title Pt will consistenty walk 4 miles at least 4 times per week and maintain pain level (<2/10)    Baseline 3 1/2 miles    Status On-going               Plan - 10/20/16 1004    Clinical Impression Statement No increase in back pain today, controlled with meds.  Advanced to standing hip exercises.  Ready for DC next visit. Has purchased a bolster.    PT Next Visit Plan  Pilates, hip abductor strength, core activation, finalize HEP and FOTO and DC    PT Home Exercise Plan prepilates/ L stab 1  and hamstring stretch , ITB and adductor, PPT, knee to chest , step ups FW and lateral   Consulted and Agree with Plan of Care Patient      Patient will benefit from skilled therapeutic intervention in order to improve the following deficits and impairments:  Obesity, Abnormal gait, Pain, Increased fascial restricitons, Decreased strength, Decreased balance, Decreased range of motion, Impaired flexibility, Postural dysfunction  Visit Diagnosis: Difficulty in walking, not elsewhere classified  Muscle weakness (generalized)  Chronic right-sided low back pain with sciatica, sciatica laterality unspecified     Problem List Patient Active Problem List   Diagnosis Date Noted  . Osteoporosis 09/09/2016  . Back pain 07/15/2016  . Decreased hearing 07/15/2016  . Myofascial pain syndrome 10/08/2015  . Arthritis 06/20/2015  . HLD (hyperlipidemia) 06/20/2015  . Herpes zoster 04/13/2013  . Metatarsalgia 09/27/2012  . Allergic rhinitis 06/16/2011    Gwenlyn Hottinger 10/20/2016, 1:10 PM  [place], [place], [address] Phone: [phone]   Fax:  [phone]  Name:  Denise Parker MRN: [id number] Date of Birth: [birth year]   Raeford Razor, PT 10/20/16 1:11 PM Phone: [phone] Fax: [phone]

## 2016-10-21 ENCOUNTER — Ambulatory Visit (INDEPENDENT_AMBULATORY_CARE_PROVIDER_SITE_OTHER): Payer: Medicare Other | Admitting: Family Medicine

## 2016-10-21 ENCOUNTER — Telehealth: Payer: Self-pay

## 2016-10-21 ENCOUNTER — Encounter: Payer: Self-pay | Admitting: Family Medicine

## 2016-10-21 VITALS — BP 118/78 | HR 72 | Ht 64.0 in | Wt 209.0 lb

## 2016-10-21 DIAGNOSIS — M545 Low back pain: Secondary | ICD-10-CM | POA: Diagnosis not present

## 2016-10-21 DIAGNOSIS — M7061 Trochanteric bursitis, right hip: Secondary | ICD-10-CM

## 2016-10-21 MED ORDER — ALENDRONATE SODIUM 70 MG PO TABS: 70.0000 mg | ORAL_TABLET | ORAL | 11 refills | Status: DC

## 2016-10-21 NOTE — Patient Instructions (Signed)
Good to see you. Start Baclofen as it was prescribed to you by Dr. Cleophas Dunker.  Call Dr. Cleophas Dunker today to schedule a hip injection.

## 2016-10-21 NOTE — Progress Notes (Signed)
Subjective:   Patient ID: Denise Parker, female    DOB: 1950-10-29, 66 y.o.   MRN: [id number]  Ksenia Kunz is a pleasant 66 y.o. year old female who presents to clinic today with Acute Visit (back pain(doing P.T.))  on 10/21/2016  HPI:  Very tearful and frustrated.  Has been seeing Dr. Cleophas Dunker and being treated for right hip bursitis. Last saw her in 07/2016 and was given an oral course of prednisone which did not provide much relief.  Also given baclofen (she has not been taking this) and sent to PT. She feels PT has helped some with her right hip but now her right lower back and right leg are so painful that she feels it is worsening her quality of life.  Current Outpatient Prescriptions on File Prior to Visit  Medication Sig Dispense Refill  . atorvastatin (LIPITOR) 20 MG tablet TAKE 1 TABLET BY MOUTH EVERY DAY 90 tablet 0  . atorvastatin (LIPITOR) 20 MG tablet TAKE 1 TABLET BY MOUTH DAILY 90 tablet 0  . baclofen (LIORESAL) 10 MG tablet Take 10 mg by mouth 3 (three) times daily.    Marland Kitchen CALCIUM-VITAMIN D PO Take 600 mg of calcium and 800 units of vitamin D daily    . cetirizine (ZYRTEC) 5 MG tablet Take 2 tablets (10 mg total) by mouth daily. 30 tablet 6  . CRANBERRY-VITAMIN C PO Take by mouth daily.    . Flaxseed, Linseed, (FLAXSEED OIL PO) Take 1,000 mg by mouth daily.     . fluticasone (FLONASE) 50 MCG/ACT nasal spray Place 1 spray into both nostrils daily. 16 g 6  . GLUCOSAMINE CHONDROITIN COMPLX PO Take by mouth.    Marland Kitchen ibuprofen (ADVIL,MOTRIN) 200 MG tablet Take 400 mg by mouth every 6 (six) hours as needed.    . Turmeric 500 MG TABS Take 500 mg by mouth.    . vitamin C (ASCORBIC ACID) 250 MG tablet Take 250 mg by mouth daily.    . vitamin E 400 UNIT capsule Take 400 Units by mouth daily.     No current facility-administered medications on file prior to visit.     No Known Allergies  Past Medical History:  Diagnosis Date  . HLD (hyperlipidemia)     Past  Surgical History:  Procedure Laterality Date  . BREAST CYST ASPIRATION    . TONSILLECTOMY      Family History  Problem Relation Age of Onset  . Heart disease Mother   . Heart disease Father   . Breast cancer Sister 15    Social History   Social History  . Marital status: Married    Spouse name: N/A  . Number of children: N/A  . Years of education: N/A   Occupational History  . Not on file.   Social History Main Topics  . Smoking status: Never Smoker  . Smokeless tobacco: Never Used  . Alcohol use 1.8 oz/week    3 Glasses of wine per week     Comment: Wine-regular  . Drug use: No  . Sexual activity: Yes   Other Topics Concern  . Not on file   Social History Narrative   Commuting from [place] to [place] every two weeks.   Speech therapist, works with children.   Has one 70 yo son in [place] and 4 grandchildren.   Does not have a living will or HPOA   Desires CPR, would not want prolonged life support if futile.      The  PMH, PSH, Social History, Family History, Medications, and allergies have been reviewed in [facility], and have been updated if relevant.   Review of Systems  Musculoskeletal: Positive for arthralgias, back pain and gait problem.  All other systems reviewed and are negative.      Objective:    BP 118/78   Pulse 72   Ht 5\' 4"  (1.626 m)   Wt 209 lb (94.8 kg)   SpO2 98%   BMI 35.87 kg/m    Physical Exam  Constitutional: She is oriented to person, place, and time. She appears well-developed and well-nourished. No distress.  HENT:  Head: Normocephalic and atraumatic.  Eyes: Conjunctivae are normal.  Cardiovascular: Normal rate.   Pulmonary/Chest: Effort normal.  Musculoskeletal:       Lumbar back: She exhibits pain and spasm. She exhibits normal range of motion, no tenderness, no bony tenderness, no swelling, no edema and no deformity.  +SLR right  Neurological: She is alert and oriented to person, place, and time. No cranial nerve deficit.  Skin:  Skin is warm and dry. She is not diaphoretic.  Psychiatric: She has a normal mood and affect. Her behavior is normal. Judgment and thought content normal.  Nursing note and vitals reviewed.         Assessment & Plan:   Low back pain, unspecified back pain laterality, unspecified chronicity, with sciatica presence unspecified  Trochanteric bursitis, right hip No Follow-up on file.

## 2016-10-21 NOTE — Assessment & Plan Note (Signed)
>  25 minutes spent in face to face time with patient, >50% spent in counselling or coordination of care Now with right back spasm as well, likely aggravated by PT. Advised starting Baclofen- two to three times daily, call Dr. Cleophas Dunker to schedule trochanteric bursa injection.

## 2016-10-21 NOTE — Telephone Encounter (Signed)
Pt has decided wants alendronate to [facility] [address] rather than [facility]. Pt will call [facility] and have rx tranferred. Nothing further needed.

## 2016-10-22 ENCOUNTER — Encounter: Payer: Self-pay | Admitting: Physical Therapy

## 2016-10-22 ENCOUNTER — Ambulatory Visit: Payer: Medicare Other | Admitting: Physical Therapy

## 2016-10-22 DIAGNOSIS — M5440 Lumbago with sciatica, unspecified side: Secondary | ICD-10-CM | POA: Diagnosis not present

## 2016-10-22 DIAGNOSIS — M6281 Muscle weakness (generalized): Secondary | ICD-10-CM | POA: Diagnosis not present

## 2016-10-22 DIAGNOSIS — G8929 Other chronic pain: Secondary | ICD-10-CM

## 2016-10-22 DIAGNOSIS — R262 Difficulty in walking, not elsewhere classified: Secondary | ICD-10-CM | POA: Diagnosis not present

## 2016-10-22 NOTE — Therapy (Signed)
[place], [place], [address] Phone: [phone]   Fax:  [phone]  Physical Therapy Treatment/Discharge Summary  Patient Details  Name: Denise Parker MRN: [id number] Date of Birth: 06-26-1950 Referring Provider: Dr. Deborra Medina, Dr Layne Benton  Encounter Date: 10/22/2016      PT End of Session - 10/22/16 1127    Visit Number 18   Number of Visits 18   Date for PT Re-Evaluation 10/24/16   PT Start Time 1129   PT Stop Time 1157   PT Time Calculation (min) 28 min   Activity Tolerance Patient tolerated treatment well   Behavior During Therapy [facility] for tasks assessed/performed      Past Medical History:  Diagnosis Date  . HLD (hyperlipidemia)     Past Surgical History:  Procedure Laterality Date  . BREAST CYST ASPIRATION    . TONSILLECTOMY      There were no vitals filed for this visit.      Subjective Assessment - 10/22/16 1129    Subjective Pt reports hip feels good. Back is about a 2/10. Back and hip feel better than they have in about a week. Pt reports she is now on a schedule for taking ibuprofen and muscle relaxer.    Patient Stated Goals Pt would like to sleep better, more regular exercise routine (used to walk 4 miles x 4 times per week) current 2-3 miles, 2-3 per week more slowly.             [facility] PT Assessment - 10/22/16 0001      Assessment   Medical Diagnosis lumbar   Referring Provider Dr. Deborra Medina, Dr Layne Benton     Observation/Other Assessments   Focus on Therapeutic Outcomes (FOTO)  43% limitation     Strength   Right Hip Flexion 4+/5   Right Hip ABduction 4+/5                     [facility] Adult PT Treatment/Exercise - 10/22/16 0001      Exercises   Exercises Other Exercises   Other Exercises  reviewed abdominal engagment in seated and supine, reviewed supine marching & knee fall outs                PT Education - 10/22/16 1205    Education provided  Yes   Education Details importance of abdominal engagement for spinal/pelvic stability, MMT, goals discussion   Person(s) Educated Patient   Methods Explanation;Demonstration;Tactile cues;Verbal cues   Comprehension Verbalized understanding;Returned demonstration;Verbal cues required;Tactile cues required          PT Short Term Goals - 10/20/16 1309      PT SHORT TERM GOAL #1   Title Pt will be I with basic HEP for flexibilty and strength    Status Achieved     PT SHORT TERM GOAL #2   Title Pt will be able to sit for 30 min in typical chair more comfortably, min increase in back pain for meals, rest.    Baseline >30 min back pain increases    Status Partially Met     PT SHORT TERM GOAL #3   Title Pt will continue walk up to 2 miles but with 25% less pain, difficulty.    Status Achieved     PT SHORT TERM GOAL #4   Title Pt will be able to get up and down stairs (<6 stairs) with only min difficulty in Rt. LE   Status Achieved  PT Long Term Goals - 11/09/2016 1140      PT LONG TERM GOAL #1   Title Pt will be I with more advanced HEP for posture LE strength and core    Baseline reports exercises are good, do them every morning   Status Achieved     PT LONG TERM GOAL #2   Title Pt will be able to walk as needed in the community without increase in pain from baseline for up to 30 min    Status Achieved     PT LONG TERM GOAL #3   Title Pt will be able to sleep with only min occasional sleep disturbance due to back pain.    Status Achieved     PT LONG TERM GOAL #4   Title Pt will demo Rt. LE strength (hip abd, extension) to 4/5 or more for gait stability.    Baseline both 4+/5   Status Achieved     PT LONG TERM GOAL #5   Title FOTO score will improve to less than 35% impaired to dmeo functional improvement.    Baseline 43% limitation (48% limitation at eval)   Status Not Met     PT LONG TERM GOAL #6   Title Pt will consistenty walk 4 miles at least 4 times  per week and maintain pain level (<2/10)    Baseline stopped doing walks last wed or thurs, was walking 3.5 miles every day for2 weeks before that. Was walking around dining room table and knee popped/clicked causing pain.    Status Partially Met               Plan - 11/09/16 1206    Clinical Impression Statement Pt has met most of her goals set at initial evaluation and verbalized readiness for discharge. Pt reported confort and understanding of HEP and long term care for lumbopelvic strength. Pt was encouraged to contact us with any further questions.    PT Treatment/Interventions ADLs/Self Care Home Management;Dry needling;Taping;Cryotherapy;Electrical Stimulation;Moist Heat;Traction;Ultrasound;Passive range of motion;Neuromuscular re-education;Balance training;Therapeutic exercise;Therapeutic activities;Manual techniques;Functional mobility training;Patient/family education   Consulted and Agree with Plan of Care Patient      Patient will benefit from skilled therapeutic intervention in order to improve the following deficits and impairments:  Obesity, Abnormal gait, Pain, Increased fascial restricitons, Decreased strength, Decreased balance, Decreased range of motion, Impaired flexibility, Postural dysfunction  Visit Diagnosis: Difficulty in walking, not elsewhere classified  Muscle weakness (generalized)  Chronic right-sided low back pain with sciatica, sciatica laterality unspecified       G-Codes - November 09, 2016 1204    Functional Assessment Tool Used (Outpatient Only) FOTO   Functional Limitation Mobility: Walking and moving around   Mobility: Walking and Moving Around Goal Status [phone]) At least 40 percent but less than 60 percent impaired, limited or restricted   Mobility: Walking and Moving Around Discharge Status [phone]) At least 40 percent but less than 60 percent impaired, limited or restricted      Problem List Patient Active Problem List   Diagnosis Date Noted   . Trochanteric bursitis, right hip 10/21/2016  . Osteoporosis 09/09/2016  . Back pain 07/15/2016  . Decreased hearing 07/15/2016  . Myofascial pain syndrome 10/08/2015  . Arthritis 06/20/2015  . HLD (hyperlipidemia) 06/20/2015  . Herpes zoster 04/13/2013  . Metatarsalgia 09/27/2012  . Allergic rhinitis 06/16/2011    PHYSICAL THERAPY DISCHARGE SUMMARY  Visits from Start of Care: 18  Current functional level related to goals / functional outcomes: See above  Remaining deficits: See above   Education / Equipment: Anatomy of condition, POC, HEP, exercise form/rationale  Plan: Patient agrees to discharge.  Patient goals were partially met. Patient is being discharged due to meeting the stated rehab goals.  ?????  Madelline Eshbach C. Elma Shands PT, DPT 10/22/16 12:09 PM      [place] [facility] [address] [place], [place], [address] Phone: [phone]   Fax:  [phone]  Name: Marai Teehan MRN: [id number] Date of Birth: 30-Jun-1950

## 2016-10-24 ENCOUNTER — Other Ambulatory Visit: Payer: Self-pay | Admitting: Sports Medicine

## 2016-10-24 DIAGNOSIS — M545 Low back pain, unspecified: Secondary | ICD-10-CM

## 2016-10-24 DIAGNOSIS — M47816 Spondylosis without myelopathy or radiculopathy, lumbar region: Secondary | ICD-10-CM | POA: Diagnosis not present

## 2016-10-27 ENCOUNTER — Other Ambulatory Visit: Payer: Self-pay | Admitting: Family Medicine

## 2016-11-02 ENCOUNTER — Ambulatory Visit
Admission: RE | Admit: 2016-11-02 | Discharge: 2016-11-02 | Disposition: A | Discharge status: Home / Self Care | Payer: Medicare Other | Source: Ambulatory Visit | Attending: Sports Medicine | Admitting: Sports Medicine

## 2016-11-02 DIAGNOSIS — M545 Low back pain, unspecified: Secondary | ICD-10-CM

## 2016-11-02 DIAGNOSIS — M5126 Other intervertebral disc displacement, lumbar region: Secondary | ICD-10-CM | POA: Diagnosis not present

## 2016-11-03 ENCOUNTER — Ambulatory Visit: Payer: Medicare Other | Attending: Family Medicine | Admitting: Audiology

## 2016-11-03 ENCOUNTER — Other Ambulatory Visit: Payer: Self-pay | Admitting: Family Medicine

## 2016-11-03 DIAGNOSIS — H93213 Auditory recruitment, bilateral: Secondary | ICD-10-CM | POA: Diagnosis not present

## 2016-11-03 DIAGNOSIS — H9190 Unspecified hearing loss, unspecified ear: Secondary | ICD-10-CM

## 2016-11-03 DIAGNOSIS — H918X9 Other specified hearing loss, unspecified ear: Secondary | ICD-10-CM | POA: Diagnosis not present

## 2016-11-03 DIAGNOSIS — H93299 Other abnormal auditory perceptions, unspecified ear: Secondary | ICD-10-CM | POA: Diagnosis not present

## 2016-11-03 NOTE — Procedures (Signed)
Outpatient Audiology and [facility]  [address]  Denise [place], [place] [address]  [phone]   Audiological Evaluation  Patient Name: Denise Parker  Status: Outpatient   DOB: Jun 14, 1950    Diagnosis: Decreased Hearing Loss                 MRN: [id number] Date:  11/03/2016     Referent: Denise Dun, MD  History: Isa Hitz was seen for an audiological evaluation.  Primary Concern: Has noticed hearing loss for the past year in both ears.  Difficulty following conversations especially in noisy environment, must have TV turned up really loud compared to what other people want.  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  Pain: None History of hearing problems: Y - started to notice right before I retired. Got a lot of ear infections the last few years of working - was commuting back and forth to [place].  History of ear infections:  Y - the last four years.  And remember having ear pain as a child.  History of ear surgery : N History of dizziness/vertigo: N History of balance issues:  N Tinnitus: Sometimes, light and occasional. Sound sensitivity: N History of occupational noise exposure: Y - airplane History of hypertension: N History of diabetes:  N Family history of hearing loss: N   Evaluation: Conventional pure tone audiometry from  -  with using insert earphones.  Hearing Thresholds show symmetrical hearing thresholds at  - ; 35-40 dBHL at - ; and 45 dBHL at  and 60 dBHL at . However, there is a difference between the ears at  of 40 dBHL on the left and 55dBHL on the right. The hearing loss is primarily sensorineural with a slight mixed component at  bilaterally. Reliability is good Speech reception levels (repeating words near threshold) using recorded spondee word lists:  Right ear: 40 dBHL.  Left ear:  35  dBHL Word recognition (at comfortably loud volumes) using recorded NU-6 word lists at 75 dBHL (MCL), in quiet.  Right ear: 100%.  Left ear:   100% Word recognition in minimal background noise:  +5 dBHL  Right ear: 72%                              Left ear:  68%  Tympanometry (middle ear function) shows normal middle ear volume and pressure bilaterally. Ipsilateral acoustic reflexes are present at 90dB bilaterally from  -  except on the right side  is No Response.  Right ear: Compliance is shallow oh the right side (Type As).   Left ear: Compliance is within normal limits on the left side (Type A).   CONCLUSION:     Denise Parker has a significant hearing loss bilaterally. She has a mild slowing to moderate haring loss in the low and mid range with a moderately severe hearing loss in the high frequencies that is slightly poorer on the right side. 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 This amount of hearing loss would adversely affect speech communication at normal conversational speech levels.   Word recognition is excellent in quiet at very loud conversational speech levels within 3 feet of the person speaking bilaterally. Word recognition would drop significantly, at further distance, not speaking face to face or in areas with poor  reverberation.  In minimal background noise, word recognition  drops to fair in the right ear and is poor in the left ear.      Denise Hashimotoatricia may be an excellent candidate for amplification; therefore a hearing aid evaluation is recommended. In addition, because she has had several "sinus and ear infections" in the past few years, referral to an ENT for medical clearance and evaluation is recommended. Referral to [facility] is strongly recommended because this is near [employer] [place]. The test results were discussed and she was counseled.  Amplification helps make the  signal louder and therefore often improves hearing and word recognition.  Amplification has many forms including hearing aids in one or both ears, an assistive listening device which have a microphone and speaker such as a small handheld device and/or even a surround sound system of speakers.  Amplification may be covered by some insurances, but not all.  It is important to note that hearing aids must be individually fit according to the hearing test results and the ear shape.  Audiologists and hearing aid dealers in [place] must be licensed in order to dispense hearing aids.  In a addition, a trial period is mandated by law in our state because often amplification must be tried and then evaluated in order to determine benefit.    RECOMMENDATIONS: 1.   Referral to [facility] for evaluation. 2.   A hearing aid evaluation at [facility]. 3.   Monitor hearing closely with a repeat audiological evaluation in 6 months (earlier if there is any change in hearing or ear pressure).  This appointment may be completed at [facility].  4.  Strategies that help improve hearing include: A) Face the speaker directly. Optimal is having the speakers face well - lit.  Unless amplified, being within 3-6 feet of the speaker will enhance word recognition. B) Avoid having the speaker back-lit as this will minimize the ability to use cues from lip-reading, facial expression and gestures. C)  Word recognition is poorer in background noise. For optimal word recognition, turn off the TV, radio or noisy fan when engaging in conversation. In a restaurant, try to sit away from noise sources and close to the primary speaker.  D)  Ask for topic clarification from time to time in order to remain in the conversation.  Most people don't mind repeating or clarifying a point when asked.  If needed, explain the difficulty hearing in background noise or hearing loss. 5.  Encourage using hearing protection during noisy activities  such as using a weed eater, moving the lawn, shooting, etc.    Musician's plugs, are available from [employer] for music related hearing protection because there is no distortion.  Other hearing protection, such as sponge plugs (available at pharmacies) or earmuffs (available at sporting goods stores or department stores such as [employer]) are useful for noisy activities and venues.  Alexya Mcdaris L. Kate SableWoodward, Au.D., CCC-A Doctor of Audiology 11/03/2016   cc: Denise DunAron, Talia M, MD

## 2016-11-04 ENCOUNTER — Telehealth: Payer: Self-pay | Admitting: Family Medicine

## 2016-11-04 NOTE — Telephone Encounter (Signed)
left message asking pt to call office .  referral stated pt wanted to go to [facility].  Please let pt know i faxed notes to [facility] and they will contact her to schedule appointment

## 2016-11-05 DIAGNOSIS — M7061 Trochanteric bursitis, right hip: Secondary | ICD-10-CM | POA: Diagnosis not present

## 2016-11-05 DIAGNOSIS — M47816 Spondylosis without myelopathy or radiculopathy, lumbar region: Secondary | ICD-10-CM | POA: Diagnosis not present

## 2016-11-06 DIAGNOSIS — M25551 Pain in right hip: Secondary | ICD-10-CM | POA: Diagnosis not present

## 2016-11-06 DIAGNOSIS — M47817 Spondylosis without myelopathy or radiculopathy, lumbosacral region: Secondary | ICD-10-CM | POA: Diagnosis not present

## 2016-11-06 DIAGNOSIS — M545 Low back pain: Secondary | ICD-10-CM | POA: Diagnosis not present

## 2016-11-06 DIAGNOSIS — M6281 Muscle weakness (generalized): Secondary | ICD-10-CM | POA: Diagnosis not present

## 2016-11-17 DIAGNOSIS — H7403 Tympanosclerosis, bilateral: Secondary | ICD-10-CM | POA: Diagnosis not present

## 2016-11-17 DIAGNOSIS — H906 Mixed conductive and sensorineural hearing loss, bilateral: Secondary | ICD-10-CM | POA: Diagnosis not present

## 2016-11-17 DIAGNOSIS — J301 Allergic rhinitis due to pollen: Secondary | ICD-10-CM | POA: Diagnosis not present

## 2016-11-18 DIAGNOSIS — M545 Low back pain: Secondary | ICD-10-CM | POA: Diagnosis not present

## 2016-11-18 DIAGNOSIS — M47817 Spondylosis without myelopathy or radiculopathy, lumbosacral region: Secondary | ICD-10-CM | POA: Diagnosis not present

## 2016-11-18 DIAGNOSIS — M7061 Trochanteric bursitis, right hip: Secondary | ICD-10-CM | POA: Diagnosis not present

## 2017-01-26 ENCOUNTER — Other Ambulatory Visit: Payer: Self-pay | Admitting: Family Medicine

## 2017-01-27 DIAGNOSIS — H2513 Age-related nuclear cataract, bilateral: Secondary | ICD-10-CM | POA: Diagnosis not present

## 2017-03-03 ENCOUNTER — Encounter: Payer: Self-pay | Admitting: Family Medicine

## 2017-03-03 ENCOUNTER — Ambulatory Visit (INDEPENDENT_AMBULATORY_CARE_PROVIDER_SITE_OTHER): Payer: Medicare Other | Admitting: Family Medicine

## 2017-03-03 VITALS — BP 116/66 | HR 61 | Temp 98.4°F | Ht 64.0 in | Wt 177.6 lb

## 2017-03-03 DIAGNOSIS — M810 Age-related osteoporosis without current pathological fracture: Secondary | ICD-10-CM | POA: Diagnosis not present

## 2017-03-03 DIAGNOSIS — M545 Low back pain: Secondary | ICD-10-CM | POA: Diagnosis not present

## 2017-03-03 DIAGNOSIS — Z23 Encounter for immunization: Secondary | ICD-10-CM

## 2017-03-03 DIAGNOSIS — K219 Gastro-esophageal reflux disease without esophagitis: Secondary | ICD-10-CM | POA: Insufficient documentation

## 2017-03-03 NOTE — Patient Instructions (Signed)
Great to see you. Happy New Year!  We will call you once we receive your Prolia.

## 2017-03-03 NOTE — Assessment & Plan Note (Signed)
>  25 minutes spent in face to face time with patient, >50% spent in counselling or coordination of care concerning back pain, osteoporosis, GERD. Now having GERD symptoms with fosamax. Will order prolia for her. The patient indicates understanding of these issues and agrees with the plan.

## 2017-03-03 NOTE — Progress Notes (Signed)
Subjective:   Patient ID: Denise Parker, female    DOB: [birth year], 67 y.o.   MRN: [id number]  Denise Parker is a pleasant 66 y.o. year old female who presents to clinic today with Osteoporosis (Patient currently takes Fosamax.  In November she took a dose one morning and that late afternoon had chest pains then they went away.  She burps a lot the day she takes it.  Then 1-2 weeks later this same exact thing happened.  Has not had an incident since.  Wonders if needs to change to the Prolia.); Back Pain (She is also wanting to discuss her LBP.  She was seen by Sports Med.  Gave cortisone inj which helped initially but wore off after 1 week. They increased Aleve to 1am and 2qhs and increased Baclofen to tid which helped pain to become barable.  After 2 weeks of taking the Aleve it caused SE which has had to add Prilosec and reduce the Aleve to 1qd as well as Baclofen 1qd.  He mentioned possible Gleutius Medius Tendonopathy and wants to know what would treat that. Pain keeps her up at night.); and Cataract (She has Cataract surgery scheduled and is worried that if she goes without the Aleve in preparation for that what is she supposed to do to help with the pain.)  on 03/03/2017  HPI:  Osteoporosis- started Fosamax in November but this is causing significant reflux pain.   Back pain- has been seen by sports med.  Cortisone injection only helped for a week or sore.  Currently taking Alleve daily and was as Baclofen.  Also added Prilosec to help with reflux possibly caused by Alleve.   Cataract surgery is scheduled at the end of the month.  She is concerned about her back pain returning if she holds the Alleve in preparation for surgery.   Current Outpatient Medications on File Prior to Visit  Medication Sig Dispense Refill  . alendronate (FOSAMAX) 70 MG tablet Take 1 tablet (70 mg total) by mouth every 7 (seven) days. Take with a full glass of water on an empty stomach. 12 tablet 11  .  atorvastatin (LIPITOR) 20 MG tablet TAKE 1 TABLET BY MOUTH EVERY DAY 90 tablet 0  . baclofen (LIORESAL) 10 MG tablet Take 10 mg by mouth 3 (three) times daily.    Marland Kitchen CALCIUM-VITAMIN D PO Take 600 mg of calcium and 800 units of vitamin D daily    . cetirizine (ZYRTEC) 5 MG tablet Take 2 tablets (10 mg total) by mouth daily. 30 tablet 6  . CRANBERRY-VITAMIN C PO Take by mouth daily.    . Flaxseed, Linseed, (FLAXSEED OIL PO) Take 1,000 mg by mouth daily.     . fluticasone (FLONASE) 50 MCG/ACT nasal spray Place 1 spray into both nostrils daily. 16 g 6  . GLUCOSAMINE CHONDROITIN COMPLX PO Take by mouth.    . naproxen sodium (ALEVE) 220 MG tablet Take 220 mg by mouth.    . Turmeric 500 MG TABS Take 500 mg by mouth.    . vitamin C (ASCORBIC ACID) 250 MG tablet Take 250 mg by mouth daily.    . vitamin E 400 UNIT capsule Take 400 Units by mouth daily.     No current facility-administered medications on file prior to visit.     No Known Allergies  Past Medical History:  Diagnosis Date  . HLD (hyperlipidemia)     Past Surgical History:  Procedure Laterality Date  . BREAST CYST  ASPIRATION    . TONSILLECTOMY      Family History  Problem Relation Age of Onset  . Heart disease Mother   . Heart disease Father   . Breast cancer Sister 2560    Social History   Socioeconomic History  . Marital status: Married    Spouse name: Not on file  . Number of children: Not on file  . Years of education: Not on file  . Highest education level: Not on file  Social Needs  . Financial resource strain: Not on file  . Food insecurity - worry: Not on file  . Food insecurity - inability: Not on file  . Transportation needs - medical: Not on file  . Transportation needs - non-medical: Not on file  Occupational History  . Not on file  Tobacco Use  . Smoking status: Never Smoker  . Smokeless tobacco: Never Used  Substance and Sexual Activity  . Alcohol use: Yes    Alcohol/week: 1.8 oz    Types: 3  Glasses of wine per week    Comment: Wine-regular  . Drug use: No  . Sexual activity: Yes  Other Topics Concern  . Not on file  Social History Narrative   Commuting from [place] every two weeks.   Speech therapist, works with children.   Has one 69 yo son in [place] and 4 grandchildren.   Does not have a living will or HPOA   Desires CPR, would not want prolonged life support if futile.   The PMH, PSH, Social History, Family History, Medications, and allergies have been reviewed in [employer], and have been updated if relevant.   Review of Systems  Gastrointestinal:       + reflux  Musculoskeletal: Positive for back pain.  All other systems reviewed and are negative.      Objective:    BP 116/66 (BP Location: Left Arm, Patient Position: Sitting, Cuff Size: Normal)   Pulse 61   Temp 98.4 F (36.9 C) (Oral)   Ht 5\' 4"  (1.626 m)   Wt 177 lb 9.6 oz (80.6 kg)   SpO2 98%   BMI 30.48 kg/m   Wt Readings from Last 3 Encounters:  03/03/17 177 lb 9.6 oz (80.6 kg)  10/21/16 209 lb (94.8 kg)  09/09/16 223 lb (101.2 kg)    Physical Exam   General:  Well-developed,well-nourished,in no acute distress; alert,appropriate and cooperative throughout examination Head:  normocephalic and atraumatic.   Eyes:  vision grossly intact, PERRL Ears:  R ear normal and L ear normal externally, TMs clear bilaterally Nose:  no external deformity.   Mouth:  good dentition.   Neck:  No deformities, masses, or tenderness noted. Lungs:  Normal respiratory effort, chest expands symmetrically. Lungs are clear to auscultation, no crackles or wheezes. Heart:  Normal rate and regular rhythm. S1 and S2 normal without gallop, murmur, click, rub or other extra sounds. Abdomen:  Bowel sounds positive,abdomen soft and non-tender without masses, organomegaly or hernias noted Msk:  No deformity or scoliosis noted of thoracic or lumbar spine.   Extremities:  No clubbing, cyanosis, edema, or deformity noted with  normal full range of motion of all joints.   Neurologic:  alert & oriented X3 and gait normal.   Skin:  Intact without suspicious lesions or rashes Cervical Nodes:  No lymphadenopathy noted Axillary Nodes:  No palpable lymphadenopathy Psych:  Cognition and judgment appear intact. Alert and cooperative with normal attention span and concentration. No apparent delusions, illusions, hallucinations  Assessment & Plan:   Osteoporosis, unspecified osteoporosis type, unspecified pathological fracture presence  Low back pain, unspecified back pain laterality, unspecified chronicity, with sciatica presence unspecified  Gastroesophageal reflux disease, esophagitis presence not specified No Follow-up on file.

## 2017-03-03 NOTE — Assessment & Plan Note (Signed)
Continue as needed prilosec. Hopefully will improve with d/c of fosamax.

## 2017-03-03 NOTE — Assessment & Plan Note (Signed)
Followed by Dr. Cleophas DunkerBassett and has been doing really well with exercise- has lost 40 pounds!

## 2017-03-13 DIAGNOSIS — H2512 Age-related nuclear cataract, left eye: Secondary | ICD-10-CM | POA: Diagnosis not present

## 2017-03-16 ENCOUNTER — Encounter: Payer: Self-pay | Admitting: *Deleted

## 2017-03-26 ENCOUNTER — Ambulatory Visit: Payer: Medicare Other | Admitting: Certified Registered Nurse Anesthetist

## 2017-03-26 ENCOUNTER — Encounter: Payer: Self-pay | Admitting: *Deleted

## 2017-03-26 ENCOUNTER — Ambulatory Visit
Admission: RE | Admit: 2017-03-26 | Discharge: 2017-03-26 | Disposition: A | Discharge status: Home / Self Care | Payer: Medicare Other | Source: Ambulatory Visit | Attending: Ophthalmology | Admitting: Ophthalmology

## 2017-03-26 ENCOUNTER — Other Ambulatory Visit: Payer: Self-pay

## 2017-03-26 ENCOUNTER — Encounter
Admission: RE | Disposition: A | Discharge status: Home / Self Care | Payer: Self-pay | Source: Ambulatory Visit | Attending: Ophthalmology

## 2017-03-26 DIAGNOSIS — Z79899 Other long term (current) drug therapy: Secondary | ICD-10-CM | POA: Diagnosis not present

## 2017-03-26 DIAGNOSIS — E7800 Pure hypercholesterolemia, unspecified: Secondary | ICD-10-CM | POA: Diagnosis not present

## 2017-03-26 DIAGNOSIS — H9190 Unspecified hearing loss, unspecified ear: Secondary | ICD-10-CM | POA: Insufficient documentation

## 2017-03-26 DIAGNOSIS — I10 Essential (primary) hypertension: Secondary | ICD-10-CM | POA: Diagnosis not present

## 2017-03-26 DIAGNOSIS — M1990 Unspecified osteoarthritis, unspecified site: Secondary | ICD-10-CM | POA: Insufficient documentation

## 2017-03-26 DIAGNOSIS — E119 Type 2 diabetes mellitus without complications: Secondary | ICD-10-CM | POA: Diagnosis not present

## 2017-03-26 DIAGNOSIS — H2512 Age-related nuclear cataract, left eye: Secondary | ICD-10-CM | POA: Insufficient documentation

## 2017-03-26 HISTORY — DX: Unspecified hearing loss, unspecified ear: H91.90

## 2017-03-26 HISTORY — DX: Unspecified osteoarthritis, unspecified site: M19.90

## 2017-03-26 HISTORY — PX: CATARACT EXTRACTION W/PHACO: SHX586

## 2017-03-26 SURGERY — PHACOEMULSIFICATION, CATARACT, WITH IOL INSERTION
Anesthesia: Topical | Site: Eye | Laterality: Left | Wound class: Clean

## 2017-03-26 MED ORDER — ARMC OPHTHALMIC DILATING DROPS
1.0000 "application " | OPHTHALMIC | Status: AC
  Administered 2017-03-26 (×2): 1 via OPHTHALMIC

## 2017-03-26 MED ORDER — LIDOCAINE HCL (PF) 4 % IJ SOLN
INTRAMUSCULAR | Status: AC
  Filled 2017-03-26: qty 5

## 2017-03-26 MED ORDER — SODIUM HYALURONATE 23 MG/ML IO SOLN
INTRAOCULAR | Status: DC | PRN
  Administered 2017-03-26: 0.6 mL via INTRAOCULAR

## 2017-03-26 MED ORDER — EPINEPHRINE PF 1 MG/ML IJ SOLN
INTRAMUSCULAR | Status: AC
  Filled 2017-03-26: qty 2

## 2017-03-26 MED ORDER — CARBACHOL 0.01 % IO SOLN
INTRAOCULAR | Status: DC | PRN
  Administered 2017-03-26: 0.5 mL via INTRAOCULAR

## 2017-03-26 MED ORDER — EPINEPHRINE PF 1 MG/ML IJ SOLN
INTRAOCULAR | Status: DC | PRN
  Administered 2017-03-26: 09:00:00 via OPHTHALMIC

## 2017-03-26 MED ORDER — POVIDONE-IODINE 5 % OP SOLN
OPHTHALMIC | Status: DC | PRN
  Administered 2017-03-26: 1 via OPHTHALMIC

## 2017-03-26 MED ORDER — LIDOCAINE HCL (PF) 4 % IJ SOLN
INTRAMUSCULAR | Status: DC | PRN
  Administered 2017-03-26: 4 mL via OPHTHALMIC

## 2017-03-26 MED ORDER — MOXIFLOXACIN HCL 0.5 % OP SOLN
OPHTHALMIC | Status: AC
  Filled 2017-03-26: qty 3

## 2017-03-26 MED ORDER — MOXIFLOXACIN HCL 0.5 % OP SOLN: 1.0000 [drp] | OPHTHALMIC | Status: DC | PRN

## 2017-03-26 MED ORDER — SODIUM HYALURONATE 10 MG/ML IO SOLN
INTRAOCULAR | Status: DC | PRN
  Administered 2017-03-26: 0.55 mL via INTRAOCULAR

## 2017-03-26 MED ORDER — MIDAZOLAM HCL 2 MG/2ML IJ SOLN
INTRAMUSCULAR | Status: DC | PRN
  Administered 2017-03-26 (×2): 1 mg via INTRAVENOUS

## 2017-03-26 MED ORDER — MOXIFLOXACIN HCL 0.5 % OP SOLN
OPHTHALMIC | Status: DC | PRN
  Administered 2017-03-26: 0.2 mL via OPHTHALMIC

## 2017-03-26 MED ORDER — SODIUM CHLORIDE 0.9 % IV SOLN
INTRAVENOUS | Status: DC
  Administered 2017-03-26: 08:00:00 via INTRAVENOUS

## 2017-03-26 MED ORDER — ARMC OPHTHALMIC DILATING DROPS
OPHTHALMIC | Status: AC
  Administered 2017-03-26: 09:00:00
  Filled 2017-03-26: qty 0.4

## 2017-03-26 MED ORDER — POVIDONE-IODINE 5 % OP SOLN
OPHTHALMIC | Status: AC
  Filled 2017-03-26: qty 30

## 2017-03-26 MED ORDER — SODIUM HYALURONATE 23 MG/ML IO SOLN
INTRAOCULAR | Status: AC
  Filled 2017-03-26: qty 0.6

## 2017-03-26 MED ORDER — MIDAZOLAM HCL 2 MG/2ML IJ SOLN
INTRAMUSCULAR | Status: AC
Start: 2017-03-26 — End: 2017-03-26
  Filled 2017-03-26: qty 2

## 2017-03-26 SURGICAL SUPPLY — 16 items
DISSECTOR HYDRO NUCLEUS 50X22 (MISCELLANEOUS) ×3 IMPLANT
GLOVE BIO SURGEON STRL SZ8 (GLOVE) ×3 IMPLANT
GLOVE BIOGEL M 6.5 STRL (GLOVE) ×3 IMPLANT
GLOVE SURG LX 7.5 STRW (GLOVE) ×2
GLOVE SURG LX STRL 7.5 STRW (GLOVE) ×1 IMPLANT
GOWN STRL REUS W/ TWL LRG LVL3 (GOWN DISPOSABLE) ×2 IMPLANT
GOWN STRL REUS W/TWL LRG LVL3 (GOWN DISPOSABLE) ×4
LABEL CATARACT MEDS ST (LABEL) ×3 IMPLANT
LENS IOL TECNIS ITEC 25.0 (Intraocular Lens) ×3 IMPLANT
PACK CATARACT (MISCELLANEOUS) ×3 IMPLANT
PACK CATARACT KING (MISCELLANEOUS) ×3 IMPLANT
PACK EYE AFTER SURG (MISCELLANEOUS) ×3 IMPLANT
SOL BSS BAG (MISCELLANEOUS) ×3
SOLUTION BSS BAG (MISCELLANEOUS) ×1 IMPLANT
WATER STERILE IRR 250ML POUR (IV SOLUTION) ×3 IMPLANT
WIPE NON LINTING 3.25X3.25 (MISCELLANEOUS) ×3 IMPLANT

## 2017-03-26 NOTE — Anesthesia Postprocedure Evaluation (Signed)
Anesthesia Post Note  Patient: Denise Parker  Procedure(s) Performed: CATARACT EXTRACTION PHACO AND INTRAOCULAR LENS PLACEMENT ([place]) (Left Eye)  Patient location during evaluation: Short Stay Anesthesia Type: MAC Level of consciousness: awake and alert, oriented and patient cooperative Pain management: satisfactory to patient Vital Signs Assessment: post-procedure vital signs reviewed and stable Respiratory status: spontaneous breathing and respiratory function stable Cardiovascular status: blood pressure returned to baseline Postop Assessment: no headache, no apparent nausea or vomiting and adequate PO intake Anesthetic complications: no     Last Vitals:  Vitals:   03/26/17 0734 03/26/17 0916  BP: (!) 101/56 101/68  Pulse: 73 64  Resp: 16 16  Temp: (!) 36.1 C 36.8 C  SpO2: 100% 100%    Last Pain:  Vitals:   03/26/17 0916  TempSrc: Oral                 Eben Burow

## 2017-03-26 NOTE — Op Note (Signed)
OPERATIVE NOTE  [name] [id number] 03/26/2017   PREOPERATIVE DIAGNOSIS:  Nuclear sclerotic cataract left eye.  H25.12   POSTOPERATIVE DIAGNOSIS:    Nuclear sclerotic cataract left eye.     PROCEDURE:  Phacoemusification with posterior chamber intraocular lens placement of the left eye   LENS:   Implant Name Type Inv. Item Serial No. Manufacturer Lot No. LRB No. Used  LENS IOL DIOP 25.0 - [id number] Intraocular Lens LENS IOL DIOP 25.0 [id number] AMO  Left 1       PCB00 +25.0   ULTRASOUND TIME: 0 minutes 23.1 seconds.  CDE 1.71   SURGEON:  Willey BladeBradley Khadar Monger, MD, MPH   ANESTHESIA:  Topical with tetracaine drops augmented with 1% preservative-free intracameral lidocaine.  ESTIMATED BLOOD LOSS: <1 mL   COMPLICATIONS:  None.   DESCRIPTION OF PROCEDURE:  The patient was identified in the holding room and transported to the operating room and placed in the supine position under the operating microscope.  The left eye was identified as the operative eye and it was prepped and draped in the usual sterile ophthalmic fashion.   A 1.0 millimeter clear-corneal paracentesis was made at the 5:00 position. 0.5 ml of preservative-free 1% lidocaine with epinephrine was injected into the anterior chamber.  The anterior chamber was filled with Healon 5 viscoelastic.  A 2.4 millimeter keratome was used to make a near-clear corneal incision at the 2:00 position.  A curvilinear capsulorrhexis was made with a cystotome and capsulorrhexis forceps.  Balanced salt solution was used to hydrodissect and hydrodelineate the nucleus.   Phacoemulsification was then used in stop and chop fashion to remove the lens nucleus and epinucleus.  The remaining cortex was then removed using the irrigation and aspiration handpiece. Healon was then placed into the capsular bag to distend it for lens placement.  A lens was then injected into the capsular bag.  The remaining viscoelastic was aspirated.   Wounds were  hydrated with balanced salt solution.  The anterior chamber was inflated to a physiologic pressure with balanced salt solution.  Intracameral vigamox 0.1 mL undiltued was injected into the eye and a drop placed onto the ocular surface.  No wound leaks were noted.  The patient was taken to the recovery room in stable condition without complications of anesthesia or surgery  Willey BladeBradley Mohmmad Saleeby 03/26/2017, 9:15 AM

## 2017-03-26 NOTE — Transfer of Care (Signed)
Immediate Anesthesia Transfer of Care Note  Patient: Denise Parker  Procedure(s) Performed: CATARACT EXTRACTION PHACO AND INTRAOCULAR LENS PLACEMENT ([place]) (Left Eye)  Patient Location: Short Stay  Anesthesia Type:MAC  Level of Consciousness: awake, alert , oriented and patient cooperative  Airway & Oxygen Therapy: Patient Spontanous Breathing  Post-op Assessment: Report given to RN  Post vital signs: Reviewed and stable  Last Vitals:  Vitals:   03/26/17 0734  BP: (!) 101/56  Pulse: 73  Resp: 16  Temp: (!) 36.1 C  SpO2: 100%    Last Pain:  Vitals:   03/26/17 0734  TempSrc: Oral         Complications: No apparent anesthesia complications

## 2017-03-26 NOTE — Anesthesia Post-op Follow-up Note (Signed)
Anesthesia QCDR form completed.        

## 2017-03-26 NOTE — Anesthesia Preprocedure Evaluation (Signed)
Anesthesia Evaluation  Patient identified by MRN, date of birth, ID band Patient awake    Reviewed: Allergy & Precautions, H&P , NPO status , Patient's Chart, lab work & pertinent test results, reviewed documented beta blocker date and time   Airway Mallampati: II  TM Distance: >3 FB Neck ROM: full    Dental no notable dental hx. (+) Teeth Intact   Pulmonary neg pulmonary ROS,    Pulmonary exam normal breath sounds clear to auscultation       Cardiovascular Exercise Tolerance: Good hypertension, negative cardio ROS   Rhythm:regular Rate:Normal     Neuro/Psych  Neuromuscular disease negative neurological ROS  negative psych ROS   GI/Hepatic negative GI ROS, Neg liver ROS, GERD  ,  Endo/Other  negative endocrine ROSdiabetes  Renal/GU      Musculoskeletal   Abdominal   Peds  Hematology negative hematology ROS (+)   Anesthesia Other Findings   Reproductive/Obstetrics negative OB ROS                             Anesthesia Physical Anesthesia Plan  ASA: II  Anesthesia Plan: MAC   Post-op Pain Management:    Induction:   PONV Risk Score and Plan:   Airway Management Planned:   Additional Equipment:   Intra-op Plan:   Post-operative Plan:   Informed Consent: I have reviewed the patients History and Physical, chart, labs and discussed the procedure including the risks, benefits and alternatives for the proposed anesthesia with the patient or authorized representative who has indicated his/her understanding and acceptance.     Plan Discussed with: CRNA  Anesthesia Plan Comments:         Anesthesia Quick Evaluation

## 2017-03-26 NOTE — H&P (Signed)
The History and Physical notes are on paper, have been signed, and are to be scanned.   I have examined the patient and there are no changes to the H&P.   Willey BladeBradley Zitlali Primm 03/26/2017 8:42 AM

## 2017-03-26 NOTE — Discharge Instructions (Signed)
Eye Surgery Discharge Instructions  Expect mild scratchy sensation or mild soreness. DO NOT RUB YOUR EYE!  The day of surgery:  Minimal physical activity, but bed rest is not required  No reading, computer work, or close hand work  No bending, lifting, or straining.  May watch TV  For 24 hours:  No driving, legal decisions, or alcoholic beverages  Safety precautions  Eat anything you prefer: It is better to start with liquids, then soup then solid foods.  _____ Eye patch should be worn until postoperative exam tomorrow.  ____ Solar shield eyeglasses should be worn for comfort in the sunlight/patch while sleeping  Resume all regular medications including aspirin or Coumadin if these were discontinued prior to surgery. You may shower, bathe, shave, or wash your hair. Tylenol may be taken for mild discomfort.  Call your doctor if you experience significant pain, nausea, or vomiting, fever > 101 or other signs of infection. [phone] or [phone] Specific instructions:  Follow-up Information    [place] CraneKing, Bradley Mark, MD Follow up on 03/27/2017.   Specialty:  Ophthalmology Why:  9:30 Contact information: [address] [place] [id number] [phone]

## 2017-04-10 ENCOUNTER — Telehealth: Payer: Self-pay

## 2017-04-10 NOTE — Telephone Encounter (Signed)
RB-Do you have any idea about this? Plz advise/thx dmf   Copied from CRM [id number]. Topic: Inquiry >> Apr 10, 2017 10:27 AM [name], [name] wrote: Reason for CRM:  Patient called because she is almost out of her Fosamax and per her converstion with [name], she is supposed to be starting Prolia. Patient inquired status of Prolia injection and/or if she needs to refill the Fosamax until the injection comes in. Please advise.

## 2017-04-10 NOTE — Telephone Encounter (Signed)
Prolia benefits summary have been received; secondary insurance is requiring a prior auth. RN has submitted the necessary paperwork, but awaiting the approval. Will follow-up with [employer] representative on Monday, 04/13/17 to check status of PA.

## 2017-04-13 NOTE — Telephone Encounter (Signed)
Called to check on status for prior auth. Per Ruby ColaJessica, [employer] representative, it's still pending for clinical review & once the nursing team has reached a decision, they will send a fax to the office.

## 2017-04-13 NOTE — Telephone Encounter (Signed)
TA-Awaiting approval for Prolia/do you want pt to stay on Fosamax until that is squared away? If so can I send in Rx? Plz advise/thx dmf

## 2017-04-13 NOTE — Telephone Encounter (Signed)
Yes please advise her to stay on Prolia.  Thanks!

## 2017-04-14 NOTE — Telephone Encounter (Signed)
TA-Prolia has not been approved as of yet/do you want pt to use Fosamax until that is approved or just not use the Fosamax and wait/plz advise/thx dmf

## 2017-04-15 MED ORDER — ALENDRONATE SODIUM 70 MG PO TABS: 70.0000 mg | ORAL_TABLET | ORAL | 11 refills | Status: DC

## 2017-04-15 NOTE — Telephone Encounter (Signed)
eRx refills for fosamax sent.

## 2017-04-16 NOTE — Telephone Encounter (Signed)
LMOVM stating Fosamax was sent to pharm and to continue that until approval process complete for Prolia/thx dmf

## 2017-04-30 ENCOUNTER — Telehealth: Payer: Self-pay | Admitting: Behavioral Health

## 2017-04-30 DIAGNOSIS — M810 Age-related osteoporosis without current pathological fracture: Secondary | ICD-10-CM

## 2017-04-30 NOTE — Telephone Encounter (Signed)
Received summary of benefits for Prolia; no PA required. There is an estimated out of pocket expense of $240. Patient has been made aware & voiced understanding.    [name] - Is it ok for patient to come in tomorrow to have Calcium level drawn in the lab? Also, patient would like to know when she should she stop taking the Fosamax. Her last dose was this past Monday, 04/27/17.

## 2017-04-30 NOTE — Telephone Encounter (Signed)
She can go ahead and stop taking her fosamax and yes, she can come in to have her calcium drawn tomorrow. Thank you.

## 2017-05-01 ENCOUNTER — Other Ambulatory Visit (INDEPENDENT_AMBULATORY_CARE_PROVIDER_SITE_OTHER): Payer: Medicare Other

## 2017-05-01 DIAGNOSIS — M810 Age-related osteoporosis without current pathological fracture: Secondary | ICD-10-CM | POA: Diagnosis not present

## 2017-05-01 LAB — CALCIUM: Calcium: 9.3 mg/dL (ref 8.6–10.4)

## 2017-05-01 NOTE — Telephone Encounter (Signed)
Informed patient of the provider's recommendations below. She verbalized understanding & scheduled lab visit for today at 4:00 PM. Patient did not have any further questions or concerns before call ended.

## 2017-05-01 NOTE — Addendum Note (Signed)
Addended by: Harold BarbanBYRD, RONECIA E on: 05/01/2017 12:01 PM   Modules accepted: Orders

## 2017-05-04 NOTE — Telephone Encounter (Signed)
Patient scheduled nurse visit appointment for 05/07/17 at 2:00 PM to receive first Prolia injection.

## 2017-05-07 ENCOUNTER — Ambulatory Visit (INDEPENDENT_AMBULATORY_CARE_PROVIDER_SITE_OTHER): Payer: Medicare Other | Admitting: Behavioral Health

## 2017-05-07 DIAGNOSIS — M810 Age-related osteoporosis without current pathological fracture: Secondary | ICD-10-CM

## 2017-05-07 MED ORDER — DENOSUMAB 60 MG/ML ~~LOC~~ SOLN
60.0000 mg | Freq: Once | SUBCUTANEOUS | Status: AC
  Administered 2017-05-07: 60 mg via SUBCUTANEOUS

## 2017-05-07 NOTE — Progress Notes (Signed)
Patient came in office today for first Prolia injection. SQ injection was given in the left arm. Patient tolerated it well. No signs or symptoms of a reaction prior to patient leaving the office.

## 2017-05-08 ENCOUNTER — Ambulatory Visit (INDEPENDENT_AMBULATORY_CARE_PROVIDER_SITE_OTHER): Payer: Medicare Other | Admitting: Nurse Practitioner

## 2017-05-08 ENCOUNTER — Encounter: Payer: Self-pay | Admitting: Nurse Practitioner

## 2017-05-08 VITALS — BP 118/74 | HR 64 | Temp 98.0°F | Wt 168.6 lb

## 2017-05-08 DIAGNOSIS — B029 Zoster without complications: Secondary | ICD-10-CM

## 2017-05-08 MED ORDER — VALACYCLOVIR HCL 1 G PO TABS: 1000.0000 mg | ORAL_TABLET | Freq: Three times a day (TID) | ORAL | 0 refills | Status: DC

## 2017-05-08 NOTE — Patient Instructions (Signed)

## 2017-05-08 NOTE — Progress Notes (Signed)
Subjective:  Patient ID: Denise Parker, female    DOB: 1950/09/30  Age: 67 y.o. MRN: [id number]  CC: Rash (Last evening rash appeared on sternum area, burning, raised bumps, mild pain in chest. Put on topical cream to relieve pain.)   Rash  This is a new problem. The current episode started yesterday. The affected locations include the chest. The rash is characterized by blistering, burning, pain and redness. She was exposed to nothing. Pertinent negatives include no congestion, cough, facial edema, fatigue or fever. Past treatments include anti-itch cream. The treatment provided mild relief.    Outpatient Medications Prior to Visit  Medication Sig Dispense Refill  . alendronate (FOSAMAX) 70 MG tablet Take 1 tablet (70 mg total) by mouth every 7 (seven) days. Take with a full glass of water on an empty stomach. 12 tablet 11  . atorvastatin (LIPITOR) 20 MG tablet TAKE 1 TABLET BY MOUTH EVERY DAY 90 tablet 0  . baclofen (LIORESAL) 10 MG tablet Take 10 mg by mouth at bedtime. AS NEEDED    . CALCIUM-VITAMIN D PO Take 600 mg of calcium and 800 units of vitamin D daily    . cetirizine (ZYRTEC) 5 MG tablet Take 2 tablets (10 mg total) by mouth daily. (Patient taking differently: Take 5 mg by mouth daily. AS NEEDED) 30 tablet 6  . CRANBERRY-VITAMIN C PO Take by mouth daily.    . Flaxseed, Linseed, (FLAXSEED OIL PO) Take 1,000 mg by mouth daily.     . fluticasone (FLONASE) 50 MCG/ACT nasal spray Place 1 spray into both nostrils daily. 16 g 6  . GLUCOSAMINE CHONDROITIN COMPLX PO Take by mouth.    . naproxen sodium (ALEVE) 220 MG tablet Take 220 mg by mouth.    . Turmeric 500 MG TABS Take 500 mg by mouth.    . vitamin C (ASCORBIC ACID) 250 MG tablet Take 250 mg by mouth daily.    . vitamin E 400 UNIT capsule Take 400 Units by mouth daily.     No facility-administered medications prior to visit.     ROS See HPI  Objective:  BP 118/74 (BP Location: Left Arm, Patient Position: Sitting,  Cuff Size: Normal)   Pulse 64   Temp 98 F (36.7 C)   Wt 168 lb 9.6 oz (76.5 kg)   SpO2 98%   BMI 28.94 kg/m   BP Readings from Last 3 Encounters:  05/08/17 118/74  03/26/17 100/62  03/03/17 116/66    Wt Readings from Last 3 Encounters:  05/08/17 168 lb 9.6 oz (76.5 kg)  03/26/17 175 lb (79.4 kg)  03/03/17 177 lb 9.6 oz (80.6 kg)    Physical Exam  Constitutional: She is oriented to person, place, and time. No distress.  Cardiovascular: Normal rate.  Pulmonary/Chest: Effort normal. No respiratory distress.  Neurological: She is alert and oriented to person, place, and time.  Skin: Rash noted. Rash is vesicular. There is erythema.     Vitals reviewed.   Lab Results  Component Value Date   WBC 5.8 06/14/2015   HGB 13.1 06/14/2015   HCT 39.5 06/14/2015   PLT 240.0 06/14/2015   GLUCOSE 103 (H) 07/07/2016   CHOL 211 (H) 07/07/2016   TRIG 65.0 07/07/2016   HDL 66.00 07/07/2016   LDLDIRECT 171.8 09/27/2012   LDLCALC 132 (H) 07/07/2016   ALT 16 07/07/2016   AST 18 07/07/2016   NA 141 07/07/2016   K 4.1 07/07/2016   CL 106 07/07/2016   CREATININE 0.67  07/07/2016   BUN 12 07/07/2016   CO2 29 07/07/2016   TSH 2.32 07/07/2016    Assessment & Plan:   Denise Parker was seen today for rash.  Diagnoses and all orders for this visit:  Herpes zoster without complication -     valACYclovir (VALTREX) 1000 MG tablet; Take 1 tablet (1,000 mg total) by mouth 3 (three) times daily.   I am having Yvonne Kendall start on valACYclovir. I am also having her maintain her CALCIUM-VITAMIN D PO, CRANBERRY-VITAMIN C PO, vitamin E, vitamin C, Turmeric, (Flaxseed, Linseed, (FLAXSEED OIL PO)), GLUCOSAMINE CHONDROITIN COMPLX PO, atorvastatin, cetirizine, fluticasone, baclofen, naproxen sodium, and alendronate.  Meds ordered this encounter  Medications  . valACYclovir (VALTREX) 1000 MG tablet    Sig: Take 1 tablet (1,000 mg total) by mouth 3 (three) times daily.    Dispense:  21 tablet     Refill:  0    Order Specific Question:   Supervising Provider    Answer:   Dianne Dun [[id number]]    Follow-up: Return if symptoms worsen or fail to improve.  Alysia Penna, NP

## 2017-07-01 ENCOUNTER — Telehealth: Payer: Self-pay | Admitting: Family Medicine

## 2017-07-01 NOTE — Telephone Encounter (Signed)
I have called and spoke to patient. Patient has rescheduled her Medicare Wellness Visit for a later time on 07/08/17.

## 2017-07-01 NOTE — Telephone Encounter (Signed)
Copied from CRM [id number]. Topic: Quick Communication - See Telephone Encounter >> Jul 01, 2017  2:57 PM Rudi Coco, NT wrote: CRM for notification. See Telephone encounter for: 07/01/17.  Pt. Is needing to reschedule appt. For her medicare wellness visit 07/10/17 -07/26/17 (seen appt. Slot needs to be 45 mins. Could not find an appt. For pt. Soon. Pt. Can only do certain days which was already booked for time needed.

## 2017-07-06 DIAGNOSIS — H2511 Age-related nuclear cataract, right eye: Secondary | ICD-10-CM | POA: Diagnosis not present

## 2017-07-06 NOTE — Progress Notes (Signed)
Subjective:   Denise Parker is a 67 y.o. female who presents for Medicare Annual (Subsequent) preventive examination.  Review of Systems: No ROS.  Medicare Wellness Visit. Additional risk factors are reflected in the social history.  Cardiac Risk Factors include: advanced age (>77men, >74 women);dyslipidemia Sleep patterns: wakes once to urinate. Sleeps about 4-7 hrs.              Home Safety/Smoke Alarms: Feels safe in home. Smoke alarms in place.  Living environment; residence and Firearm Safety: Lives in 2 story home. Lives with son and his family.   Female:   Pap-06/22/15       Mammo-   utd    Dexa scan- utd       CCS- Cologuard 07/23/16-neg    Objective:     Vitals: BP 132/78 (BP Location: Left Arm, Patient Position: Sitting, Cuff Size: Normal)   Pulse (!) 59   Ht  (1.626 m)   Wt 163 lb 6.4 oz (74.1 kg)   SpO2 99%   BMI 28.05 kg/m   Body mass index is 28.05 kg/m.  Advanced Directives 07/08/2017 07/29/2016 07/07/2016 06/13/2015  Does Patient Have a Medical Advance Directive? No No No Yes  Type of Advance Directive - - Web designer;Living will  Does patient want to make changes to medical advance directive? - - - No - Patient declined  Copy of Healthcare Power of Attorney in Chart? - - - No - copy requested  Would patient like information on creating a medical advance directive? Yes (MAU/Ambulatory/Procedural Areas - Information given) No - Patient declined - -    Tobacco Social History   Tobacco Use  Smoking Status Never Smoker  Smokeless Tobacco Never Used     Counseling given: Not Answered   Clinical Intake: Pain : No/denies pain     Past Medical History:  Diagnosis Date  . Arthritis   . Bursitis    back  . HLD (hyperlipidemia)   . HOH (hard of hearing)    Past Surgical History:  Procedure Laterality Date  . BREAST CYST ASPIRATION    . CATARACT EXTRACTION W/PHACO Left 03/26/2017   Procedure: CATARACT EXTRACTION PHACO AND  INTRAOCULAR LENS PLACEMENT ([facility]);  Surgeon: [place] Crane, MD;  Location: [facility] ORS;  Service: Ophthalmology;  Laterality: Left;   fluid pack lot #   [id number]    00:23.1 AP%  7.5 CDE   1.71   . TONSILLECTOMY    . TUBAL LIGATION     Family History  Problem Relation Age of Onset  . Heart disease Mother   . Heart disease Father   . Breast cancer Sister 57   Social History   Socioeconomic History  . Marital status: Married    Spouse name: Not on file  . Number of children: Not on file  . Years of education: Not on file  . Highest education level: Not on file  Occupational History  . Not on file  Social Needs  . Financial resource strain: Not on file  . Food insecurity:    Worry: Not on file    Inability: Not on file  . Transportation needs:    Medical: Not on file    Non-medical: Not on file  Tobacco Use  . Smoking status: Never Smoker  . Smokeless tobacco: Never Used  Substance and Sexual Activity  . Alcohol use: Yes    Alcohol/week: 1.8 oz    Types: 3 Glasses of wine  per week    Comment: Wine-regular  . Drug use: No  . Sexual activity: Yes  Lifestyle  . Physical activity:    Days per week: Not on file    Minutes per session: Not on file  . Stress: Not on file  Relationships  . Social connections:    Talks on phone: Not on file    Gets together: Not on file    Attends religious service: Not on file    Active member of club or organization: Not on file    Attends meetings of clubs or organizations: Not on file    Relationship status: Not on file  Other Topics Concern  . Not on file  Social History Narrative   Commuting from [place] to [place] every two weeks.   Speech therapist, works with children.   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   Does not have a living will or HPOA   Desires CPR, would not want prolonged life support if futile.    Outpatient Encounter Medications as of 07/08/2017  Medication Sig  . atorvastatin (LIPITOR) 20 MG  tablet TAKE 1 TABLET BY MOUTH EVERY DAY  . baclofen (LIORESAL) 10 MG tablet Take 10 mg by mouth at bedtime. AS NEEDED  . CALCIUM-VITAMIN D PO Take 600 mg of calcium and 800 units of vitamin D daily  . cetirizine (ZYRTEC) 5 MG tablet Take 2 tablets (10 mg total) by mouth daily. (Patient taking differently: Take 5 mg by mouth daily. AS NEEDED)  . CRANBERRY-VITAMIN C PO Take by mouth daily.  Marland Kitchen denosumab (PROLIA) 60 MG/ML SOSY injection Inject 60 mg into the skin every 6 (six) months.  . Flaxseed, Linseed, (FLAXSEED OIL PO) Take 1,000 mg by mouth daily.   . fluticasone (FLONASE) 50 MCG/ACT nasal spray Place 1 spray into both nostrils daily.  Marland Kitchen GLUCOSAMINE CHONDROITIN COMPLX PO Take by mouth.  . naproxen sodium (ALEVE) 220 MG tablet Take 220 mg by mouth.  . Turmeric 500 MG TABS Take 1,000 mg by mouth.   . vitamin E 400 UNIT capsule Take 400 Units by mouth daily.  . vitamin C (ASCORBIC ACID) 250 MG tablet Take 250 mg by mouth daily.  . [DISCONTINUED] alendronate (FOSAMAX) 70 MG tablet Take 1 tablet (70 mg total) by mouth every 7 (seven) days. Take with a full glass of water on an empty stomach.  . [DISCONTINUED] valACYclovir (VALTREX) 1000 MG tablet Take 1 tablet (1,000 mg total) by mouth 3 (three) times daily.   No facility-administered encounter medications on file as of 07/08/2017.     Activities of Daily Living In your present state of health, do you have any difficulty performing the following activities: 07/08/2017 07/08/2017  Hearing? Y Y  Comment - wears bilat hearing aids since sept 2018  Vision? N -  Comment wearing glasses. had left eye cataract sx. sx scheduled for left 07/30/17. -  Difficulty concentrating or making decisions? N -  Walking or climbing stairs? N -  Dressing or bathing? N -  Doing errands, shopping? N -  Preparing Food and eating ? N -  Using the Toilet? N -  In the past six months, have you accidently leaked urine? N -  Do you have problems with loss of bowel  control? N -  Managing your Medications? N -  Managing your Finances? N -  Housekeeping or managing your Housekeeping? N -  Some recent data might be hidden    Patient Care Team:  Dianne Dun, MD as PCP - General (Family Medicine) Lemar Lofty, MD as Consulting Physician (Unknown Physician Specialty)    Assessment:   This is a routine wellness examination for Seline. Physical assessment deferred to PCP.  Exercise Activities and Dietary recommendations Current Exercise Habits: Home exercise routine, Type of exercise: walking, Time (Minutes): 50, Frequency (Times/Week): 3, Weekly Exercise (Minutes/Week): 150, Intensity: Mild, Exercise limited by: None identified Diet (meal preparation, eat out, water intake, caffeinated beverages, dairy products, fruits and vegetables): in general, a "healthy" diet  , well balanced   Goals    . Increase physical activity     Starting 07/07/2016, I will continue to exercise for at least 45 min 3-4 days per week.      . Weight (lb) < 146 lb (66.2 kg)       Fall Risk Fall Risk  07/08/2017 07/15/2016 07/07/2016 06/13/2015 01/24/2014  Falls in the past year? No No No No -  Number falls in past yr: - - - - 1   Depression Screen PHQ 2/9 Scores 07/08/2017 07/15/2016 07/07/2016 06/13/2015  PHQ - 2 Score 0 0 0 0     Cognitive Function Ad8 score reviewed for issues:  Issues making decisions:no  Less interest in hobbies / activities:no  Repeats questions, stories (family complaining):no  Trouble using ordinary gadgets (microwave, computer, phone):no  Forgets the month or year: no  Mismanaging finances: no  Remembering appts:no  Daily problems with thinking and/or memory:n Ad8 score is=0     MMSE - Mini Mental State Exam 07/07/2016 06/13/2015  Orientation to time 5 5  Orientation to Place 5 5  Registration 3 3  Attention/ Calculation 0 0  Recall 3 3  Language- name 2 objects 0 0  Language- repeat 1 1  Language- follow 3 step command 3 3   Language- read & follow direction 0 0  Write a sentence 0 0  Copy design 0 0  Total score 20 20        Immunization History  Administered Date(s) Administered  . Influenza,inj,Quad PF,6+ Mos 02/08/2016, 03/03/2017  . Influenza-Unspecified 12/08/2013  . Pneumococcal Conjugate-13 01/24/2014  . Pneumococcal-Unspecified 11/23/2012  . Zoster 06/16/2011    Screening Tests Health Maintenance  Topic Date Due  . MAMMOGRAM  09/02/2017  . INFLUENZA VACCINE  09/24/2017  . PNA vac Low Risk Adult (2 of 2 - PPSV23) 11/23/2017  . Fecal DNA (Cologuard)  07/24/2019  . TETANUS/TDAP  01/24/2021  . DEXA SCAN  Completed  . Hepatitis C Screening  Completed      Plan:    Please schedule your next medicare wellness visit with me in 1 yr.  Follow up with Dr.Aron as scheduled  Go to the lab today.  Continue to eat heart healthy diet (full of fruits, vegetables, whole grains, lean protein, water--limit salt, fat, and sugar intake) and increase physical activity as tolerated.  Continue doing brain stimulating activities (puzzles, reading, adult coloring books, staying active) to keep memory sharp.   Bring a copy of your living will and/or healthcare power of attorney to your next office visit.    I have personally reviewed and noted the following in the patient's chart:   . Medical and social history . Use of alcohol, tobacco or illicit drugs  . Current medications and supplements . Functional ability and status . Nutritional status . Physical activity . Advanced directives . List of other physicians . Hospitalizations, surgeries, and ER visits in previous 12 months . Vitals . Screenings  to include cognitive, depression, and falls . Referrals and appointments  In addition, I have reviewed and discussed with patient certain preventive protocols, quality metrics, and best practice recommendations. A written personalized care plan for preventive services as well as general preventive  health recommendations were provided to patient.     Avon Gully, [place]  07/08/2017

## 2017-07-07 ENCOUNTER — Telehealth: Payer: Self-pay

## 2017-07-07 DIAGNOSIS — E785 Hyperlipidemia, unspecified: Secondary | ICD-10-CM

## 2017-07-07 DIAGNOSIS — M810 Age-related osteoporosis without current pathological fracture: Secondary | ICD-10-CM

## 2017-07-07 NOTE — Telephone Encounter (Signed)
Yes please. Thank you

## 2017-07-07 NOTE — Telephone Encounter (Signed)
TA-Pt coming for AWV with Lawanna Kobus tomorrow/would you like for me to put in future order for labs? Vit-Yajaira Doffing with Osteoporosis M81.0 CMP; TSH; Lipid with HLD E78.5 Plz advise and I will create future order/thx dmf

## 2017-07-07 NOTE — Telephone Encounter (Signed)
Created future order per TA/thx dmf

## 2017-07-08 ENCOUNTER — Ambulatory Visit (INDEPENDENT_AMBULATORY_CARE_PROVIDER_SITE_OTHER): Payer: Medicare Other | Admitting: Behavioral Health

## 2017-07-08 ENCOUNTER — Encounter: Payer: Self-pay | Admitting: *Deleted

## 2017-07-08 ENCOUNTER — Encounter: Payer: Self-pay | Admitting: Behavioral Health

## 2017-07-08 ENCOUNTER — Other Ambulatory Visit: Payer: Private Health Insurance - Indemnity

## 2017-07-08 ENCOUNTER — Ambulatory Visit: Payer: Private Health Insurance - Indemnity | Admitting: Behavioral Health

## 2017-07-08 VITALS — BP 132/78 | HR 59 | Ht 64.0 in | Wt 163.4 lb

## 2017-07-08 DIAGNOSIS — Z0000 Encounter for general adult medical examination without abnormal findings: Secondary | ICD-10-CM | POA: Diagnosis not present

## 2017-07-08 NOTE — Patient Instructions (Signed)
Please schedule your next medicare wellness visit with me in 1 yr.  Follow up with Dr.Aron as scheduled  Go to the lab today.  Continue to eat heart healthy diet (full of fruits, vegetables, whole grains, lean protein, water--limit salt, fat, and sugar intake) and increase physical activity as tolerated.  Continue doing brain stimulating activities (puzzles, reading, adult coloring books, staying active) to keep memory sharp.   Bring a copy of your living will and/or healthcare power of attorney to your next office visit.   Denise Parker , Thank you for taking time to come for your Medicare Wellness Visit. I appreciate your ongoing commitment to your health goals. Please review the following plan we discussed and let me know if I can assist you in the future.   These are the goals we discussed: Goals    . Increase physical activity     Starting 07/07/2016, I will continue to exercise for at least 45 min 3-4 days per week.      . Weight (lb) < 146 lb (66.2 kg)       This is a list of the screening recommended for you and due dates:  Health Maintenance  Topic Date Due  . Mammogram  09/02/2017  . Flu Shot  09/24/2017  . Pneumonia vaccines (2 of 2 - PPSV23) 11/23/2017  . Cologuard (Stool DNA test)  07/24/2019  . Tetanus Vaccine  01/24/2021  . DEXA scan (bone density measurement)  Completed  .  Hepatitis C: One time screening is recommended by Center for Disease Control  (CDC) for  adults born from [birth year] through 1967.   Completed    Health Maintenance for Postmenopausal Women Menopause is a normal process in which your reproductive ability comes to an end. This process happens gradually over a span of months to years, usually between the ages of 74 and 18. Menopause is complete when you have missed 12 consecutive menstrual periods. It is important to talk with your health care provider about some of the most common conditions that affect postmenopausal women, such as heart disease,  cancer, and bone loss (osteoporosis). Adopting a healthy lifestyle and getting preventive care can help to promote your health and wellness. Those actions can also lower your chances of developing some of these common conditions. What should I know about menopause? During menopause, you may experience a number of symptoms, such as:  Moderate-to-severe hot flashes.  Night sweats.  Decrease in sex drive.  Mood swings.  Headaches.  Tiredness.  Irritability.  Memory problems.  Insomnia.  Choosing to treat or not to treat menopausal changes is an individual decision that you make with your health care provider. What should I know about hormone replacement therapy and supplements? Hormone therapy products are effective for treating symptoms that are associated with menopause, such as hot flashes and night sweats. Hormone replacement carries certain risks, especially as you become older. If you are thinking about using estrogen or estrogen with progestin treatments, discuss the benefits and risks with your health care provider. What should I know about heart disease and stroke? Heart disease, heart attack, and stroke become more likely as you age. This may be due, in part, to the hormonal changes that your body experiences during menopause. These can affect how your body processes dietary fats, triglycerides, and cholesterol. Heart attack and stroke are both medical emergencies. There are many things that you can do to help prevent heart disease and stroke:  Have your blood pressure checked at least  every 1-2 years. High blood pressure causes heart disease and increases the risk of stroke.  If you are 28-82 years old, ask your health care provider if you should take aspirin to prevent a heart attack or a stroke.  Do not use any tobacco products, including cigarettes, chewing tobacco, or electronic cigarettes. If you need help quitting, ask your health care provider.  It is important to  eat a healthy diet and maintain a healthy weight. ? Be sure to include plenty of vegetables, fruits, low-fat dairy products, and lean protein. ? Avoid eating foods that are high in solid fats, added sugars, or salt (sodium).  Get regular exercise. This is one of the most important things that you can do for your health. ? Try to exercise for at least 150 minutes each week. The type of exercise that you do should increase your heart rate and make you sweat. This is known as moderate-intensity exercise. ? Try to do strengthening exercises at least twice each week. Do these in addition to the moderate-intensity exercise.  Know your numbers.Ask your health care provider to check your cholesterol and your blood glucose. Continue to have your blood tested as directed by your health care provider.  What should I know about cancer screening? There are several types of cancer. Take the following steps to reduce your risk and to catch any cancer development as early as possible. Breast Cancer  Practice breast self-awareness. ? This means understanding how your breasts normally appear and feel. ? It also means doing regular breast self-exams. Let your health care provider know about any changes, no matter how small.  If you are 21 or older, have a clinician do a breast exam (clinical breast exam or CBE) every year. Depending on your age, family history, and medical history, it may be recommended that you also have a yearly breast X-ray (mammogram).  If you have a family history of breast cancer, talk with your health care provider about genetic screening.  If you are at high risk for breast cancer, talk with your health care provider about having an MRI and a mammogram every year.  Breast cancer (BRCA) gene test is recommended for women who have family members with BRCA-related cancers. Results of the assessment will determine the need for genetic counseling and BRCA1 and for BRCA2 testing.  BRCA-related cancers include these types: ? Breast. This occurs in males or females. ? Ovarian. ? Tubal. This may also be called fallopian tube cancer. ? Cancer of the abdominal or pelvic lining (peritoneal cancer). ? Prostate. ? Pancreatic.  Cervical, Uterine, and Ovarian Cancer Your health care provider may recommend that you be screened regularly for cancer of the pelvic organs. These include your ovaries, uterus, and vagina. This screening involves a pelvic exam, which includes checking for microscopic changes to the surface of your cervix (Pap test).  For women ages 21-65, health care providers may recommend a pelvic exam and a Pap test every three years. For women ages 51-65, they may recommend the Pap test and pelvic exam, combined with testing for human papilloma virus (HPV), every five years. Some types of HPV increase your risk of cervical cancer. Testing for HPV may also be done on women of any age who have unclear Pap test results.  Other health care providers may not recommend any screening for nonpregnant women who are considered low risk for pelvic cancer and have no symptoms. Ask your health care provider if a screening pelvic exam is right for  you.  If you have had past treatment for cervical cancer or a condition that could lead to cancer, you need Pap tests and screening for cancer for at least 20 years after your treatment. If Pap tests have been discontinued for you, your risk factors (such as having a new sexual partner) need to be reassessed to determine if you should start having screenings again. Some women have medical problems that increase the chance of getting cervical cancer. In these cases, your health care provider may recommend that you have screening and Pap tests more often.  If you have a family history of uterine cancer or ovarian cancer, talk with your health care provider about genetic screening.  If you have vaginal bleeding after reaching menopause, tell  your health care provider.  There are currently no reliable tests available to screen for ovarian cancer.  Lung Cancer Lung cancer screening is recommended for adults 39-80 years old who are at high risk for lung cancer because of a history of smoking. A yearly low-dose CT scan of the lungs is recommended if you:  Currently smoke.  Have a history of at least 30 pack-years of smoking and you currently smoke or have quit within the past 15 years. A pack-year is smoking an average of one pack of cigarettes per day for one year.  Yearly screening should:  Continue until it has been 15 years since you quit.  Stop if you develop a health problem that would prevent you from having lung cancer treatment.  Colorectal Cancer  This type of cancer can be detected and can often be prevented.  Routine colorectal cancer screening usually begins at age 94 and continues through age 12.  If you have risk factors for colon cancer, your health care provider may recommend that you be screened at an earlier age.  If you have a family history of colorectal cancer, talk with your health care provider about genetic screening.  Your health care provider may also recommend using home test kits to check for hidden blood in your stool.  A small camera at the end of a tube can be used to examine your colon directly (sigmoidoscopy or colonoscopy). This is done to check for the earliest forms of colorectal cancer.  Direct examination of the colon should be repeated every 5-10 years until age 54. However, if early forms of precancerous polyps or small growths are found or if you have a family history or genetic risk for colorectal cancer, you may need to be screened more often.  Skin Cancer  Check your skin from head to toe regularly.  Monitor any moles. Be sure to tell your health care provider: ? About any new moles or changes in moles, especially if there is a change in a mole's shape or color. ? If you  have a mole that is larger than the size of a pencil eraser.  If any of your family members has a history of skin cancer, especially at a young age, talk with your health care provider about genetic screening.  Always use sunscreen. Apply sunscreen liberally and repeatedly throughout the day.  Whenever you are outside, protect yourself by wearing long sleeves, pants, a wide-brimmed hat, and sunglasses.  What should I know about osteoporosis? Osteoporosis is a condition in which bone destruction happens more quickly than new bone creation. After menopause, you may be at an increased risk for osteoporosis. To help prevent osteoporosis or the bone fractures that can happen because of osteoporosis, the  following is recommended:  If you are 17-34 years old, get at least 1,000 mg of calcium and at least 600 mg of vitamin D per day.  If you are older than age 68 but younger than age 44, get at least 1,200 mg of calcium and at least 600 mg of vitamin D per day.  If you are older than age 54, get at least 1,200 mg of calcium and at least 800 mg of vitamin D per day.  Smoking and excessive alcohol intake increase the risk of osteoporosis. Eat foods that are rich in calcium and vitamin D, and do weight-bearing exercises several times each week as directed by your health care provider. What should I know about how menopause affects my mental health? Depression may occur at any age, but it is more common as you become older. Common symptoms of depression include:  Low or sad mood.  Changes in sleep patterns.  Changes in appetite or eating patterns.  Feeling an overall lack of motivation or enjoyment of activities that you previously enjoyed.  Frequent crying spells.  Talk with your health care provider if you think that you are experiencing depression. What should I know about immunizations? It is important that you get and maintain your immunizations. These include:  Tetanus, diphtheria, and  pertussis (Tdap) booster vaccine.  Influenza every year before the flu season begins.  Pneumonia vaccine.  Shingles vaccine.  Your health care provider may also recommend other immunizations. This information is not intended to replace advice given to you by your health care provider. Make sure you discuss any questions you have with your health care provider. Document Released: 04/04/2005 Document Revised: 08/31/2015 Document Reviewed: 11/14/2014 Elsevier Interactive Patient Education  2018 [employer].

## 2017-07-09 ENCOUNTER — Other Ambulatory Visit (INDEPENDENT_AMBULATORY_CARE_PROVIDER_SITE_OTHER): Payer: Medicare Other

## 2017-07-09 ENCOUNTER — Ambulatory Visit: Payer: Private Health Insurance - Indemnity

## 2017-07-09 DIAGNOSIS — M810 Age-related osteoporosis without current pathological fracture: Secondary | ICD-10-CM

## 2017-07-09 DIAGNOSIS — E785 Hyperlipidemia, unspecified: Secondary | ICD-10-CM

## 2017-07-09 LAB — COMPREHENSIVE METABOLIC PANEL
ALT: 12 U/L (ref 0–35)
AST: 12 U/L (ref 0–37)
Albumin: 4.1 g/dL (ref 3.5–5.2)
Alkaline Phosphatase: 31 U/L — ABNORMAL LOW (ref 39–117)
BILIRUBIN TOTAL: 0.5 mg/dL (ref 0.2–1.2)
BUN: 9 mg/dL (ref 6–23)
CHLORIDE: 107 meq/L (ref 96–112)
CO2: 28 meq/L (ref 19–32)
Calcium: 9.1 mg/dL (ref 8.4–10.5)
Creatinine, Ser: 0.7 mg/dL (ref 0.40–1.20)
GFR: 88.32 mL/min (ref >60.00)
GLUCOSE: 89 mg/dL (ref 70–99)
Potassium: 4.9 mEq/L (ref 3.5–5.1)
Sodium: 142 mEq/L (ref 135–145)
Total Protein: 6.6 g/dL (ref 6.0–8.3)

## 2017-07-09 LAB — LIPID PANEL
CHOL/HDL RATIO: 3
Cholesterol: 196 mg/dL (ref 0–200)
HDL: 62.1 mg/dL (ref >39.00)
LDL Cholesterol: 123 mg/dL — ABNORMAL HIGH (ref 0–99)
NONHDL: 133.42
Triglycerides: 51 mg/dL (ref 0.0–149.0)
VLDL: 10.2 mg/dL (ref 0.0–40.0)

## 2017-07-09 LAB — VITAMIN D 25 HYDROXY (VIT D DEFICIENCY, FRACTURES): VITD: 33.12 ng/mL (ref 30.00–100.00)

## 2017-07-09 LAB — TSH: TSH: 1.71 u[IU]/mL (ref 0.35–4.50)

## 2017-07-09 NOTE — Progress Notes (Signed)
I reviewed health advisor's note, was available for consultation, and agree with documentation and plan.  

## 2017-07-16 ENCOUNTER — Encounter: Payer: Private Health Insurance - Indemnity | Admitting: Family Medicine

## 2017-07-23 ENCOUNTER — Telehealth: Payer: Self-pay | Admitting: Family Medicine

## 2017-07-23 NOTE — Telephone Encounter (Signed)
Copied from CRM [id number]. Topic: Quick Communication - See Telephone Encounter >> Jul 23, 2017  9:28 AM Floria Raveling A wrote: CRM for notification. See Telephone encounter for: 07/23/17.  Pt called in and stated that she is having Cataract Surgery on 6/4 and has some questions about a few meds she is taking?    Best number -[phone]

## 2017-07-23 NOTE — Telephone Encounter (Signed)
Yes those should be okay to continue.

## 2017-07-23 NOTE — Telephone Encounter (Signed)
Pt is aware.  

## 2017-07-23 NOTE — Telephone Encounter (Signed)
Pt is having Cataract surgery 07/28/2017 and they told her to stop other meds accept atorvastatin and aleve 1 wk prior to surgery.   Pt still taking Methylcellulose, Laxative, (CITRUCEL) 500 MG TABS And Stool softener 100 mg 2 tab prn due to constipation. Pt was wondering if it is ok to still take these prior to her surgery? Please advise.

## 2017-07-28 ENCOUNTER — Other Ambulatory Visit: Payer: Self-pay

## 2017-07-28 ENCOUNTER — Ambulatory Visit (INDEPENDENT_AMBULATORY_CARE_PROVIDER_SITE_OTHER): Payer: TRICARE For Life (TFL) | Admitting: Family Medicine

## 2017-07-28 ENCOUNTER — Encounter: Payer: Self-pay | Admitting: Family Medicine

## 2017-07-28 VITALS — BP 112/76 | HR 58 | Temp 97.8°F | Ht 63.75 in | Wt 160.6 lb

## 2017-07-28 DIAGNOSIS — M810 Age-related osteoporosis without current pathological fracture: Secondary | ICD-10-CM | POA: Diagnosis not present

## 2017-07-28 DIAGNOSIS — Z800 Family history of malignant neoplasm of digestive organs: Secondary | ICD-10-CM

## 2017-07-28 DIAGNOSIS — M7061 Trochanteric bursitis, right hip: Secondary | ICD-10-CM

## 2017-07-28 DIAGNOSIS — E785 Hyperlipidemia, unspecified: Secondary | ICD-10-CM | POA: Diagnosis not present

## 2017-07-28 DIAGNOSIS — Z23 Encounter for immunization: Secondary | ICD-10-CM

## 2017-07-28 DIAGNOSIS — Z1211 Encounter for screening for malignant neoplasm of colon: Secondary | ICD-10-CM | POA: Diagnosis not present

## 2017-07-28 MED ORDER — CETIRIZINE HCL 5 MG PO TABS: 10.0000 mg | ORAL_TABLET | Freq: Every day | ORAL | 6 refills | Status: DC

## 2017-07-28 MED ORDER — FLUTICASONE PROPIONATE 50 MCG/ACT NA SUSP: 1.0000 | Freq: Every day | NASAL | 6 refills | Status: DC

## 2017-07-28 NOTE — Assessment & Plan Note (Signed)
Improved with current dose of lipitor. Normal liver function. No changes made today.

## 2017-07-28 NOTE — Progress Notes (Signed)
Subjective:   Patient ID: Denise Parker, female    DOB: 24-Mar-1950, 67 y.o.   MRN: [id number]  Denise Feinsteinatricia Ann Sortor is a pleasant 67 y.o. year old female who presents to clinic today with Follow-up (F/U with labs completed on 5.16.19, has questions about bursitis in left hip area and would like 3 moles checked); medication question (has questions concerning side effects of prolia and aleve, would like information on shingle vaccination.); and Medication Refill (would like zytec and flonase refills.)  on 07/28/2017  HPI:  [facility] Note reviewed.  Continues to lose weight - going to weight watchers. Feeling better.  The weight loss has helped with her hip bursitis.  Taking much less Alleve.   HLD- improved with Lipitor. Lab Results  Component Value Date   CHOL 196 07/09/2017   HDL 62.10 07/09/2017   LDLCALC 123 (H) 07/09/2017   LDLDIRECT 171.8 09/27/2012   TRIG 51.0 07/09/2017   CHOLHDL 3 07/09/2017   Sister was recently diagnosed with stage 4 colon CA.  She is 82.  Ms. Lequita HaltMorgan wonders if this will change her colon cancer screening recommendations. Negative cologuard 07/23/16.  Current Outpatient Medications on File Prior to Visit  Medication Sig Dispense Refill  . atorvastatin (LIPITOR) 20 MG tablet TAKE 1 TABLET BY MOUTH EVERY DAY 90 tablet 0  . baclofen (LIORESAL) 10 MG tablet Take 10 mg by mouth at bedtime as needed for muscle spasms.     . Menthol, Topical Analgesic, (BIOFREEZE EX) Apply 1 application topically daily as needed (bursitis in the hip and back pain).    . Methylcellulose, Laxative, (CITRUCEL) 500 MG TABS Take 1,000 mg by mouth 2 (two) times daily.    . Naphazoline-Glycerin-Zinc Sulf (CLEAR EYES MAXIMUM ITCHY EYE OP) Place 2 drops into both eyes daily.    . naproxen sodium (ALEVE) 220 MG tablet Take 220 mg by mouth 2 (two) times daily as needed (pain).     . CALCIUM-VITAMIN D PO Take 600 mg of calcium and 800 units of  vitamin D daily    . CRANBERRY-VITAMIN C PO Take 1 tablet by mouth daily.     Marland Kitchen. denosumab (PROLIA) 60 MG/ML SOSY injection Inject 60 mg into the skin every 6 (six) months.    . docusate sodium (COLACE) 100 MG capsule Take 200 mg by mouth daily as needed for mild constipation.    . Flaxseed, Linseed, (FLAXSEED OIL PO) Take 1,000 mg by mouth daily.     Marland Kitchen. GLUCOSAMINE CHONDROITIN COMPLX PO Take 1 tablet by mouth daily.     Marland Kitchen. omeprazole (PRILOSEC) 20 MG capsule Take 20 mg by mouth daily.    . Turmeric 500 MG TABS Take 1,000 mg by mouth.     . vitamin C (ASCORBIC ACID) 250 MG tablet Take 250 mg by mouth daily.    . vitamin E 400 UNIT capsule Take 400 Units by mouth daily.     No current facility-administered medications on file prior to visit.     Allergies  Allergen Reactions  . Probiotic Product Itching and Swelling    Can't remember the name, it was a women's probiotic supplement. It cause burning, itching and swelling of the ears    Past Medical History:  Diagnosis Date  . Arthritis   . Bursitis    back  . HLD (hyperlipidemia)   . HOH (hard of hearing)     Past Surgical History:  Procedure Laterality Date  . BREAST  CYST ASPIRATION    . CATARACT EXTRACTION W/PHACO Left 03/26/2017   Procedure: CATARACT EXTRACTION PHACO AND INTRAOCULAR LENS PLACEMENT (IOC);  Surgeon: Nevada Crane, MD;  Location: [facility] ORS;  Service: Ophthalmology;  Laterality: Left;   fluid pack lot #   [id number] H [place]    00:23.1 AP%  7.5 CDE   1.71   . TONSILLECTOMY    . TUBAL LIGATION      Family History  Problem Relation Age of Onset  . Heart disease Mother   . Heart disease Father   . Breast cancer Sister 52  . Cancer Sister 40       colon CA    Social History   Socioeconomic History  . Marital status: Married    Spouse name: Not on file  . Number of children: Not on file  . Years of education: Not on file  . Highest education level: Not on file  Occupational History  . Not on file  Social  Needs  . Financial resource strain: Not on file  . Food insecurity:    Worry: Not on file    Inability: Not on file  . Transportation needs:    Medical: Not on file    Non-medical: Not on file  Tobacco Use  . Smoking status: Never Smoker  . Smokeless tobacco: Never Used  Substance and Sexual Activity  . Alcohol use: Yes    Alcohol/week: 1.8 oz    Types: 3 Glasses of wine per week    Comment: Wine-regular  . Drug use: No  . Sexual activity: Yes  Lifestyle  . Physical activity:    Days per week: Not on file    Minutes per session: Not on file  . Stress: Not on file  Relationships  . Social connections:    Talks on phone: Not on file    Gets together: Not on file    Attends religious service: Not on file    Active member of club or organization: Not on file    Attends meetings of clubs or organizations: Not on file    Relationship status: Not on file  . Intimate partner violence:    Fear of current or ex partner: Not on file    Emotionally abused: Not on file    Physically abused: Not on file    Forced sexual activity: Not on file  Other Topics Concern  . Not on file  Social History Narrative   Commuting from [place] to [place] every two weeks.   Speech therapist, works with children.   Has one 38 yo son in [place] and 4 grandchildren.   Does not have a living will or HPOA   Desires CPR, would not want prolonged life support if futile.   The PMH, PSH, Social History, Family History, Medications, and allergies have been reviewed in [facility], and have been updated if relevant.   Review of Systems  Constitutional: Negative.   HENT: Negative.   Eyes: Negative.   Respiratory: Negative.   Cardiovascular: Negative.   Gastrointestinal: Negative.   Endocrine: Negative.   Genitourinary: Negative.   Musculoskeletal: Negative.   Skin: Negative.   Allergic/Immunologic: Negative.   Hematological: Negative.   Psychiatric/Behavioral: Negative.   All other systems reviewed and are  negative.      Objective:    BP 112/76 (BP Location: Left Arm, Patient Position: Sitting, Cuff Size: Normal)   Pulse (!) 58   Temp 97.8 F (36.6 C) (Oral)   Ht 5' 3.75" (1.619  m)   Wt 160 lb 9.6 oz (72.8 kg)   SpO2 100%   BMI 27.78 kg/m   Wt Readings from Last 3 Encounters:  07/28/17 160 lb 9.6 oz (72.8 kg)  07/08/17 163 lb 6.4 oz (74.1 kg)  05/08/17 168 lb 9.6 oz (76.5 kg)    Physical Exam   General:  Well-developed,well-nourished,in no acute distress; alert,appropriate and cooperative throughout examination Head:  normocephalic and atraumatic.   Eyes:  vision grossly intact, PERRL Ears:  R ear normal and L ear normal externally, TMs clear bilaterally Nose:  no external deformity.   Mouth:  good dentition.   Neck:  No deformities, masses, or tenderness noted. Lungs:  Normal respiratory effort, chest expands symmetrically. Lungs are clear to auscultation, no crackles or wheezes. Heart:  Normal rate and regular rhythm. S1 and S2 normal without gallop, murmur, click, rub or other extra sounds. Abdomen:  Bowel sounds positive,abdomen soft and non-tender without masses, organomegaly or hernias noted. Msk:  No deformity or scoliosis noted of thoracic or lumbar spine.   Extremities:  No clubbing, cyanosis, edema, or deformity noted with normal full range of motion of all joints.   Neurologic:  alert & oriented X3 and gait normal.   Skin:  Intact without suspicious lesions or rashes Psych:  Cognition and judgment appear intact. Alert and cooperative with normal attention span and concentration. No apparent delusions, illusions, hallucinations        Assessment & Plan:   Hyperlipidemia, unspecified hyperlipidemia type  Osteoporosis, unspecified osteoporosis type, unspecified pathological fracture presence  Need for shingles vaccine - Plan: Varicella-zoster vaccine IM  Screening for colon cancer - Plan: Ambulatory referral to Gastroenterology  Family history of colon  cancer - Plan: Ambulatory referral to Gastroenterology No follow-ups on file.

## 2017-07-28 NOTE — Assessment & Plan Note (Signed)
On prolia. Was concerned that the rash she developed shortly after receiving it was due to prolia.  Denise BeaversSaw Denise Parker who felt her rash was consistent with zoster and treated her with valtrex. Next prolia is due in 10/2017.  She will touch base with me again in August to discuss if she would like to try prolia again or another rx.

## 2017-07-28 NOTE — Assessment & Plan Note (Signed)
Improving.  Still taking Alleve once a day with food. CMET reassuring.

## 2017-07-28 NOTE — Patient Instructions (Signed)
Great to see you. We will call you with your GI appointment.   

## 2017-07-29 ENCOUNTER — Other Ambulatory Visit: Payer: Self-pay

## 2017-07-30 ENCOUNTER — Other Ambulatory Visit: Payer: Self-pay

## 2017-07-30 ENCOUNTER — Encounter
Admission: RE | Disposition: A | Discharge status: Home / Self Care | Payer: Self-pay | Source: Ambulatory Visit | Attending: Ophthalmology

## 2017-07-30 ENCOUNTER — Ambulatory Visit: Payer: Medicare Other | Admitting: Anesthesiology

## 2017-07-30 ENCOUNTER — Other Ambulatory Visit: Payer: Self-pay | Admitting: Family Medicine

## 2017-07-30 ENCOUNTER — Ambulatory Visit
Admission: RE | Admit: 2017-07-30 | Discharge: 2017-07-30 | Disposition: A | Discharge status: Home / Self Care | Payer: Medicare Other | Source: Ambulatory Visit | Attending: Ophthalmology | Admitting: Ophthalmology

## 2017-07-30 DIAGNOSIS — H2511 Age-related nuclear cataract, right eye: Secondary | ICD-10-CM | POA: Insufficient documentation

## 2017-07-30 HISTORY — PX: CATARACT EXTRACTION W/PHACO: SHX586

## 2017-07-30 SURGERY — PHACOEMULSIFICATION, CATARACT, WITH IOL INSERTION
Anesthesia: Monitor Anesthesia Care | Laterality: Right

## 2017-07-30 MED ORDER — EPINEPHRINE PF 1 MG/ML IJ SOLN
INTRAMUSCULAR | Status: AC
  Filled 2017-07-30: qty 1

## 2017-07-30 MED ORDER — LIDOCAINE HCL (PF) 4 % IJ SOLN
INTRAMUSCULAR | Status: AC
  Filled 2017-07-30: qty 5

## 2017-07-30 MED ORDER — SODIUM HYALURONATE 10 MG/ML IO SOLN
INTRAOCULAR | Status: DC | PRN
  Administered 2017-07-30: 0.55 mL via INTRAOCULAR

## 2017-07-30 MED ORDER — MOXIFLOXACIN HCL 0.5 % OP SOLN: 1.0000 [drp] | OPHTHALMIC | Status: DC | PRN

## 2017-07-30 MED ORDER — MOXIFLOXACIN HCL 0.5 % OP SOLN
OPHTHALMIC | Status: DC | PRN
  Administered 2017-07-30: 0.2 mL via OPHTHALMIC

## 2017-07-30 MED ORDER — ARMC OPHTHALMIC DILATING DROPS
1.0000 "application " | OPHTHALMIC | Status: AC
  Administered 2017-07-30 (×3): 1 via OPHTHALMIC

## 2017-07-30 MED ORDER — BSS IO SOLN
INTRAOCULAR | Status: DC | PRN
  Administered 2017-07-30: 1 via INTRAOCULAR

## 2017-07-30 MED ORDER — POVIDONE-IODINE 5 % OP SOLN
OPHTHALMIC | Status: AC
  Filled 2017-07-30: qty 30

## 2017-07-30 MED ORDER — GLYCOPYRROLATE 0.2 MG/ML IJ SOLN
INTRAMUSCULAR | Status: DC | PRN
  Administered 2017-07-30: 0.2 mg via INTRAVENOUS

## 2017-07-30 MED ORDER — MOXIFLOXACIN HCL 0.5 % OP SOLN
OPHTHALMIC | Status: AC
  Filled 2017-07-30: qty 3

## 2017-07-30 MED ORDER — MIDAZOLAM HCL 2 MG/2ML IJ SOLN
INTRAMUSCULAR | Status: DC | PRN
  Administered 2017-07-30 (×2): 1 mg via INTRAVENOUS

## 2017-07-30 MED ORDER — SODIUM HYALURONATE 23 MG/ML IO SOLN
INTRAOCULAR | Status: AC
  Filled 2017-07-30: qty 0.6

## 2017-07-30 MED ORDER — POVIDONE-IODINE 5 % OP SOLN
OPHTHALMIC | Status: DC | PRN
  Administered 2017-07-30: 1 via OPHTHALMIC

## 2017-07-30 MED ORDER — SODIUM CHLORIDE 0.9 % IV SOLN
INTRAVENOUS | Status: DC
  Administered 2017-07-30: 11:00:00 via INTRAVENOUS

## 2017-07-30 MED ORDER — LIDOCAINE HCL (PF) 4 % IJ SOLN
INTRAOCULAR | Status: DC | PRN
  Administered 2017-07-30: .1 mL via OPHTHALMIC

## 2017-07-30 MED ORDER — ARMC OPHTHALMIC DILATING DROPS
OPHTHALMIC | Status: AC
  Administered 2017-07-30: 1 via OPHTHALMIC
  Filled 2017-07-30: qty 0.4

## 2017-07-30 MED ORDER — MIDAZOLAM HCL 2 MG/2ML IJ SOLN
INTRAMUSCULAR | Status: AC
  Filled 2017-07-30: qty 2

## 2017-07-30 MED ORDER — SODIUM HYALURONATE 23 MG/ML IO SOLN
INTRAOCULAR | Status: DC | PRN
  Administered 2017-07-30: 0.6 mL via INTRAOCULAR

## 2017-07-30 SURGICAL SUPPLY — 18 items
DISSECTOR HYDRO NUCLEUS 50X22 (MISCELLANEOUS) ×3 IMPLANT
GLOVE BIO SURGEON STRL SZ8 (GLOVE) ×3 IMPLANT
GLOVE BIOGEL M 6.5 STRL (GLOVE) ×3 IMPLANT
GLOVE SURG LX 7.5 STRW (GLOVE) ×2
GLOVE SURG LX STRL 7.5 STRW (GLOVE) ×1 IMPLANT
GOWN STRL REUS W/ TWL LRG LVL3 (GOWN DISPOSABLE) ×2 IMPLANT
GOWN STRL REUS W/TWL LRG LVL3 (GOWN DISPOSABLE) ×4
LABEL CATARACT MEDS ST (LABEL) ×3 IMPLANT
LENS IOL TECNIS ITEC 25.5 (Intraocular Lens) ×3 IMPLANT
PACK CATARACT (MISCELLANEOUS) ×3 IMPLANT
PACK CATARACT KING (MISCELLANEOUS) ×3 IMPLANT
PACK EYE AFTER SURG (MISCELLANEOUS) ×3 IMPLANT
SOL BAL SALT 15ML (MISCELLANEOUS) ×3
SOL BSS BAG (MISCELLANEOUS) ×3
SOLUTION BAL SALT 15ML (MISCELLANEOUS) ×1 IMPLANT
SOLUTION BSS BAG (MISCELLANEOUS) ×1 IMPLANT
WATER STERILE IRR 250ML POUR (IV SOLUTION) ×3 IMPLANT
WIPE NON LINTING 3.25X3.25 (MISCELLANEOUS) ×3 IMPLANT

## 2017-07-30 NOTE — Anesthesia Preprocedure Evaluation (Signed)
Anesthesia Evaluation  Patient identified by MRN, date of birth, ID band Patient awake    Reviewed: Allergy & Precautions, NPO status , Patient's Chart, lab work & pertinent test results  History of Anesthesia Complications Negative for: history of anesthetic complications  Airway Mallampati: II       Dental   Pulmonary neg sleep apnea, neg COPD,           Cardiovascular (-) hypertension(-) Past MI and (-) CHF (-) dysrhythmias (-) Valvular Problems/Murmurs     Neuro/Psych neg Seizures    GI/Hepatic Neg liver ROS, GERD  Medicated and Controlled,  Endo/Other  neg diabetes  Renal/GU negative Renal ROS     Musculoskeletal   Abdominal   Peds  Hematology   Anesthesia Other Findings   Reproductive/Obstetrics                             Anesthesia Physical Anesthesia Plan  ASA: II  Anesthesia Plan: MAC   Post-op Pain Management:    Induction: Intravenous  PONV Risk Score and Plan:   Airway Management Planned: Nasal Cannula  Additional Equipment:   Intra-op Plan:   Post-operative Plan:   Informed Consent: I have reviewed the patients History and Physical, chart, labs and discussed the procedure including the risks, benefits and alternatives for the proposed anesthesia with the patient or authorized representative who has indicated his/her understanding and acceptance.     Plan Discussed with:   Anesthesia Plan Comments:         Anesthesia Quick Evaluation

## 2017-07-30 NOTE — H&P (Signed)
The History and Physical notes are on paper, have been signed, and are to be scanned.   I have examined the patient and there are no changes to the H&P.   Denise BladeBradley Tomika Eckles 07/30/2017 11:58 AM

## 2017-07-30 NOTE — Transfer of Care (Signed)
Immediate Anesthesia Transfer of Care Note  Patient: Denise Parker  Procedure(s) Performed: CATARACT EXTRACTION PHACO AND INTRAOCULAR LENS PLACEMENT (IOC) (Right )  Patient Location: Short Stay  Anesthesia Type:MAC  Level of Consciousness: awake, alert  and oriented  Airway & Oxygen Therapy: Patient Spontanous Breathing  Post-op Assessment: Report given to RN and Post -op Vital signs reviewed and stable  Post vital signs: Reviewed and stable  Last Vitals:  Vitals Value Taken Time  BP 90/72 07/30/2017 12:29 PM  Temp 35.6 C 07/30/2017 12:27 PM  Pulse 70 07/30/2017 12:29 PM  Resp 16 07/30/2017 12:29 PM  SpO2 100 % 07/30/2017 12:29 PM    Last Pain:  Vitals:   07/30/17 1227  TempSrc:   PainSc: 0-No pain         Complications: No apparent anesthesia complications

## 2017-07-30 NOTE — Anesthesia Procedure Notes (Signed)
Procedure Name: MAC Performed by: Ayriel Texidor, CRNA Pre-anesthesia Checklist: Patient identified, Emergency Drugs available, Suction available, Patient being monitored and Timeout performed Oxygen Delivery Method: Nasal cannula       

## 2017-07-30 NOTE — Anesthesia Post-op Follow-up Note (Signed)
Anesthesia QCDR form completed.        

## 2017-07-30 NOTE — Anesthesia Postprocedure Evaluation (Signed)
Anesthesia Post Note  Patient: Denise Parker  Procedure(s) Performed: CATARACT EXTRACTION PHACO AND INTRAOCULAR LENS PLACEMENT (IOC) (Right )  Patient location during evaluation: Short Stay Anesthesia Type: MAC Level of consciousness: oriented and awake and alert Pain management: pain level controlled Vital Signs Assessment: post-procedure vital signs reviewed and stable Respiratory status: spontaneous breathing Cardiovascular status: stable Postop Assessment: no headache Anesthetic complications: no     Last Vitals:  Vitals:   07/30/17 1229 07/30/17 1236  BP: 90/72 (!) 89/60  Pulse: 70 70  Resp: 16   Temp:    SpO2: 100% 100%    Last Pain:  Vitals:   07/30/17 1236  TempSrc:   PainSc: 0-No pain                 Lanora Manis

## 2017-07-30 NOTE — Discharge Instructions (Signed)
Eye Surgery Discharge Instructions  Expect mild scratchy sensation or mild soreness. DO NOT RUB YOUR EYE!  The day of surgery:  Minimal physical activity, but bed rest is not required  No reading, computer work, or close hand work  No bending, lifting, or straining.  May watch TV  For 24 hours:  No driving, legal decisions, or alcoholic beverages  Safety precautions  Eat anything you prefer: It is better to start with liquids, then soup then solid foods.  _____ Eye patch should be worn until postoperative exam tomorrow.  ____ Solar shield eyeglasses should be worn for comfort in the sunlight/patch while sleeping  Resume all regular medications including aspirin or Coumadin if these were discontinued prior to surgery. You may shower, bathe, shave, or wash your hair. Tylenol may be taken for mild discomfort.  Call your doctor if you experience significant pain, nausea, or vomiting, fever > 101 or other signs of infection. [phone] or [phone] Specific instructions:  Follow-up Information    [place] CraneKing, Bradley Mark, MD Follow up.   Specialty:  Ophthalmology Why:  07/31/17 at9:30 Contact information: [address] [place] [address] [phone]          Eye Surgery Discharge Instructions  Expect mild scratchy sensation or mild soreness. DO NOT RUB YOUR EYE!  The day of surgery:  Minimal physical activity, but bed rest is not required  No reading, computer work, or close hand work  No bending, lifting, or straining.  May watch TV  For 24 hours:  No driving, legal decisions, or alcoholic beverages  Safety precautions  Eat anything you prefer: It is better to start with liquids, then soup then solid foods.  _____ Eye patch should be worn until postoperative exam tomorrow.  ____ Solar shield eyeglasses should be worn for comfort in the sunlight/patch while sleeping  Resume all regular medications including aspirin or Coumadin if  these were discontinued prior to surgery. You may shower, bathe, shave, or wash your hair. Tylenol may be taken for mild discomfort.  Call your doctor if you experience significant pain, nausea, or vomiting, fever > 101 or other signs of infection. [phone] or [phone] Specific instructions:  Follow-up Information    [place] CraneKing, Bradley Mark, MD Follow up.   Specialty:  Ophthalmology Why:  07/31/17 at9:30 Contact information: [address] [place] [address] [phone]

## 2017-07-30 NOTE — Op Note (Signed)
OPERATIVE NOTE  [name] [id number] 07/30/2017   PREOPERATIVE DIAGNOSIS:  Nuclear sclerotic cataract right eye.  H25.11   POSTOPERATIVE DIAGNOSIS:    Nuclear sclerotic cataract right eye.     PROCEDURE:  Phacoemusification with posterior chamber intraocular lens placement of the right eye   LENS:   Implant Name Type Inv. Item Serial No. Manufacturer Lot No. LRB No. Used  LENS IOL DIOP 25.5 - [id number] Intraocular Lens LENS IOL DIOP 25.5 [id number]  Right 1       PCB00 +25.5   ULTRASOUND TIME: 0 minutes 32.1 seconds.  CDE 1.89   SURGEON:  Willey BladeBradley Jalissa Heinzelman, MD, MPH  ANESTHESIOLOGIST: Anesthesiologist: Naomie DeanKephart, William K, MD CRNA: Casey BurkittHoang, Thuy, CRNA; Omer JackWeatherly, Janice, CRNA   ANESTHESIA:  Topical with tetracaine drops augmented with 1% preservative-free intracameral lidocaine.  ESTIMATED BLOOD LOSS: less than 1 mL.   COMPLICATIONS:  None.   DESCRIPTION OF PROCEDURE:  The patient was identified in the holding room and transported to the operating room and placed in the supine position under the operating microscope.  The right eye was identified as the operative eye and it was prepped and draped in the usual sterile ophthalmic fashion.   A 1.0 millimeter clear-corneal paracentesis was made at the 10:30 position. 0.5 ml of preservative-free 1% lidocaine with epinephrine was injected into the anterior chamber.  The anterior chamber was filled with Healon 5 viscoelastic.  A 2.4 millimeter keratome was used to make a near-clear corneal incision at the 8:00 position.  A curvilinear capsulorrhexis was made with a cystotome and capsulorrhexis forceps.  Balanced salt solution was used to hydrodissect and hydrodelineate the nucleus.   Phacoemulsification was then used in stop and chop fashion to remove the lens nucleus and epinucleus.  The remaining cortex was then removed using the irrigation and aspiration handpiece. Healon was then placed into the capsular bag to distend it for  lens placement.  A lens was then injected into the capsular bag.  The remaining viscoelastic was aspirated.   Wounds were hydrated with balanced salt solution.  The anterior chamber was inflated to a physiologic pressure with balanced salt solution.   Intracameral vigamox 0.1 mL undiluted was injected into the eye and a drop placed onto the ocular surface.  No wound leaks were noted.  The patient was taken to the recovery room in stable condition without complications of anesthesia or surgery  Willey BladeBradley Kenisha Lynds 07/30/2017, 12:26 PM

## 2017-07-31 ENCOUNTER — Telehealth: Payer: Self-pay | Admitting: Gastroenterology

## 2017-07-31 ENCOUNTER — Other Ambulatory Visit: Payer: Self-pay | Admitting: Family Medicine

## 2017-07-31 ENCOUNTER — Encounter: Payer: Self-pay | Admitting: Ophthalmology

## 2017-07-31 DIAGNOSIS — Z1231 Encounter for screening mammogram for malignant neoplasm of breast: Secondary | ICD-10-CM

## 2017-07-31 NOTE — Telephone Encounter (Signed)
Patient Denise Parker to reschedule her colonoscopy until early July. Please call

## 2017-07-31 NOTE — Telephone Encounter (Signed)
Returned pts call to reschedule her colonoscopy.  She has been rescheduled to July 1st with Dr. Allegra LaiVanga (same provider).  Denise Parker in Endo has been informed.  Thanks [employer]

## 2017-08-05 ENCOUNTER — Other Ambulatory Visit: Payer: Self-pay

## 2017-08-05 ENCOUNTER — Telehealth: Payer: Self-pay

## 2017-08-05 NOTE — Telephone Encounter (Signed)
Patient has been contacted to reschedule her colonoscopy because Dr. Allegra LaiVanga will not be in on 08/24/17.  She has agreed to be moved to July 15th.  [name] in Endo has been informed.  Thanks [employer]

## 2017-08-11 ENCOUNTER — Telehealth: Payer: Self-pay | Admitting: Gastroenterology

## 2017-08-11 NOTE — Telephone Encounter (Signed)
Pt left  vm to reschedule her procedure her husband [place] be [place] to [place] her

## 2017-08-11 NOTE — Telephone Encounter (Signed)
Patients colonoscopy has been rescheduled to 07/19 with Dr. Maximino Greenlandahiliani.  [name] in Endo has been informed.  Thanks [employer]

## 2017-09-03 ENCOUNTER — Ambulatory Visit
Admission: RE | Admit: 2017-09-03 | Discharge: 2017-09-03 | Disposition: A | Discharge status: Home / Self Care | Payer: Medicare Other | Source: Ambulatory Visit | Attending: Family Medicine | Admitting: Family Medicine

## 2017-09-03 DIAGNOSIS — Z1231 Encounter for screening mammogram for malignant neoplasm of breast: Secondary | ICD-10-CM | POA: Insufficient documentation

## 2017-09-07 ENCOUNTER — Telehealth: Payer: Self-pay | Admitting: Gastroenterology

## 2017-09-07 NOTE — Telephone Encounter (Signed)
Pt left cm again to speak with nurse

## 2017-09-07 NOTE — Telephone Encounter (Signed)
Pt left vm she has procedure 7/19 and wanted to speak to a nurse about her procedure

## 2017-09-11 ENCOUNTER — Ambulatory Visit: Payer: Medicare Other | Admitting: Registered Nurse

## 2017-09-11 ENCOUNTER — Encounter
Admission: RE | Disposition: A | Discharge status: Home / Self Care | Payer: Self-pay | Source: Ambulatory Visit | Attending: Gastroenterology

## 2017-09-11 ENCOUNTER — Ambulatory Visit
Admission: RE | Admit: 2017-09-11 | Discharge: 2017-09-11 | Disposition: A | Discharge status: Home / Self Care | Payer: Medicare Other | Source: Ambulatory Visit | Attending: Gastroenterology | Admitting: Gastroenterology

## 2017-09-11 ENCOUNTER — Encounter: Payer: Self-pay | Admitting: Gastroenterology

## 2017-09-11 DIAGNOSIS — Z79899 Other long term (current) drug therapy: Secondary | ICD-10-CM | POA: Diagnosis not present

## 2017-09-11 DIAGNOSIS — K219 Gastro-esophageal reflux disease without esophagitis: Secondary | ICD-10-CM | POA: Diagnosis not present

## 2017-09-11 DIAGNOSIS — E785 Hyperlipidemia, unspecified: Secondary | ICD-10-CM | POA: Diagnosis not present

## 2017-09-11 DIAGNOSIS — M1990 Unspecified osteoarthritis, unspecified site: Secondary | ICD-10-CM | POA: Diagnosis not present

## 2017-09-11 DIAGNOSIS — Z888 Allergy status to other drugs, medicaments and biological substances status: Secondary | ICD-10-CM | POA: Insufficient documentation

## 2017-09-11 DIAGNOSIS — Z1211 Encounter for screening for malignant neoplasm of colon: Secondary | ICD-10-CM

## 2017-09-11 DIAGNOSIS — J309 Allergic rhinitis, unspecified: Secondary | ICD-10-CM | POA: Diagnosis not present

## 2017-09-11 HISTORY — PX: COLONOSCOPY WITH PROPOFOL: SHX5780

## 2017-09-11 SURGERY — COLONOSCOPY WITH PROPOFOL
Anesthesia: General

## 2017-09-11 MED ORDER — SODIUM CHLORIDE 0.9 % IV SOLN
INTRAVENOUS | Status: DC
  Administered 2017-09-11: 1000 mL via INTRAVENOUS

## 2017-09-11 MED ORDER — MIDAZOLAM HCL 2 MG/2ML IJ SOLN
INTRAMUSCULAR | Status: AC
  Filled 2017-09-11: qty 2

## 2017-09-11 MED ORDER — PROPOFOL 10 MG/ML IV BOLUS
INTRAVENOUS | Status: DC | PRN
  Administered 2017-09-11: 70 mg via INTRAVENOUS

## 2017-09-11 MED ORDER — MIDAZOLAM HCL 2 MG/2ML IJ SOLN
INTRAMUSCULAR | Status: DC | PRN
  Administered 2017-09-11: 2 mg via INTRAVENOUS

## 2017-09-11 MED ORDER — LIDOCAINE HCL (CARDIAC) PF 100 MG/5ML IV SOSY
PREFILLED_SYRINGE | INTRAVENOUS | Status: DC | PRN
  Administered 2017-09-11: 40 mg via INTRAVENOUS

## 2017-09-11 MED ORDER — LIDOCAINE HCL (PF) 1 % IJ SOLN
2.0000 mL | Freq: Once | INTRAMUSCULAR | Status: AC
  Administered 2017-09-11: 0.3 mL via INTRADERMAL

## 2017-09-11 MED ORDER — PROPOFOL 500 MG/50ML IV EMUL
INTRAVENOUS | Status: DC | PRN
  Administered 2017-09-11: 150 ug/kg/min via INTRAVENOUS

## 2017-09-11 MED ORDER — PROPOFOL 500 MG/50ML IV EMUL
INTRAVENOUS | Status: AC
  Filled 2017-09-11: qty 50

## 2017-09-11 MED ORDER — LIDOCAINE HCL (PF) 1 % IJ SOLN
INTRAMUSCULAR | Status: AC
  Administered 2017-09-11: 0.3 mL via INTRADERMAL
  Filled 2017-09-11: qty 2

## 2017-09-11 NOTE — Anesthesia Postprocedure Evaluation (Signed)
Anesthesia Post Note  Patient: Denise Parker  Procedure(s) Performed: COLONOSCOPY WITH PROPOFOL (N/A )  Patient location during evaluation: Endoscopy Anesthesia Type: General Level of consciousness: awake and alert Pain management: pain level controlled Vital Signs Assessment: post-procedure vital signs reviewed and stable Respiratory status: spontaneous breathing, nonlabored ventilation, respiratory function stable and patient connected to nasal cannula oxygen Cardiovascular status: blood pressure returned to baseline and stable Postop Assessment: no apparent nausea or vomiting Anesthetic complications: no     Last Vitals:  Vitals:   09/11/17 1015 09/11/17 1025  BP: (!) 88/49 98/63  Pulse:    Resp:    Temp:    SpO2:      Last Pain:  Vitals:   09/11/17 1035  TempSrc:   PainSc: 0-No pain                 Cleda MccreedyJoseph K Gurkaran Rahm

## 2017-09-11 NOTE — Anesthesia Procedure Notes (Signed)
Date/Time: 09/11/2017 9:30 AM Performed by: Stormy Fabianurtis, Maddix Kliewer, CRNA Pre-anesthesia Checklist: Patient identified, Emergency Drugs available, Suction available and Patient being monitored Patient Re-evaluated:Patient Re-evaluated prior to induction Oxygen Delivery Method: Nasal cannula Induction Type: IV induction Dental Injury: Teeth and Oropharynx as per pre-operative assessment  Comments: Nasal cannula with etCO2 monitoring

## 2017-09-11 NOTE — H&P (Signed)
Melodie Bouillon, MD [address], [place], [place], [address] [address], [address], [place], [place], [address] Phone: [phone]  Fax: [phone]  Primary Care Physician:  Dianne Dun, MD   Pre-Procedure History & Physical: HPI:  Denise Parker is a 67 y.o. female is here for a colonoscopy.   Past Medical History:  Diagnosis Date  . Arthritis   . Bursitis    back  . HLD (hyperlipidemia)   . HOH (hard of hearing)     Past Surgical History:  Procedure Laterality Date  . BREAST CYST ASPIRATION Left 90s   benign  . CATARACT EXTRACTION W/PHACO Left 03/26/2017   Procedure: CATARACT EXTRACTION PHACO AND INTRAOCULAR LENS PLACEMENT (IOC);  Surgeon: [place] Crane, MD;  Location: [facility] ORS;  Service: Ophthalmology;  Laterality: Left;   fluid pack lot #   [id number] [place]    00:23.1 AP%  7.5 CDE   1.71   . CATARACT EXTRACTION W/PHACO Right 07/30/2017   Procedure: CATARACT EXTRACTION PHACO AND INTRAOCULAR LENS PLACEMENT (IOC);  Surgeon: [place] Crane, MD;  Location: [facility] ORS;  Service: Ophthalmology;  Laterality: Right;  fluid pack lot #  [id number]  Exp  03/26/2018 [place]   00:32.1 AP%   5.9 CDE   1.81  . TONSILLECTOMY    . TUBAL LIGATION      Prior to Admission medications   Medication Sig Start Date End Date Taking? Authorizing Provider  atorvastatin (LIPITOR) 20 MG tablet TAKE 1 TABLET BY MOUTH DAILY 07/31/17   Dianne Dun, MD  baclofen (LIORESAL) 10 MG tablet Take 10 mg by mouth at bedtime as needed for muscle spasms.     [provider]  CALCIUM-VITAMIN D PO Take 600 mg of calcium and 800 units of vitamin D daily    [provider]  cetirizine (ZYRTEC) 5 MG tablet Take 2 tablets (10 mg total) by mouth daily. 07/28/17   Dianne Dun, MD  CRANBERRY-VITAMIN C PO Take 1 tablet by mouth daily.     [provider]  denosumab (PROLIA) 60 MG/ML SOSY injection Inject 60 mg into the skin every 6 (six) months.    [provider]    docusate sodium (COLACE) 100 MG capsule Take 200 mg by mouth daily as needed for mild constipation.    [provider]  Flaxseed, Linseed, (FLAXSEED OIL PO) Take 1,000 mg by mouth daily.     [provider]  fluticasone (FLONASE) 50 MCG/ACT nasal spray Place 1 spray into both nostrils daily. 07/28/17   Dianne Dun, MD  GLUCOSAMINE CHONDROITIN COMPLX PO Take 1 tablet by mouth daily.     [provider]  Menthol, Topical Analgesic, (BIOFREEZE EX) Apply 1 application topically daily as needed (bursitis in the hip and back pain).    [provider]  Methylcellulose, Laxative, (CITRUCEL) 500 MG TABS Take 1,000 mg by mouth 2 (two) times daily.    [provider]  Naphazoline-Glycerin-Zinc Sulf (CLEAR EYES MAXIMUM ITCHY EYE OP) Place 2 drops into both eyes daily.    [provider]  naproxen sodium (ALEVE) 220 MG tablet Take 220 mg by mouth 2 (two) times daily as needed (pain).     [provider]  omeprazole (PRILOSEC) 20 MG capsule Take 20 mg by mouth daily.    [provider]  Turmeric 500 MG TABS Take 1,000 mg by mouth.     [provider]  vitamin C (ASCORBIC ACID) 250 MG tablet Take  250 mg by mouth daily.    [provider]  vitamin E 400 UNIT capsule Take 400 Units by mouth daily.    [provider]    Allergies as of 07/29/2017 - Review Complete 07/28/2017  Allergen Reaction Noted  . Probiotic product Itching and Swelling 07/08/2017    Family History  Problem Relation Age of Onset  . Heart disease Mother   . Heart disease Father   . Breast cancer Sister [id number]  . Cancer Sister 1882       colon CA    Social History   Socioeconomic History  . Marital status: Married    Spouse name: Not on file  . Number of children: Not on file  . Years of education: Not on file  . Highest education level: Not on file  Occupational History  . Not on file  Social Needs  . Financial resource strain: Not  on file  . Food insecurity:    Worry: Not on file    Inability: Not on file  . Transportation needs:    Medical: Not on file    Non-medical: Not on file  Tobacco Use  . Smoking status: Never Smoker  . Smokeless tobacco: Never Used  Substance and Sexual Activity  . Alcohol use: Yes    Alcohol/week: 1.8 oz    Types: 3 Glasses of wine per week    Comment: Wine-regular  . Drug use: No  . Sexual activity: Yes  Lifestyle  . Physical activity:    Days per week: Not on file    Minutes per session: Not on file  . Stress: Not on file  Relationships  . Social connections:    Talks on phone: Not on file    Gets together: Not on file    Attends religious service: Not on file    Active member of club or organization: Not on file    Attends meetings of clubs or organizations: Not on file    Relationship status: Not on file  . Intimate partner violence:    Fear of current or ex partner: Not on file    Emotionally abused: Not on file    Physically abused: Not on file    Forced sexual activity: Not on file  Other Topics Concern  . Not on file  Social History Narrative   Commuting from [place] to [place] every two weeks.   Speech therapist, works with children.   Has one 69 yo son in [place] and 4 grandchildren.   Does not have a living will or HPOA   Desires CPR, would not want prolonged life support if futile.    Review of Systems: See HPI, otherwise negative ROS  Physical Exam: BP 102/73   Pulse (!) 58   Temp (!) 96.6 F (35.9 C) (Tympanic)   Resp 17   Ht 5\' 3"  (1.6 m)   Wt 155 lb (70.3 kg)   SpO2 100%   BMI 27.46 kg/m  General:   Alert,  pleasant and cooperative in NAD Head:  Normocephalic and atraumatic. Neck:  Supple; no masses or thyromegaly. Lungs:  Clear throughout to auscultation, normal respiratory effort.    Heart:  +S1, +S2, Regular rate and rhythm, No edema. Abdomen:  Soft, nontender and nondistended. Normal bowel sounds, without guarding, and without rebound.     Neurologic:  Alert and  oriented x4;  grossly normal neurologically.  Impression/Plan: Denise Parker is here for a colonoscopy to be performed for average risk screening.  Risks, benefits, limitations, and alternatives regarding  colonoscopy have been reviewed with the patient.  Questions have been answered.  All parties agreeable.   Pasty Spillers, MD  09/11/2017, 9:23 AM

## 2017-09-11 NOTE — Op Note (Signed)
[facility] Patient Name: Denise Parker Procedure Date: 09/11/2017 9:22 AM MRN: [id number] Account #: [id number] Date of Birth: 02-21-1951 Admit Type: Outpatient Age: 67 Room: [facility] ENDO ROOM 4 Gender: Female Note Status: Finalized Procedure:            Colonoscopy Indications:          Screening for colorectal malignant neoplasm Providers:            Jalaine Riggenbach B. Maximino Greenland MD, MD Referring MD:         Bryn Gulling. Dayton Martes (Referring MD) Medicines:            Monitored Anesthesia Care Complications:        No immediate complications. Procedure:            Pre-Anesthesia Assessment:                       - Prior to the procedure, a History and Physical was                        performed, and patient medications, allergies and                        sensitivities were reviewed. The patient's tolerance of                        previous anesthesia was reviewed.                       - The risks and benefits of the procedure and the                        sedation options and risks were discussed with the                        patient. All questions were answered and informed                        consent was obtained.                       - Patient identification and proposed procedure were                        verified prior to the procedure by the physician, the                        nurse, the anesthetist and the technician. The                        procedure was verified in the pre-procedure area in the                        procedure room in the endoscopy suite.                       - ASA Grade Assessment: III - A patient with severe                        systemic disease.                       -  After reviewing the risks and benefits, the patient                        was deemed in satisfactory condition to undergo the                        procedure.                       After obtaining informed consent, the colonoscope was                 passed under direct vision. Throughout the procedure,                        the patient's blood pressure, pulse, and oxygen                        saturations were monitored continuously. The                        Colonoscope was introduced through the anus and                        advanced to the the cecum, identified by appendiceal                        orifice and ileocecal valve. The colonoscopy was                        performed with ease. The patient tolerated the                        procedure well. The quality of the bowel preparation                        was good. Findings:      The perianal and digital rectal examinations were normal.      The rectum, sigmoid colon, descending colon, transverse colon, ascending       colon and cecum appeared normal.      The retroflexed view of the distal rectum and anal verge was normal and       showed no anal or rectal abnormalities. Impression:           - The rectum, sigmoid colon, descending colon,                        transverse colon, ascending colon and cecum are normal.                       - The distal rectum and anal verge are normal on                        retroflexion view.                       - No specimens collected. Recommendation:       - Discharge patient to home.                       - Resume previous diet.                       -  Continue present medications.                       - Repeat colonoscopy in 10 years for screening purposes.                       - Return to primary care physician as previously                        scheduled.                       - The findings and recommendations were discussed with                        the patient.                       - The findings and recommendations were discussed with                        the patient's family. Procedure Code(s):    --- Professional ---                       [id number], Colorectal cancer screening; colonoscopy on                         individual not meeting criteria for high risk Diagnosis Code(s):    --- Professional ---                       Z12.11, Encounter for screening for malignant neoplasm                        of colon CPT copyright 2017 American Medical Association. All rights reserved. The codes documented in this report are preliminary and upon coder review may  be revised to meet current compliance requirements.  Melodie BouillonVarnita Kreed Kauffman, MD Michel BickersVarnita B. Maximino Greenlandahiliani MD, MD 09/11/2017 10:00:58 AM This report has been signed electronically. Number of Addenda: 0 Note Initiated On: 09/11/2017 9:22 AM Scope Withdrawal Time: 0 hours 15 minutes 19 seconds  Total Procedure Duration: 0 hours 23 minutes 3 seconds       [facility]

## 2017-09-11 NOTE — Anesthesia Preprocedure Evaluation (Signed)
Anesthesia Evaluation  Patient identified by MRN, date of birth, ID band Patient awake    Reviewed: Allergy & Precautions, H&P , NPO status , Patient's Chart, lab work & pertinent test results  History of Anesthesia Complications Negative for: history of anesthetic complications  Airway Mallampati: III  TM Distance: <3 FB Neck ROM: full    Dental  (+) Chipped   Pulmonary neg pulmonary ROS, neg shortness of breath,           Cardiovascular Exercise Tolerance: Good (-) angina(-) Past MI and (-) DOE      Neuro/Psych  Neuromuscular disease negative psych ROS   GI/Hepatic Neg liver ROS, GERD  Medicated and Controlled,  Endo/Other  negative endocrine ROS  Renal/GU negative Renal ROS  negative genitourinary   Musculoskeletal  (+) Arthritis ,   Abdominal   Peds  Hematology negative hematology ROS (+)   Anesthesia Other Findings Past Medical History: No date: Arthritis No date: Bursitis     Comment:  back No date: HLD (hyperlipidemia) No date: HOH (hard of hearing)  Past Surgical History: 90s: BREAST CYST ASPIRATION; Left     Comment:  benign 03/26/2017: CATARACT EXTRACTION W/PHACO; Left     Comment:  Procedure: CATARACT EXTRACTION PHACO AND INTRAOCULAR               LENS PLACEMENT (IOC);  Surgeon: [place] CraneKing, Bradley Mark, MD;                Location: [facility] ORS;  Service: Ophthalmology;  Laterality:              Left;   fluid pack lot #   [id number] [place]    00:23.1 AP%                7.5 CDE   1.71  07/30/2017: CATARACT EXTRACTION W/PHACO; Right     Comment:  Procedure: CATARACT EXTRACTION PHACO AND INTRAOCULAR               LENS PLACEMENT (IOC);  Surgeon: [place] CraneKing, Bradley Mark, MD;                Location: [facility] ORS;  Service: Ophthalmology;  Laterality:              Right;  fluid pack lot #  [id number]  Exp  03/26/2018 [place]                 00:32.1 AP%   5.9 CDE   1.81 No date: TONSILLECTOMY No date: TUBAL LIGATION  BMI    Body Mass Index:  27.46 kg/m      Reproductive/Obstetrics negative OB ROS                             Anesthesia Physical Anesthesia Plan  ASA: III  Anesthesia Plan: General   Post-op Pain Management:    Induction: Intravenous  PONV Risk Score and Plan: Propofol infusion and TIVA  Airway Management Planned: Natural Airway and Nasal Cannula  Additional Equipment:   Intra-op Plan:   Post-operative Plan:   Informed Consent: I have reviewed the patients History and Physical, chart, labs and discussed the procedure including the risks, benefits and alternatives for the proposed anesthesia with the patient or authorized representative who has indicated his/her understanding and acceptance.   Dental Advisory Given  Plan Discussed with: Anesthesiologist, CRNA and Surgeon  Anesthesia Plan Comments: (Patient consented for risks of anesthesia including but not limited to:  -  adverse reactions to medications - risk of intubation if required - damage to teeth, lips or other oral mucosa - sore throat or hoarseness - Damage to heart, brain, lungs or loss of life  Patient voiced understanding.)        Anesthesia Quick Evaluation

## 2017-09-11 NOTE — Transfer of Care (Signed)
Immediate Anesthesia Transfer of Care Note  Patient: Denise Feinsteinatricia Ann Blanchett  Procedure(s) Performed: Procedure(s): COLONOSCOPY WITH PROPOFOL (N/A)  Patient Location: PACU  Anesthesia Type:General  Level of Consciousness: sedated  Airway & Oxygen Therapy: Patient Spontanous Breathing and Patient connected to face mask oxygen  Post-op Assessment: Report given to RN and Post -op Vital signs reviewed and stable  Post vital signs: Reviewed and stable  Last Vitals:  Vitals:   09/11/17 1004 09/11/17 1005  BP:  (!) 88/60  Pulse: (!) 48 (!) 50  Resp: (!) 22 (!) 21  Temp: (!) 36.2 C (!) 36.2 C  SpO2: 100% 100%    Complications: No apparent anesthesia complications

## 2017-09-11 NOTE — Anesthesia Post-op Follow-up Note (Signed)
Anesthesia QCDR form completed.        

## 2017-09-29 ENCOUNTER — Ambulatory Visit (INDEPENDENT_AMBULATORY_CARE_PROVIDER_SITE_OTHER): Payer: Medicare Other | Admitting: Behavioral Health

## 2017-09-29 DIAGNOSIS — Z23 Encounter for immunization: Secondary | ICD-10-CM | POA: Diagnosis not present

## 2017-09-29 NOTE — Progress Notes (Signed)
Patient presents in clinic for #2 Shingrix vaccination. IM injection was given in the left deltoid. Patient tolerated the injection well. No s/s of a reaction prior to patient leaving the nurse visit.

## 2017-10-14 DIAGNOSIS — H903 Sensorineural hearing loss, bilateral: Secondary | ICD-10-CM | POA: Diagnosis not present

## 2017-11-03 ENCOUNTER — Telehealth: Payer: Self-pay | Admitting: Behavioral Health

## 2017-11-03 NOTE — Telephone Encounter (Signed)
Received Summary of Benefits for Prolia injection. The estimated out of pocket expense is $50. PA is required, authorization # [id number] for DOS 04/07/17-04-07-18. Patient was made aware of this information & verbalized understanding. Nurse visit appointment has been scheduled for 11/05/17 at 3:00 PM.

## 2017-11-05 ENCOUNTER — Ambulatory Visit (INDEPENDENT_AMBULATORY_CARE_PROVIDER_SITE_OTHER): Payer: Medicare Other | Admitting: Behavioral Health

## 2017-11-05 DIAGNOSIS — M810 Age-related osteoporosis without current pathological fracture: Secondary | ICD-10-CM | POA: Diagnosis not present

## 2017-11-05 MED ORDER — DENOSUMAB 60 MG/ML ~~LOC~~ SOSY
60.0000 mg | PREFILLED_SYRINGE | Freq: Once | SUBCUTANEOUS | Status: AC
  Administered 2017-11-05: 60 mg via SUBCUTANEOUS

## 2017-11-05 NOTE — Progress Notes (Signed)
Patient came in clinic today for Prolia injection. SQ injection was given in the left arm. Patient tolerated the injection well. No signs or symptoms of a reaction prior to patient leaving the nurse visit.

## 2018-03-24 ENCOUNTER — Ambulatory Visit (INDEPENDENT_AMBULATORY_CARE_PROVIDER_SITE_OTHER): Payer: Medicare Other | Admitting: Behavioral Health

## 2018-03-24 DIAGNOSIS — Z23 Encounter for immunization: Secondary | ICD-10-CM | POA: Diagnosis not present

## 2018-03-24 NOTE — Progress Notes (Signed)
Patient came in clinic today for Influenza vaccination. IM injection was given in the right deltoid. Patient tolerated the injection well. There were no signs or symptoms of a reaction prior to patient leaving the nurse visit.

## 2018-03-25 ENCOUNTER — Ambulatory Visit: Payer: Medicare Other

## 2018-05-04 ENCOUNTER — Other Ambulatory Visit: Payer: Self-pay | Admitting: Family Medicine

## 2018-05-04 NOTE — Telephone Encounter (Signed)
Per Dr. Dayton Martes, Pt must bee seen to F/U on all health maintaince meds

## 2018-05-11 ENCOUNTER — Ambulatory Visit: Payer: Medicare Other

## 2018-05-13 ENCOUNTER — Encounter: Payer: Self-pay | Admitting: Family Medicine

## 2018-05-13 ENCOUNTER — Telehealth: Payer: Self-pay

## 2018-05-13 NOTE — Telephone Encounter (Signed)
Copied from CRM [id number]. Topic: General - Other >> May 13, 2018 12:06 PM Mcneil, Ja-Kwan wrote: Reason for CRM: Pt called to follow up on the prolia injection. Pt stated she has not heard from anyone and she is concerned because she was suppose to have it done on 05/06/18. Pt would like a call back to discuss.

## 2018-05-14 NOTE — Telephone Encounter (Signed)
RN reached out to the patient and provided an update regarding Prolia injection. Prior Denise Parker is still pending at this time. Patient verbalized understanding and did not have any further questions or concerns. Will schedule nurse visit appointment once approval has been obtained.

## 2018-05-18 ENCOUNTER — Other Ambulatory Visit: Payer: Self-pay | Admitting: Family Medicine

## 2018-05-18 NOTE — Telephone Encounter (Signed)
Copied from CRM [id number]. Topic: Quick Communication - Rx Refill/Question >> May 18, 2018  4:20 PM Maia Petties wrote: Medication: cetirizine  & flonase - pt requesting refills for these be sent to pharmacy - 30 day supply is fine Has the patient contacted their pharmacy? Yes - no refills remaining Preferred Pharmacy (with phone number or street name): [id number] [place] Rough, [place] - [address] AT [facility] OF [address] [phone] (Phone) [phone] (Fax)

## 2018-05-19 ENCOUNTER — Other Ambulatory Visit: Payer: Self-pay

## 2018-05-19 MED ORDER — CETIRIZINE HCL 5 MG PO TABS: 10.0000 mg | ORAL_TABLET | Freq: Every day | ORAL | 6 refills | Status: DC

## 2018-05-19 MED ORDER — FLUTICASONE PROPIONATE 50 MCG/ACT NA SUSP: 1.0000 | Freq: Every day | NASAL | 6 refills | Status: DC

## 2018-05-19 NOTE — Telephone Encounter (Signed)
Medications sent to pharmacy

## 2018-05-25 ENCOUNTER — Encounter: Payer: Self-pay | Admitting: Family Medicine

## 2018-05-25 ENCOUNTER — Telehealth: Payer: Self-pay | Admitting: Behavioral Health

## 2018-05-25 NOTE — Telephone Encounter (Signed)
Received Summary of Benefits for Prolia injection. Prior authorization is required. The estimated out of pocket expense for the injection is $380.   Obtained PA Approval for the following dates of service: 05/19/2018-05/19/2019  Informed patient about benefits. She voiced understanding. Nurse visit appointment has been scheduled for 05/26/2018 at 10:00 AM.

## 2018-05-25 NOTE — Telephone Encounter (Signed)
Questions for Screening COVID-19  Symptom onset: None  Travel or Contacts: Not in the past month; last traveled to [place]  During this illness, did/does the patient experience any of the following symptoms? Fever >100.29F []   Yes [x]   No []   Unknown Subjective fever (felt feverish) []   Yes [x]   No []   Unknown Chills []   Yes [x]   No []   Unknown Muscle aches (myalgia) []   Yes [x]   No []   Unknown Runny nose (rhinorrhea) []   Yes [x]   No []   Unknown Sore throat []   Yes [x]   No []   Unknown Cough (new onset or worsening of chronic cough) []   Yes [x]   No []   Unknown Shortness of breath (dyspnea) []   Yes [x]   No []   Unknown Nausea or vomiting []   Yes [x]   No []   Unknown Headache []   Yes [x]   No []   Unknown Abdominal pain  []   Yes [x]   No []   Unknown Diarrhea (?3 loose/looser than normal stools/24hr period) []   Yes [x]   No []   Unknown Other, specify:  Patient risk factors: Smoker? []   Current []   Former [x]   Never If female, currently pregnant? []   Yes [x]   No  Patient Active Problem List   Diagnosis Date Noted  . Encounter for screening colonoscopy   . GERD (gastroesophageal reflux disease) 03/03/2017  . Trochanteric bursitis, right hip 10/21/2016  . Osteoporosis 09/09/2016  . Back pain 07/15/2016  . Decreased hearing 07/15/2016  . Myofascial pain syndrome 10/08/2015  . Arthritis 06/20/2015  . HLD (hyperlipidemia) 06/20/2015  . Herpes zoster 04/13/2013  . Metatarsalgia 09/27/2012  . Allergic rhinitis 06/16/2011    Plan:  []   High risk for COVID-19 with red flags go to ED (with CP, SOB, weak/lightheaded, or fever > 101.5). Call ahead.  []   High risk for COVID-19 but stable. Inform provider and coordinate time for [facility] visit.   []   No red flags but URI signs or symptoms okay for [facility] visit.

## 2018-05-26 ENCOUNTER — Other Ambulatory Visit: Payer: Self-pay

## 2018-05-26 ENCOUNTER — Ambulatory Visit (INDEPENDENT_AMBULATORY_CARE_PROVIDER_SITE_OTHER): Payer: Medicare Other | Admitting: Behavioral Health

## 2018-05-26 DIAGNOSIS — M810 Age-related osteoporosis without current pathological fracture: Secondary | ICD-10-CM

## 2018-05-26 MED ORDER — DENOSUMAB 60 MG/ML ~~LOC~~ SOSY
60.0000 mg | PREFILLED_SYRINGE | Freq: Once | SUBCUTANEOUS | Status: AC
  Administered 2018-05-26: 10:00:00 60 mg via SUBCUTANEOUS

## 2018-05-26 NOTE — Progress Notes (Signed)
I reviewed RN's note, was available for consultation, and agree with documentation and plan.  

## 2018-05-26 NOTE — Progress Notes (Signed)
Patient presents in clinic today for Prolia injection. SQ injection was given in the right arm. Patient tolerated it well. No signs or symptoms of a reaction were noted prior to patient leaving the nurse visit.

## 2018-06-29 ENCOUNTER — Ambulatory Visit (INDEPENDENT_AMBULATORY_CARE_PROVIDER_SITE_OTHER): Payer: Medicare Other | Admitting: Family Medicine

## 2018-06-29 ENCOUNTER — Encounter: Payer: Self-pay | Admitting: Family Medicine

## 2018-06-29 VITALS — Ht 63.0 in

## 2018-06-29 DIAGNOSIS — T7840XA Allergy, unspecified, initial encounter: Secondary | ICD-10-CM | POA: Diagnosis not present

## 2018-06-29 MED ORDER — PREDNISONE 10 MG (21) PO TBPK: ORAL_TABLET | ORAL | 0 refills | Status: DC

## 2018-06-29 NOTE — Progress Notes (Signed)
Virtual Visit via Video Note  I connected with Denise Parker on 06/29/18 at  1:30 PM EDT by a video enabled telemedicine application and verified that I am speaking with the correct person using two identifiers.  Location: Patient: home Provider:    I discussed the limitations of evaluation and management by telemedicine and the availability of in person appointments. The patient expressed understanding and agreed to proceed.  History of Present Illness:    Observations/Objective:   Assessment and Plan:   Follow Up Instructions:    I discussed the assessment and treatment plan with the patient. The patient was provided an opportunity to ask questions and all were answered. The patient agreed with the plan and demonstrated an understanding of the instructions.   The patient was advised to call back or seek an in-person evaluation if the symptoms worsen or if the condition fails to improve as anticipated.  I provided 20   Established Patient Office Visit  Subjective:  Patient ID: Denise Parker, female    DOB: 1950-06-18  Age: 68 y.o. MRN: [id number]  CC:  Chief Complaint  Patient presents with  . Allergic Reaction    HPI Denise Parker presents for evaluation and treatment of an apparent reaction to a multi-vitamin regimen that she had started prior to her reaction.  She describes pruritus in her neck, back arms and swelling with erythema on her outer ears.  She is currently taking Zyrtec 5 mg in the morning and Flonase for allergy rhinitis.  She had added nightly Benadryl for the treatment of this problem.  She denies fevers chills, difficulty breathing, wheezing, swelling in her throat or rash.  There is been no rash associated with her pruritus as well.  Past Medical History:  Diagnosis Date  . Arthritis   . Bursitis    back  . HLD (hyperlipidemia)   . HOH (hard of hearing)     Past Surgical History:  Procedure Laterality Date  . BREAST CYST  ASPIRATION Left 90s   benign  . CATARACT EXTRACTION W/PHACO Left 03/26/2017   Procedure: CATARACT EXTRACTION PHACO AND INTRAOCULAR LENS PLACEMENT (IOC);  Surgeon: Nevada Crane, MD;  Location: [facility] ORS;  Service: Ophthalmology;  Laterality: Left;   fluid pack lot #   [id number] [place]    00:23.1 AP%  7.5 CDE   1.71   . CATARACT EXTRACTION W/PHACO Right 07/30/2017   Procedure: CATARACT EXTRACTION PHACO AND INTRAOCULAR LENS PLACEMENT (IOC);  Surgeon: Nevada Crane, MD;  Location: [facility] ORS;  Service: Ophthalmology;  Laterality: Right;  fluid pack lot #  [id number] H  Exp  03/26/2018 [place]   00:32.1 AP%   5.9 CDE   1.81  . COLONOSCOPY WITH PROPOFOL N/A 09/11/2017   Procedure: COLONOSCOPY WITH PROPOFOL;  Surgeon: Pasty Spillers, MD;  Location: [facility] ENDOSCOPY;  Service: Gastroenterology;  Laterality: N/A;  . TONSILLECTOMY    . TUBAL LIGATION      Family History  Problem Relation Age of Onset  . Heart disease Mother   . Heart disease Father   . Breast cancer Sister 63  . Cancer Sister 2       colon CA    Social History   Socioeconomic History  . Marital status: Married    Spouse name: Not on file  . Number of children: Not on file  . Years of education: Not on file  . Highest education level: Not on file  Occupational History  . Not on file  Social  Needs  . Financial resource strain: Not on file  . Food insecurity:    Worry: Not on file    Inability: Not on file  . Transportation needs:    Medical: Not on file    Non-medical: Not on file  Tobacco Use  . Smoking status: Never Smoker  . Smokeless tobacco: Never Used  Substance and Sexual Activity  . Alcohol use: Yes    Alcohol/week: 3.0 standard drinks    Types: 3 Glasses of wine per week    Comment: Wine-regular  . Drug use: No  . Sexual activity: Yes  Lifestyle  . Physical activity:    Days per week: Not on file    Minutes per session: Not on file  . Stress: Not on file  Relationships  . Social connections:     Talks on phone: Not on file    Gets together: Not on file    Attends religious service: Not on file    Active member of club or organization: Not on file    Attends meetings of clubs or organizations: Not on file    Relationship status: Not on file  . Intimate partner violence:    Fear of current or ex partner: Not on file    Emotionally abused: Not on file    Physically abused: Not on file    Forced sexual activity: Not on file  Other Topics Concern  . Not on file  Social History Narrative   Commuting from [place] to [place] every two weeks.   Speech therapist, works with children.   Has one 82 yo son in [place] and 4 grandchildren.   Does not have a living will or HPOA   Desires CPR, would not want prolonged life support if futile.    Outpatient Medications Prior to Visit  Medication Sig Dispense Refill  . atorvastatin (LIPITOR) 20 MG tablet TAKE 1 TABLET BY MOUTH DAILY 30 tablet 0  . baclofen (LIORESAL) 10 MG tablet Take 10 mg by mouth at bedtime as needed for muscle spasms.     Marland Kitchen CALCIUM-VITAMIN D PO Take 600 mg of calcium and 800 units of vitamin D daily    . cetirizine (ZYRTEC) 5 MG tablet Take 2 tablets (10 mg total) by mouth daily. 30 tablet 6  . CRANBERRY-VITAMIN C PO Take 1 tablet by mouth daily.     Marland Kitchen denosumab (PROLIA) 60 MG/ML SOSY injection Inject 60 mg into the skin every 6 (six) months.    . docusate sodium (COLACE) 100 MG capsule Take 200 mg by mouth daily as needed for mild constipation.    . Flaxseed, Linseed, (FLAXSEED OIL PO) Take 1,000 mg by mouth daily.     . fluticasone (FLONASE) 50 MCG/ACT nasal spray Place 1 spray into both nostrils daily. 16 g 6  . GLUCOSAMINE CHONDROITIN COMPLX PO Take 1 tablet by mouth daily.     . Menthol, Topical Analgesic, (BIOFREEZE EX) Apply 1 application topically daily as needed (bursitis in the hip and back pain).    . Methylcellulose, Laxative, (CITRUCEL) 500 MG TABS Take 1,000 mg by mouth 2 (two) times daily.    .  Naphazoline-Glycerin-Zinc Sulf (CLEAR EYES MAXIMUM ITCHY EYE OP) Place 2 drops into both eyes daily.    . naproxen sodium (ALEVE) 220 MG tablet Take 220 mg by mouth 2 (two) times daily as needed (pain).     Marland Kitchen omeprazole (PRILOSEC) 20 MG capsule Take 20 mg by mouth daily.    . Turmeric 500  MG TABS Take 1,000 mg by mouth.     . vitamin C (ASCORBIC ACID) 250 MG tablet Take 250 mg by mouth daily.    . vitamin E 400 UNIT capsule Take 400 Units by mouth daily.     No facility-administered medications prior to visit.     Allergies  Allergen Reactions  . Probiotic Product Itching and Swelling    Can't remember the name, it was a women's probiotic supplement. It cause burning, itching and swelling of the ears    ROS Review of Systems  Constitutional: Negative for chills, diaphoresis, fatigue, fever and unexpected weight change.  HENT: Positive for postnasal drip and sneezing. Negative for congestion and sore throat.   Eyes: Negative for photophobia and visual disturbance.  Respiratory: Negative for cough, chest tightness, shortness of breath and wheezing.   Cardiovascular: Negative.   Gastrointestinal: Negative.  Negative for nausea and vomiting.  Genitourinary: Negative.   Musculoskeletal: Negative for arthralgias and myalgias.  Skin: Negative for color change and rash.  Allergic/Immunologic: Positive for environmental allergies. Negative for immunocompromised state.  Neurological: Negative for headaches.  Hematological: Does not bruise/bleed easily.  Psychiatric/Behavioral: Negative.       Objective:    Physical Exam  Constitutional: She is oriented to person, place, and time. She appears well-developed and well-nourished. No distress.  HENT:  Head: Normocephalic and atraumatic.  Right Ear: External ear normal.  Left Ear: External ear normal.  Eyes: Conjunctivae are normal. Right eye exhibits no discharge. Left eye exhibits no discharge. No scleral icterus.  Neck: No JVD present.  No tracheal deviation present.  Pulmonary/Chest: Effort normal.  Neurological: She is alert and oriented to person, place, and time.  Skin: Skin is warm and dry. No rash (no rash visualized on anterior neck. ) noted. She is not diaphoretic. No erythema.  Psychiatric: She has a normal mood and affect. Her behavior is normal.    Ht  (1.6 m)   BMI 27.46 kg/m  Wt Readings from Last 3 Encounters:  09/11/17 155 lb (70.3 kg)  07/30/17 160 lb (72.6 kg)  07/28/17 160 lb 9.6 oz (72.8 kg)     Health Maintenance Due  Topic Date Due  . PNA vac Low Risk Adult (2 of 2 - PPSV23) 11/23/2017    There are no preventive care reminders to display for this patient.  Lab Results  Component Value Date   TSH 1.71 07/09/2017   Lab Results  Component Value Date   WBC 5.8 06/14/2015   HGB 13.1 06/14/2015   HCT 39.5 06/14/2015   MCV 84.1 06/14/2015   PLT 240.0 06/14/2015   Lab Results  Component Value Date   NA 142 07/09/2017   K 4.9 07/09/2017   CO2 28 07/09/2017   GLUCOSE 89 07/09/2017   BUN 9 07/09/2017   CREATININE 0.70 07/09/2017   BILITOT 0.5 07/09/2017   ALKPHOS 31 (L) 07/09/2017   AST 12 07/09/2017   ALT 12 07/09/2017   PROT 6.6 07/09/2017   ALBUMIN 4.1 07/09/2017   CALCIUM 9.1 07/09/2017   GFR 88.32 07/09/2017   Lab Results  Component Value Date   CHOL 196 07/09/2017   Lab Results  Component Value Date   HDL 62.10 07/09/2017   Lab Results  Component Value Date   LDLCALC 123 (H) 07/09/2017   Lab Results  Component Value Date   TRIG 51.0 07/09/2017   Lab Results  Component Value Date   CHOLHDL 3 07/09/2017   No results found for:  HGBA1C    Assessment & Plan:   Problem List Items Addressed This Visit      Other   Allergic reaction - Primary   Relevant Medications   predniSONE (STERAPRED UNI-PAK 21 TAB) 10 MG (21) TBPK tablet      Meds ordered this encounter  Medications  . predniSONE (STERAPRED UNI-PAK 21 TAB) 10 MG (21) TBPK tablet    Sig: Take  6 today, 5 tomorrow, 4 the next day and then 3, 2, 1 and stop    Dispense:  21 tablet    Refill:  0    Follow-up: Return if symptoms worsen or fail to improve.    Mliss SaxWilliam Alfred Sharon Stapel, MD20 minutes of non-face-to-face time during this encounter.  Advised patient to start taking 10 mg of Zyrtec at night.  She will continue with the Flonase.  We will start her 6-day Dosepak of prednisone tomorrow.  She will follow-up with Dr. Clifton CustardAaron if not improving in a week.

## 2018-06-30 IMAGING — MG MM DIGITAL SCREENING BILAT W/ TOMO W/ CAD
9 of 13 series · 9 of 29 positions shown · non-contrast
Comparison: Previous exam(s).

CLINICAL DATA: Screening.

EXAM:
2D DIGITAL SCREENING BILATERAL MAMMOGRAM WITH CAD AND ADJUNCT TOMO

[L XCCM]
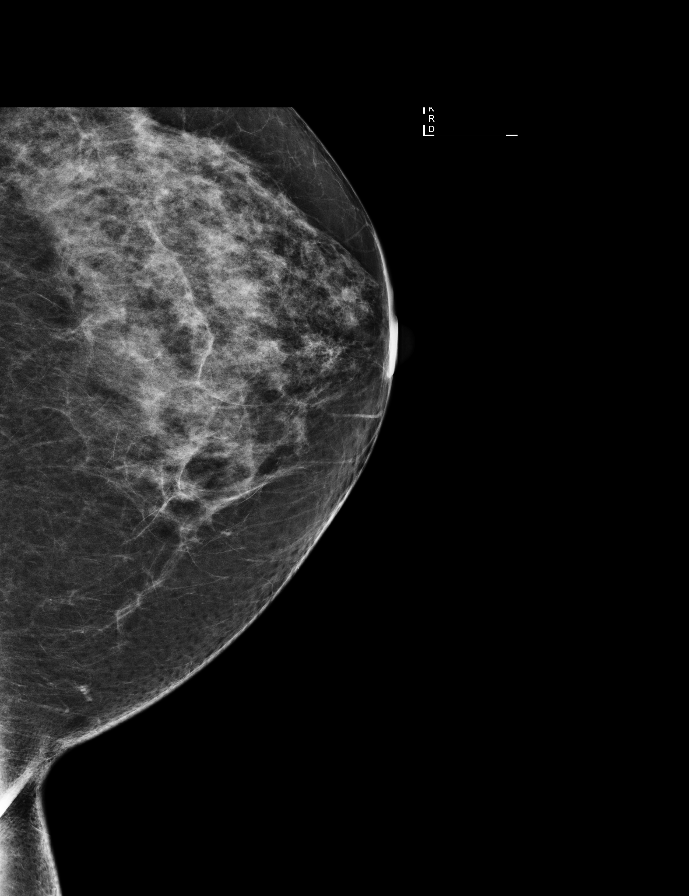

[L MLO]
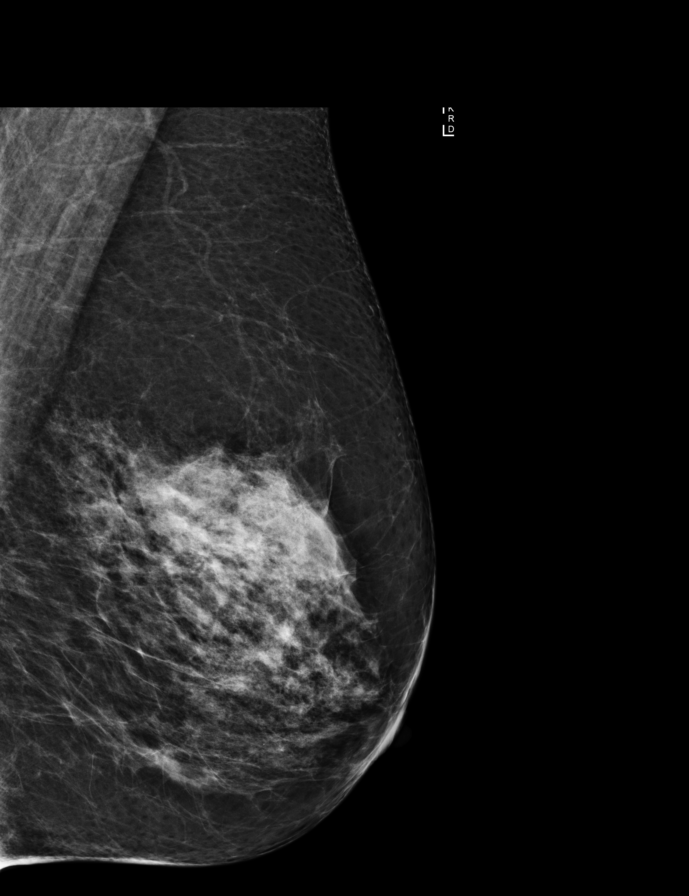

[R CC]
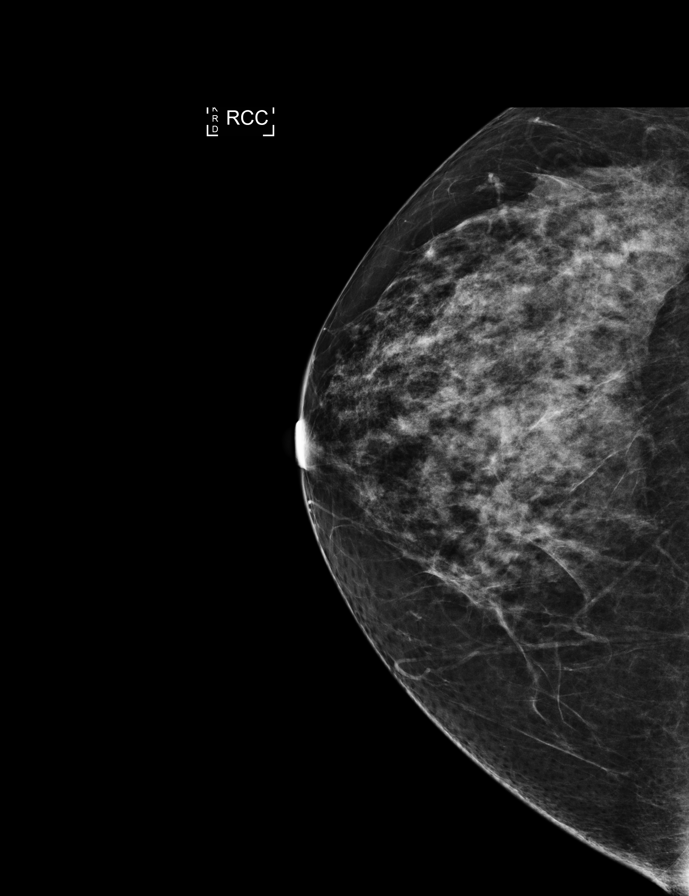

[R MLO synth-2D]
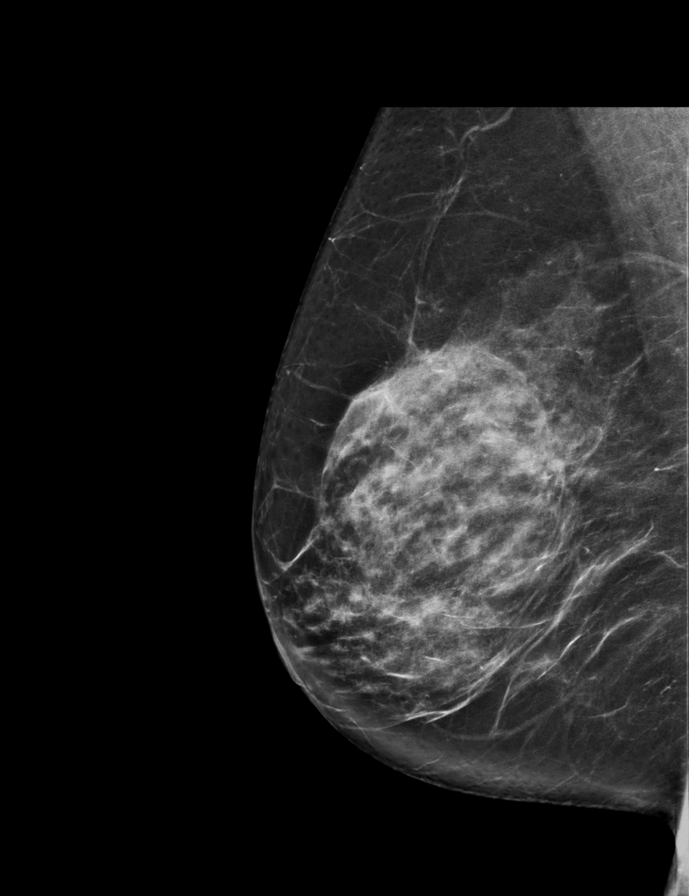

[L CC]
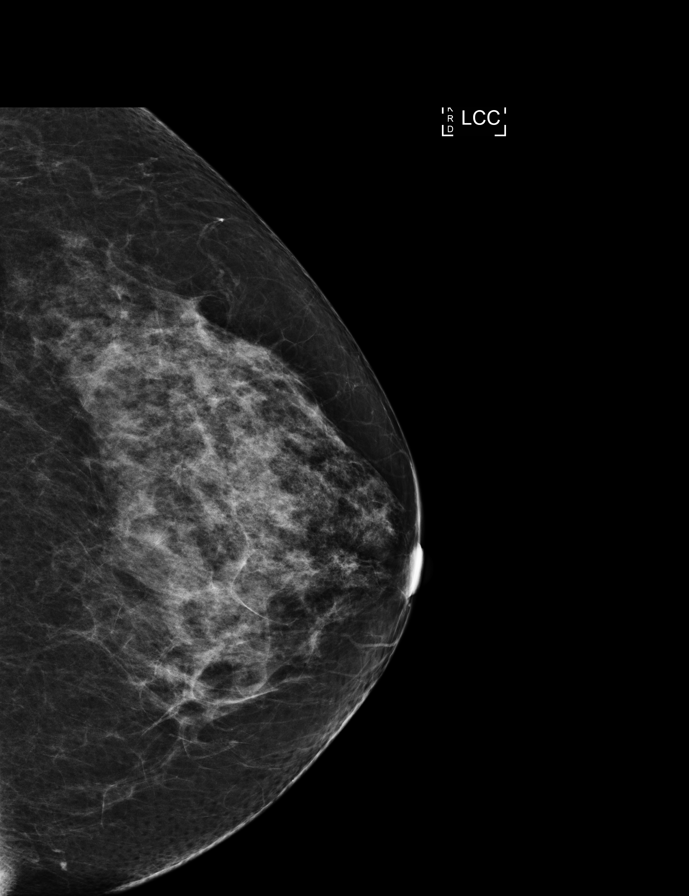

[L CC synth-2D]
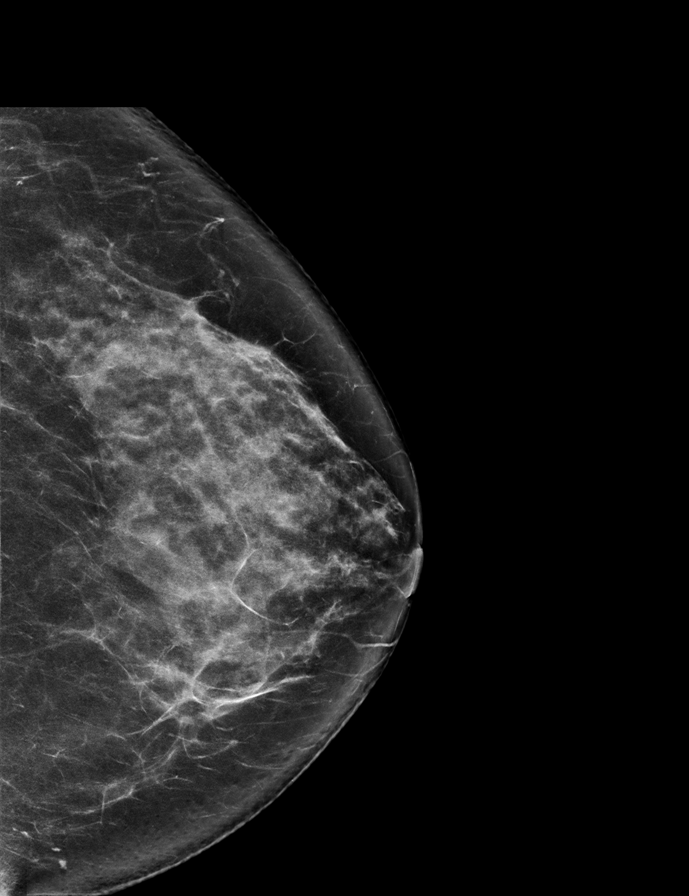

[L MLO synth-2D]
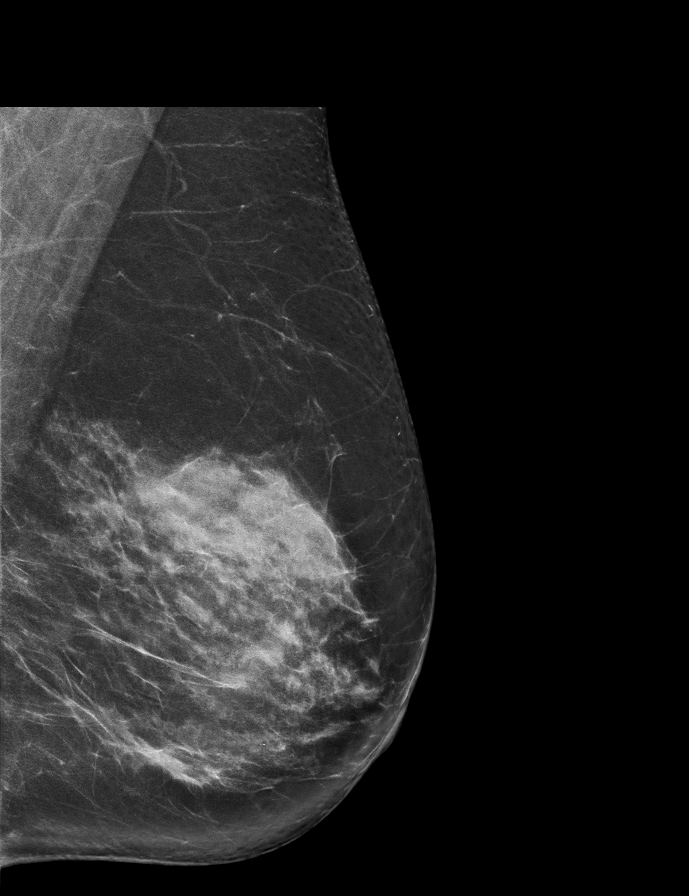

[R MLO]
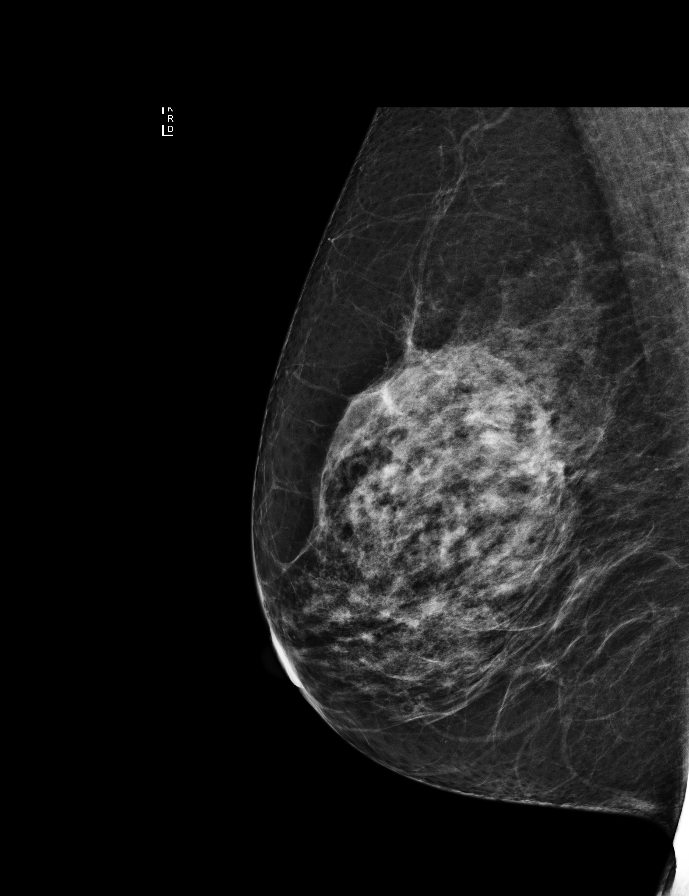

[R CC synth-2D]
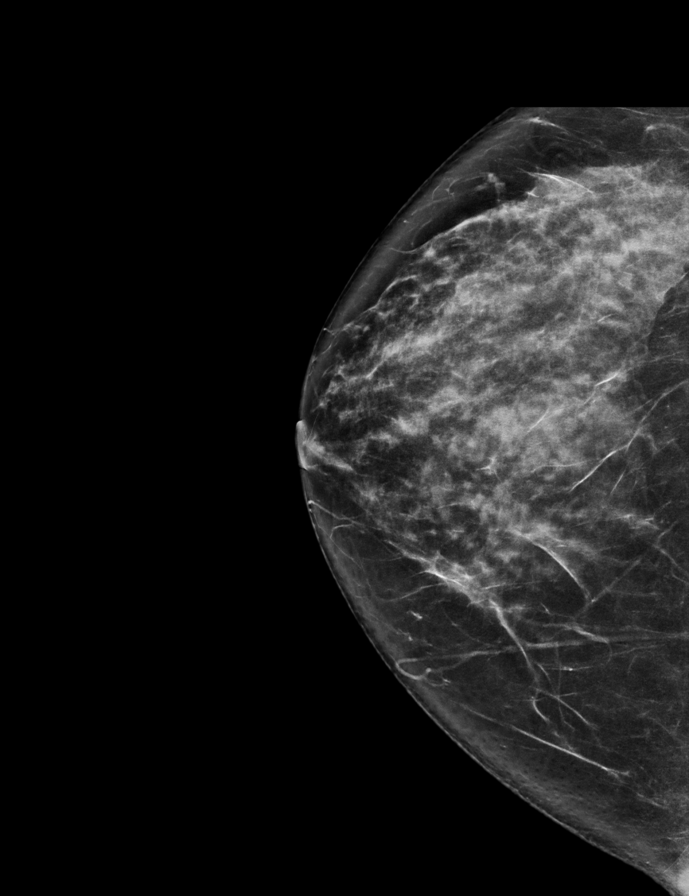

[9 of 29 positions shown; findings below may reference images not displayed]

ACR Breast Density Category c: The breast tissue is heterogeneously
dense, which may obscure small masses.
FINDINGS: There are no findings suspicious for malignancy. Images were
processed with CAD.
IMPRESSION: No mammographic evidence of malignancy. A result letter of this
screening mammogram will be mailed directly to the patient.

RECOMMENDATION:
Screening mammogram in one year. (Code:TN-[id number])

BI-RADS CATEGORY  1: Negative.

## 2018-08-11 ENCOUNTER — Other Ambulatory Visit: Payer: Self-pay | Admitting: Family Medicine

## 2018-08-11 DIAGNOSIS — Z1231 Encounter for screening mammogram for malignant neoplasm of breast: Secondary | ICD-10-CM

## 2018-09-20 ENCOUNTER — Ambulatory Visit
Admission: RE | Admit: 2018-09-20 | Discharge: 2018-09-20 | Disposition: A | Discharge status: Home / Self Care | Payer: Medicare Other | Source: Ambulatory Visit | Attending: Family Medicine | Admitting: Family Medicine

## 2018-09-20 ENCOUNTER — Other Ambulatory Visit: Payer: Self-pay

## 2018-09-20 DIAGNOSIS — Z1231 Encounter for screening mammogram for malignant neoplasm of breast: Secondary | ICD-10-CM

## 2018-11-11 ENCOUNTER — Ambulatory Visit (INDEPENDENT_AMBULATORY_CARE_PROVIDER_SITE_OTHER): Payer: Medicare Other | Admitting: Family Medicine

## 2018-11-11 ENCOUNTER — Encounter: Payer: Self-pay | Admitting: Family Medicine

## 2018-11-11 ENCOUNTER — Other Ambulatory Visit: Payer: Self-pay

## 2018-11-11 DIAGNOSIS — H9202 Otalgia, left ear: Secondary | ICD-10-CM

## 2018-11-11 MED ORDER — AMOXICILLIN-POT CLAVULANATE 875-125 MG PO TABS: 1.0000 | ORAL_TABLET | Freq: Two times a day (BID) | ORAL | 0 refills | Status: DC

## 2018-11-11 NOTE — Progress Notes (Signed)
Denise Feinsteinatricia Ann Parker - 68 y.o. female MRN [id number]  Date of birth: August 11, 1950  Subjective Chief Complaint  Patient presents with  . Pain    pain in left ear mostly at night/ sinus drainage back of throat/ 3 or 4 days/flonase and     HPI Denise Feinsteinatricia Ann Fleisher is a 68 y.o. female here today with complaint of L ear pain.  She reports 3-4 days of sinus congestion and drainage with ear fullness.  She started having ear pain a couple of days ago on the L side however pain had resolved by this morning.  She wanted to still come in today for be sure everything looked ok. She has been using cetirizine and flonase nasal spray consistently, which she thinks is helping.  She denies fever, chills, worsening hearing difficulty, headache, purulent sinus drainage or drainage from the ears.   ROS:  A comprehensive ROS was completed and negative except as noted per HPI  Allergies  Allergen Reactions  . Probiotic Product Itching and Swelling    Can't remember the name, it was a women's probiotic supplement. It cause burning, itching and swelling of the ears    Past Medical History:  Diagnosis Date  . Arthritis   . Bursitis    back  . HLD (hyperlipidemia)   . HOH (hard of hearing)     Past Surgical History:  Procedure Laterality Date  . BREAST CYST ASPIRATION Left 90s   benign  . CATARACT EXTRACTION W/PHACO Left 03/26/2017   Procedure: CATARACT EXTRACTION PHACO AND INTRAOCULAR LENS PLACEMENT (IOC);  Surgeon: Nevada CraneKing, Bradley Mark, MD;  Location: [facility] ORS;  Service: Ophthalmology;  Laterality: Left;   fluid pack lot #   [id number] H [place]    00:23.1 AP%  7.5 CDE   1.71   . CATARACT EXTRACTION W/PHACO Right 07/30/2017   Procedure: CATARACT EXTRACTION PHACO AND INTRAOCULAR LENS PLACEMENT (IOC);  Surgeon: Nevada CraneKing, Bradley Mark, MD;  Location: [facility] ORS;  Service: Ophthalmology;  Laterality: Right;  fluid pack lot #  [id number] H  Exp  03/26/2018 [place]   00:32.1 AP%   5.9 CDE   1.81  . COLONOSCOPY WITH PROPOFOL N/A  09/11/2017   Procedure: COLONOSCOPY WITH PROPOFOL;  Surgeon: Pasty Spillersahiliani, Varnita B, MD;  Location: [facility] END[facility];  Service: Gastroenterology;  Laterality: N/A;  . TONSILLECTOMY    . TUBAL LIGATION      Social History   Socioeconomic History  . Marital status: Married    Spouse name: Not on file  . Number of children: Not on file  . Years of education: Not on file  . Highest education level: Not on file  Occupational History  . Not on file  Social Needs  . Financial resource strain: Not on file  . Food insecurity    Worry: Not on file    Inability: Not on file  . Transportation needs    Medical: Not on file    Non-medical: Not on file  Tobacco Use  . Smoking status: Never Smoker  . Smokeless tobacco: Never Used  Substance and Sexual Activity  . Alcohol use: Yes    Alcohol/week: 3.0 standard drinks    Types: 3 Glasses of wine per week    Comment: Wine-regular  . Drug use: No  . Sexual activity: Yes  Lifestyle  . Physical activity    Days per week: Not on file    Minutes per session: Not on file  . Stress: Not on file  Relationships  . Social connections  Talks on phone: Not on file    Gets together: Not on file    Attends religious service: Not on file    Active member of club or organization: Not on file    Attends meetings of clubs or organizations: Not on file    Relationship status: Not on file  Other Topics Concern  . Not on file  Social History Narrative   Commuting from [place] to [place] every two weeks.   Speech therapist, works with children.   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   Does not have a living will or HPOA   Desires CPR, would not want prolonged life support if futile.    Family History  Problem Relation Age of Onset  . Heart disease Mother   . Heart disease Father   . Breast cancer Sister 37  . Cancer Sister 68       colon CA    Health Maintenance  Topic Date Due  . PNA vac Low Risk Adult (2 of 2 - PPSV23) 11/23/2017  .  INFLUENZA VACCINE  09/25/2018  . Fecal DNA (Cologuard)  07/24/2019  . MAMMOGRAM  09/20/2019  . TETANUS/TDAP  01/24/2021  . DEXA SCAN  Completed  . Hepatitis C Screening  Completed    ----------------------------------------------------------------------------------------------------------------------------------------------------------------------------------------------------------------- Physical Exam BP 130/70   Pulse (!) 58   Temp 97.8 F (36.6 C) (Oral)   Ht 5\' 3"  (1.6 m)   Wt 164 lb 12.8 oz (74.8 kg)   SpO2 98%   BMI 29.19 kg/m   Physical Exam Constitutional:      Appearance: Normal appearance.  HENT:     Head: Normocephalic and atraumatic.     Right Ear: Tympanic membrane normal.     Left Ear: Tympanic membrane normal.     Nose: Nose normal.     Mouth/Throat:     Mouth: Mucous membranes are moist.  Eyes:     General: No scleral icterus. Neck:     Musculoskeletal: Neck supple.  Cardiovascular:     Rate and Rhythm: Normal rate and regular rhythm.  Pulmonary:     Effort: Pulmonary effort is normal.     Breath sounds: Normal breath sounds.  Skin:    General: Skin is warm and dry.  Neurological:     General: No focal deficit present.     Mental Status: She is alert.  Psychiatric:        Mood and Affect: Mood normal.        Behavior: Behavior normal.     ------------------------------------------------------------------------------------------------------------------------------------------------------------------------------------------------------------------- Assessment and Plan  Left ear pain -Likely related to transient serous OM, appears to have resolved at this time.  -Continue flonase and cetirizine -I did send in antibiotic of augmentin to start if her symptoms worsen significantly again.

## 2018-11-11 NOTE — Patient Instructions (Signed)
It was very nice to meet you today! I have sent in an antibiotic but I don't think that you need it right now.  If the pain worsens again or you develop sinus pain, or fever please go ahead and start this.    Eustachian Tube Dysfunction  Eustachian tube dysfunction refers to a condition in which a blockage develops in the narrow passage that connects the middle ear to the back of the nose (eustachian tube). The eustachian tube regulates air pressure in the middle ear by letting air move between the ear and nose. It also helps to drain fluid from the middle ear space. Eustachian tube dysfunction can affect one or both ears. When the eustachian tube does not function properly, air pressure, fluid, or both can build up in the middle ear. What are the causes? This condition occurs when the eustachian tube becomes blocked or cannot open normally. Common causes of this condition include:  Ear infections.  Colds and other infections that affect the nose, mouth, and throat (upper respiratory tract).  Allergies.  Irritation from cigarette smoke.  Irritation from stomach acid coming up into the esophagus (gastroesophageal reflux). The esophagus is the tube that carries food from the mouth to the stomach.  Sudden changes in air pressure, such as from descending in an airplane or scuba diving.  Abnormal growths in the nose or throat, such as: ? Growths that line the nose (nasal polyps). ? Abnormal growth of cells (tumors). ? Enlarged tissue at the back of the throat (adenoids). What increases the risk? You are more likely to develop this condition if:  You smoke.  You are overweight.  You are a child who has: ? Certain birth defects of the mouth, such as cleft palate. ? Large tonsils or adenoids. What are the signs or symptoms? Common symptoms of this condition include:  A feeling of fullness in the ear.  Ear pain.  Clicking or popping noises in the ear.  Ringing in the ear.   Hearing loss.  Loss of balance.  Dizziness. Symptoms may get worse when the air pressure around you changes, such as when you travel to an area of high elevation, fly on an airplane, or go scuba diving. How is this diagnosed? This condition may be diagnosed based on:  Your symptoms.  A physical exam of your ears, nose, and throat.  Tests, such as those that measure: ? The movement of your eardrum (tympanogram). ? Your hearing (audiometry). How is this treated? Treatment depends on the cause and severity of your condition.  In mild cases, you may relieve your symptoms by moving air into your ears. This is called "popping the ears."  In more severe cases, or if you have symptoms of fluid in your ears, treatment may include: ? Medicines to relieve congestion (decongestants). ? Medicines that treat allergies (antihistamines). ? Nasal sprays or ear drops that contain medicines that reduce swelling (steroids). ? A procedure to drain the fluid in your eardrum (myringotomy). In this procedure, a small tube is placed in the eardrum to:  Drain the fluid.  Restore the air in the middle ear space. ? A procedure to insert a balloon device through the nose to inflate the opening of the eustachian tube (balloon dilation). Follow these instructions at home: Lifestyle  Do not do any of the following until your health care provider approves: ? Travel to high altitudes. ? Fly in airplanes. ? Work in a Pension scheme manager or room. ? Scuba dive.  Do  not use any products that contain nicotine or tobacco, such as cigarettes and e-cigarettes. If you need help quitting, ask your health care provider.  Keep your ears dry. Wear fitted earplugs during showering and bathing. Dry your ears completely after. General instructions  Take over-the-counter and prescription medicines only as told by your health care provider.  Use techniques to help pop your ears as recommended by your health care provider.  These may include: ? Chewing gum. ? Yawning. ? Frequent, forceful swallowing. ? Closing your mouth, holding your nose closed, and gently blowing as if you are trying to blow air out of your nose.  Keep all follow-up visits as told by your health care provider. This is important. Contact a health care provider if:  Your symptoms do not go away after treatment.  Your symptoms come back after treatment.  You are unable to pop your ears.  You have: ? A fever. ? Pain in your ear. ? Pain in your head or neck. ? Fluid draining from your ear.  Your hearing suddenly changes.  You become very dizzy.  You lose your balance. Summary  Eustachian tube dysfunction refers to a condition in which a blockage develops in the eustachian tube.  It can be caused by ear infections, allergies, inhaled irritants, or abnormal growths in the nose or throat.  Symptoms include ear pain, hearing loss, or ringing in the ears.  Mild cases are treated with maneuvers to unblock the ears, such as yawning or ear popping.  Severe cases are treated with medicines. Surgery may also be done (rare). This information is not intended to replace advice given to you by your health care provider. Make sure you discuss any questions you have with your health care provider. Document Released: 03/09/2015 Document Revised: 06/02/2017 Document Reviewed: 06/02/2017 Elsevier Patient Education  2020 [employer]

## 2018-11-11 NOTE — Assessment & Plan Note (Addendum)
-  Likely related to transient serous OM, appears to have resolved at this time.  -Continue flonase and cetirizine -I did send in antibiotic of augmentin to start if her symptoms worsen significantly again.

## 2018-11-15 ENCOUNTER — Other Ambulatory Visit: Payer: Self-pay | Admitting: Family Medicine

## 2018-12-01 ENCOUNTER — Telehealth: Payer: Self-pay

## 2018-12-01 NOTE — Telephone Encounter (Signed)

## 2018-12-02 ENCOUNTER — Ambulatory Visit (INDEPENDENT_AMBULATORY_CARE_PROVIDER_SITE_OTHER): Payer: Medicare Other | Admitting: Behavioral Health

## 2018-12-02 ENCOUNTER — Encounter: Payer: Self-pay | Admitting: Family Medicine

## 2018-12-02 DIAGNOSIS — Z23 Encounter for immunization: Secondary | ICD-10-CM | POA: Diagnosis not present

## 2018-12-02 NOTE — Progress Notes (Signed)
Patient presents in clinic today for Influenza vaccination. IM injection was given in the left deltoid. Patient tolerated the injection well. No signs or symptoms of a reaction were noted prior to patient leaving the nurse visit. 

## 2018-12-08 ENCOUNTER — Ambulatory Visit (INDEPENDENT_AMBULATORY_CARE_PROVIDER_SITE_OTHER): Payer: Medicare Other | Admitting: Behavioral Health

## 2018-12-08 DIAGNOSIS — M810 Age-related osteoporosis without current pathological fracture: Secondary | ICD-10-CM | POA: Diagnosis not present

## 2018-12-08 MED ORDER — DENOSUMAB 60 MG/ML ~~LOC~~ SOSY
60.0000 mg | PREFILLED_SYRINGE | Freq: Once | SUBCUTANEOUS | Status: AC
  Administered 2018-12-08: 10:00:00 60 mg via SUBCUTANEOUS

## 2018-12-08 NOTE — Progress Notes (Signed)
Patient presents in clinic today for Prolia injection. SQ injection was given in the left arm. Patient tolerated the injection well. No signs or symptoms of a reaction were noted prior to patient leaving the nurse visit. 

## 2018-12-09 NOTE — Progress Notes (Signed)
I reviewed health RN's note, was available for consultation, and agree with documentation and plan.  

## 2019-01-29 NOTE — Progress Notes (Signed)
Virtual Visit via Video   Due to the COVID-19 pandemic, this visit was completed with telemedicine (audio/video) technology to reduce patient and provider exposure as well as to preserve personal protective equipment.   I connected with Denise Parker by a video enabled telemedicine application and verified that I am speaking with the correct person using two identifiers. Location patient: Home Location provider: [facility], Office Persons participating in the virtual visit: Donnamarie Rossetti, MD   I discussed the limitations of evaluation and management by telemedicine and the availability of in person appointments. The patient expressed understanding and agreed to proceed.  Care Team   Patient Care Team: Lucille Passy, MD as PCP - General (Family Medicine) Philis Kendall, MD as Consulting Physician (Unknown Physician Specialty)  Subjective:   HPI: Pt agrees to virtual visit. She is wanting to F/U with current issues.  Her Mammogram is UTD next due on 7.27.21. She is due for her PNV-23. Cologuard will be due on 5.30.2021. She is scheduled for a lab visit on 12.8.20 and would like her Atorvastatin sent in after those come back. Would like to discuss if PNV-23 is needed as she has had 2 PNV vaccines.  HLD- currently taking lipitor 10 mg nightly.  Lab Results  Component Value Date   CHOL 196 07/09/2017   HDL 62.10 07/09/2017   LDLCALC 123 (H) 07/09/2017   LDLDIRECT 171.8 09/27/2012   TRIG 51.0 07/09/2017   CHOLHDL 3 07/09/2017   The 10-year ASCVD risk score [name], et al., 2013) is: 9.5%   Values used to calculate the score:     Age: 68 years     Sex: Female     Is Non-Hispanic African American: No     Diabetic: No     Tobacco smoker: No     Systolic Blood Pressure: 527 mmHg     Is BP treated: No     HDL Cholesterol: 62.1 mg/dL     Total Cholesterol: 196 mg/dL  Osteoporosis-  She recieves Prolia injections for her Osteoporosis.  Last dose  12/08/18. Last DEXA was 09/02/16.  ASSESSMENT: The BMD measured at Forearm Radius 33% is 0.580 g/cm2 with a T-score of -3.4.     Review of Systems  Constitutional: Negative for fever and malaise/fatigue.  HENT: Negative for congestion and hearing loss.   Eyes: Negative for blurred vision, discharge and redness.  Respiratory: Negative for cough and shortness of breath.   Cardiovascular: Negative for chest pain, palpitations and leg swelling.  Gastrointestinal: Negative for abdominal pain and heartburn.  Genitourinary: Negative for dysuria.  Musculoskeletal: Negative for falls.  Skin: Negative for rash.  Neurological: Negative for loss of consciousness and headaches.  Endo/Heme/Allergies: Does not bruise/bleed easily.  Psychiatric/Behavioral: Negative for depression.  All other systems reviewed and are negative.    Patient Active Problem List   Diagnosis Date Noted  . Allergic reaction 06/29/2018  . GERD (gastroesophageal reflux disease) 03/03/2017  . Trochanteric bursitis, right hip 10/21/2016  . Osteoporosis 09/09/2016  . Back pain 07/15/2016  . Decreased hearing 07/15/2016  . Myofascial pain syndrome 10/08/2015  . Arthritis 06/20/2015  . HLD (hyperlipidemia) 06/20/2015  . Herpes zoster 04/13/2013  . Metatarsalgia 09/27/2012  . Allergic rhinitis 06/16/2011    Social History   Tobacco Use  . Smoking status: Never Smoker  . Smokeless tobacco: Never Used  Substance Use Topics  . Alcohol use: Yes    Alcohol/week: 3.0 standard drinks    Types:  3 Glasses of wine per week    Comment: Wine-regular    Current Outpatient Medications:  .  atorvastatin (LIPITOR) 20 MG tablet, TAKE 1 TABLET BY MOUTH DAILY, Disp: 30 tablet, Rfl: 0 .  baclofen (LIORESAL) 10 MG tablet, Take 10 mg by mouth at bedtime as needed for muscle spasms. , Disp: , Rfl:  .  CALCIUM-VITAMIN D PO, Take 600 mg of calcium and 800 units of vitamin D daily, Disp: , Rfl:  .  cetirizine (ZYRTEC) 5 MG tablet,  TAKE 2 TABLETS(10 MG) BY MOUTH DAILY, Disp: 60 tablet, Rfl: 11 .  CRANBERRY-VITAMIN C PO, Take 1 tablet by mouth daily. , Disp: , Rfl:  .  denosumab (PROLIA) 60 MG/ML SOSY injection, Inject 60 mg into the skin every 6 (six) months., Disp: , Rfl:  .  docusate sodium (COLACE) 100 MG capsule, Take 200 mg by mouth daily as needed for mild constipation., Disp: , Rfl:  .  fluticasone (FLONASE) 50 MCG/ACT nasal spray, Place 1 spray into both nostrils daily., Disp: 16 g, Rfl: 6 .  GLUCOSAMINE CHONDROITIN COMPLX PO, Take 1 tablet by mouth daily. , Disp: , Rfl:  .  Menthol, Topical Analgesic, (BIOFREEZE EX), Apply 1 application topically daily as needed (bursitis in the hip and back pain)., Disp: , Rfl:  .  Naphazoline-Glycerin-Zinc Sulf (CLEAR EYES MAXIMUM ITCHY EYE OP), Place 2 drops into both eyes daily., Disp: , Rfl:  .  naproxen sodium (ALEVE) 220 MG tablet, Take 220 mg by mouth 2 (two) times daily as needed (pain). , Disp: , Rfl:  .  Turmeric 500 MG TABS, Take 1,000 mg by mouth. , Disp: , Rfl:  .  vitamin C (ASCORBIC ACID) 250 MG tablet, Take 250 mg by mouth daily., Disp: , Rfl:  .  vitamin E 400 UNIT capsule, Take 400 Units by mouth daily., Disp: , Rfl:   Allergies  Allergen Reactions  . Probiotic Product Itching and Swelling    Can't remember the name, it was a women's probiotic supplement. It cause burning, itching and swelling of the ears    Objective:  Wt 168 lb (76.2 kg)   BMI 29.76 kg/m   Wt Readings from Last 3 Encounters:  01/31/19 168 lb (76.2 kg)  11/11/18 164 lb 12.8 oz (74.8 kg)  09/11/17 155 lb (70.3 kg)    VITALS: Per patient if applicable, see vitals. GENERAL: Alert, appears well and in no acute distress. HEENT: Atraumatic, conjunctiva clear, no obvious abnormalities on inspection of  external nose and ears. NECK: Normal movements of the head and neck. CARDIOPULMONARY: No increased WOB. Speaking in clear sentences. I:E ratio WNL.  MS: Moves all visible extremities  without noticeable abnormality. PSYCH: Pleasant and cooperative, well-groomed. Speech normal rate and rhythm. Affect is appropriate. Insight and judgement are appropriate. Attention is focused, linear, and appropriate.  NEURO: CN grossly intact. Oriented as arrived to appointment on time with no prompting. Moves both UE equally.  SKIN: No obvious lesions, wounds, erythema, or cyanosis noted on face or hands.  Depression screen [facility] 2/9 01/31/2019 07/08/2017 07/15/2016  Decreased Interest 0 0 0  Down, Depressed, Hopeless 0 0 0  PHQ - 2 Score 0 0 0      . COVID-19 Education: The signs and symptoms of COVID-19 were discussed with the patient and how to seek care for testing if needed. The importance of social distancing was discussed today. . Reviewed expectations re: course of current medical issues. . Discussed self-management of symptoms. . Outlined  signs and symptoms indicating need for more acute intervention. . Patient verbalized understanding and all questions were answered. Marland Kitchen Health Maintenance issues including appropriate healthy diet, exercise, and smoking avoidance were discussed with patient. . See orders for this visit as documented in the electronic medical record.  Records requested if needed. Time spent: 25 minutes, of which >50% was spent in obtaining information about her symptoms, reviewing her previous labs, evaluations, and treatments, counseling her about her condition (please see the discussed topics above), and developing a plan to further investigate it; she had a number of questions which I addressed.   Lab Results  Component Value Date   WBC 5.8 06/14/2015   HGB 13.1 06/14/2015   HCT 39.5 06/14/2015   PLT 240.0 06/14/2015   GLUCOSE 89 07/09/2017   CHOL 196 07/09/2017   TRIG 51.0 07/09/2017   HDL 62.10 07/09/2017   LDLDIRECT 171.8 09/27/2012   LDLCALC 123 (H) 07/09/2017   ALT 12 07/09/2017   AST 12 07/09/2017   NA 142 07/09/2017   K 4.9 07/09/2017   CL 107  07/09/2017   CREATININE 0.70 07/09/2017   BUN 9 07/09/2017   CO2 28 07/09/2017   TSH 1.71 07/09/2017    Lab Results  Component Value Date   TSH 1.71 07/09/2017   Lab Results  Component Value Date   WBC 5.8 06/14/2015   HGB 13.1 06/14/2015   HCT 39.5 06/14/2015   MCV 84.1 06/14/2015   PLT 240.0 06/14/2015   Lab Results  Component Value Date   NA 142 07/09/2017   K 4.9 07/09/2017   CO2 28 07/09/2017   GLUCOSE 89 07/09/2017   BUN 9 07/09/2017   CREATININE 0.70 07/09/2017   BILITOT 0.5 07/09/2017   ALKPHOS 31 (L) 07/09/2017   AST 12 07/09/2017   ALT 12 07/09/2017   PROT 6.6 07/09/2017   ALBUMIN 4.1 07/09/2017   CALCIUM 9.1 07/09/2017   GFR 88.32 07/09/2017   Lab Results  Component Value Date   CHOL 196 07/09/2017   Lab Results  Component Value Date   HDL 62.10 07/09/2017   Lab Results  Component Value Date   LDLCALC 123 (H) 07/09/2017   Lab Results  Component Value Date   TRIG 51.0 07/09/2017   Lab Results  Component Value Date   CHOLHDL 3 07/09/2017   No results found for: HGBA1C     Assessment & Plan:   Problem List Items Addressed This Visit      Active Problems   Arthritis   HLD (hyperlipidemia)    Tolerating statin, encouraged heart healthy diet, avoid trans fats, minimize simple carbs and saturated fats. Increase exercise as tolerated Due for labs.  Orders Placed This Encounter  Procedures  . DG Bone Density  . Comp Met (CMET)  . CBC w/Diff  . TSH  . Lipid Profile  . Vitamin D (25 hydroxy)  . Calcium         Relevant Orders   Comp Met (CMET)   CBC w/Diff   TSH   Lipid Profile   Osteoporosis - Primary    She recieves Prolia injections for her Osteoporosis.  Last dose 12/08/18.  Last DEXA was 09/02/16.  DEXA ordered and phone number for [facility] given to pt to schedule her bone density.  Continue calcium/vitamin D and weight bearing exercises.           Relevant Orders   Vitamin D (25 hydroxy)   Calcium   DG Bone  Density  I have discontinued Denise Parker's (Flaxseed, Linseed, (FLAXSEED OIL PO)), Methylcellulose (Laxative), omeprazole, and amoxicillin-clavulanate. I am also having her maintain her CALCIUM-VITAMIN D PO, CRANBERRY-VITAMIN C PO, vitamin E, vitamin C, Turmeric, GLUCOSAMINE CHONDROITIN COMPLX PO, baclofen, naproxen sodium, denosumab, docusate sodium, Naphazoline-Glycerin-Zinc Sulf (CLEAR EYES MAXIMUM ITCHY EYE OP), (Menthol, Topical Analgesic, (BIOFREEZE EX)), atorvastatin, fluticasone, and cetirizine.  Meds ordered this encounter  Medications  . cetirizine (ZYRTEC) 5 MG tablet    Sig: TAKE 2 TABLETS(10 MG) BY MOUTH DAILY    Dispense:  60 tablet    Refill:  11     Arnette Norris, MD

## 2019-01-31 ENCOUNTER — Telehealth (INDEPENDENT_AMBULATORY_CARE_PROVIDER_SITE_OTHER): Payer: Medicare Other | Admitting: Family Medicine

## 2019-01-31 ENCOUNTER — Other Ambulatory Visit: Payer: Self-pay

## 2019-01-31 ENCOUNTER — Encounter: Payer: Self-pay | Admitting: Family Medicine

## 2019-01-31 VITALS — Wt 168.0 lb

## 2019-01-31 DIAGNOSIS — E785 Hyperlipidemia, unspecified: Secondary | ICD-10-CM

## 2019-01-31 DIAGNOSIS — M810 Age-related osteoporosis without current pathological fracture: Secondary | ICD-10-CM

## 2019-01-31 DIAGNOSIS — J301 Allergic rhinitis due to pollen: Secondary | ICD-10-CM | POA: Diagnosis not present

## 2019-01-31 DIAGNOSIS — M1990 Unspecified osteoarthritis, unspecified site: Secondary | ICD-10-CM

## 2019-01-31 MED ORDER — CETIRIZINE HCL 5 MG PO TABS: ORAL_TABLET | ORAL | 11 refills | Status: DC

## 2019-01-31 NOTE — Assessment & Plan Note (Signed)
Tolerating statin, encouraged heart healthy diet, avoid trans fats, minimize simple carbs and saturated fats. Increase exercise as tolerated Due for labs.  Orders Placed This Encounter  Procedures  . DG Bone Density  . Comp Met (CMET)  . CBC w/Diff  . TSH  . Lipid Profile  . Vitamin D (25 hydroxy)  . Calcium

## 2019-01-31 NOTE — Assessment & Plan Note (Signed)
Doing well on current antihistamine.

## 2019-01-31 NOTE — Assessment & Plan Note (Addendum)
She recieves Prolia injections for her Osteoporosis.  Last dose 12/08/18.  Last DEXA was 09/02/16.  DEXA ordered and phone number for [place] given to pt to schedule her bone density.  Continue calcium/vitamin D and weight bearing exercises.

## 2019-02-01 ENCOUNTER — Other Ambulatory Visit (INDEPENDENT_AMBULATORY_CARE_PROVIDER_SITE_OTHER): Payer: Medicare Other

## 2019-02-01 ENCOUNTER — Ambulatory Visit (INDEPENDENT_AMBULATORY_CARE_PROVIDER_SITE_OTHER): Payer: Medicare Other

## 2019-02-01 DIAGNOSIS — Z23 Encounter for immunization: Secondary | ICD-10-CM

## 2019-02-01 DIAGNOSIS — E785 Hyperlipidemia, unspecified: Secondary | ICD-10-CM

## 2019-02-01 DIAGNOSIS — M810 Age-related osteoporosis without current pathological fracture: Secondary | ICD-10-CM

## 2019-02-01 LAB — COMPREHENSIVE METABOLIC PANEL
ALT: 18 U/L (ref 0–35)
AST: 18 U/L (ref 0–37)
Albumin: 4.2 g/dL (ref 3.5–5.2)
Alkaline Phosphatase: 29 U/L — ABNORMAL LOW (ref 39–117)
BUN: 9 mg/dL (ref 6–23)
CO2: 30 mEq/L (ref 19–32)
Calcium: 8.3 mg/dL — ABNORMAL LOW (ref 8.4–10.5)
Chloride: 103 mEq/L (ref 96–112)
Creatinine, Ser: 0.55 mg/dL (ref 0.40–1.20)
GFR: 109.26 mL/min (ref >60.00)
Glucose, Bld: 88 mg/dL (ref 70–99)
Potassium: 4.3 mEq/L (ref 3.5–5.1)
Sodium: 138 mEq/L (ref 135–145)
Total Bilirubin: 0.5 mg/dL (ref 0.2–1.2)
Total Protein: 6.3 g/dL (ref 6.0–8.3)

## 2019-02-01 LAB — CBC WITH DIFFERENTIAL/PLATELET
Basophils Absolute: 0.1 10*3/uL (ref 0.0–0.1)
Basophils Relative: 1.2 % (ref 0.0–3.0)
Eosinophils Absolute: 0.1 10*3/uL (ref 0.0–0.7)
Eosinophils Relative: 2.8 % (ref 0.0–5.0)
HCT: 36.9 % (ref 36.0–46.0)
Hemoglobin: 12.1 g/dL (ref 12.0–15.0)
Lymphocytes Relative: 43.5 % (ref 12.0–46.0)
Lymphs Abs: 1.9 10*3/uL (ref 0.7–4.0)
MCHC: 32.7 g/dL (ref 30.0–36.0)
MCV: 86.2 fl (ref 78.0–100.0)
Monocytes Absolute: 0.4 10*3/uL (ref 0.1–1.0)
Monocytes Relative: 8.4 % (ref 3.0–12.0)
Neutro Abs: 1.9 10*3/uL (ref 1.4–7.7)
Neutrophils Relative %: 44.1 % (ref 43.0–77.0)
Platelets: 217 10*3/uL (ref 150.0–400.0)
RBC: 4.28 Mil/uL (ref 3.87–5.11)
RDW: 14.6 % (ref 11.5–15.5)
WBC: 4.3 10*3/uL (ref 4.0–10.5)

## 2019-02-01 LAB — TSH: TSH: 3.62 u[IU]/mL (ref 0.35–4.50)

## 2019-02-01 LAB — LIPID PANEL
Cholesterol: 218 mg/dL — ABNORMAL HIGH (ref 0–200)
HDL: 85.8 mg/dL (ref >39.00)
LDL Cholesterol: 122 mg/dL — ABNORMAL HIGH (ref 0–99)
NonHDL: 132.41
Total CHOL/HDL Ratio: 3
Triglycerides: 50 mg/dL (ref 0.0–149.0)
VLDL: 10 mg/dL (ref 0.0–40.0)

## 2019-02-01 LAB — VITAMIN D 25 HYDROXY (VIT D DEFICIENCY, FRACTURES): VITD: 25.94 ng/mL — ABNORMAL LOW (ref 30.00–100.00)

## 2019-02-01 LAB — CALCIUM: Calcium: 8.3 mg/dL — ABNORMAL LOW (ref 8.4–10.5)

## 2019-02-01 NOTE — Progress Notes (Signed)
Pt came into the office today to get pneumonia 23 injection, gave shot into the right deltoid, pt tolerated injection well.

## 2019-02-01 NOTE — Progress Notes (Signed)
Labs drawn/thx dmf

## 2019-02-02 MED ORDER — VITAMIN D (ERGOCALCIFEROL) 1.25 MG (50000 UNIT) PO CAPS: 50000.0000 [IU] | ORAL_CAPSULE | ORAL | 0 refills | Status: DC

## 2019-02-02 MED ORDER — ATORVASTATIN CALCIUM 20 MG PO TABS: 20.0000 mg | ORAL_TABLET | Freq: Every day | ORAL | 3 refills | Status: DC

## 2019-02-02 NOTE — Addendum Note (Signed)
Addended by: Darral Dash on: 02/02/2019 02:26 PM   Modules accepted: Orders

## 2019-02-08 ENCOUNTER — Ambulatory Visit
Admission: RE | Admit: 2019-02-08 | Discharge: 2019-02-08 | Disposition: A | Discharge status: Home / Self Care | Payer: Medicare Other | Source: Ambulatory Visit | Attending: Family Medicine | Admitting: Family Medicine

## 2019-02-08 DIAGNOSIS — Z780 Asymptomatic menopausal state: Secondary | ICD-10-CM | POA: Diagnosis not present

## 2019-02-08 DIAGNOSIS — M8588 Other specified disorders of bone density and structure, other site: Secondary | ICD-10-CM | POA: Diagnosis not present

## 2019-02-08 DIAGNOSIS — M810 Age-related osteoporosis without current pathological fracture: Secondary | ICD-10-CM | POA: Insufficient documentation

## 2019-04-08 ENCOUNTER — Telehealth: Payer: Self-pay

## 2019-04-08 DIAGNOSIS — E559 Vitamin D deficiency, unspecified: Secondary | ICD-10-CM

## 2019-04-08 NOTE — Telephone Encounter (Signed)
Denise Parker has a TOC visit scheduled with you in April/since Dr. Dayton Martes is no longer practicing I am having the ordered labs go to you for review to be certain you can address them with the patient/thx dmf

## 2019-04-08 NOTE — Telephone Encounter (Signed)
noted 

## 2019-04-10 ENCOUNTER — Ambulatory Visit: Payer: Medicare Other | Attending: Internal Medicine

## 2019-04-10 DIAGNOSIS — Z23 Encounter for immunization: Secondary | ICD-10-CM | POA: Insufficient documentation

## 2019-04-10 NOTE — Progress Notes (Signed)
   Covid-19 Vaccination Clinic  Name:  Denise Parker    MRN: [id number] DOB: Jun 28, 1950  04/10/2019  Denise Parker was observed post Covid-19 immunization for 15 minutes without incidence. She was provided with Vaccine Information Sheet and instruction to access the V-Safe system.   Denise Parker was instructed to call 911 with any severe reactions post vaccine: Marland Kitchen Difficulty breathing  . Swelling of your face and throat  . A fast heartbeat  . A bad rash all over your body  . Dizziness and weakness    Immunizations Administered    Name Date Dose VIS Date Route   Pfizer COVID-19 Vaccine 04/10/2019 11:24 AM 0.3 mL 02/04/2019 Intramuscular   Manufacturer: [employer], [employer]   Lot: [id number]   NDC: [address]-8

## 2019-04-11 ENCOUNTER — Other Ambulatory Visit (INDEPENDENT_AMBULATORY_CARE_PROVIDER_SITE_OTHER): Payer: Medicare Other

## 2019-04-11 ENCOUNTER — Encounter: Payer: Self-pay | Admitting: Family Medicine

## 2019-04-11 ENCOUNTER — Other Ambulatory Visit: Payer: Self-pay

## 2019-04-11 DIAGNOSIS — E559 Vitamin D deficiency, unspecified: Secondary | ICD-10-CM

## 2019-04-11 LAB — VITAMIN D 25 HYDROXY (VIT D DEFICIENCY, FRACTURES): VITD: 41.09 ng/mL (ref 30.00–100.00)

## 2019-04-18 ENCOUNTER — Telehealth: Payer: Self-pay

## 2019-04-18 NOTE — Telephone Encounter (Signed)
Pt has New Pt appt 05-30-19 w/Dr. Selena Batten.  I am working on submitting benefits and any auth if required.  Pt had decreased calcium lab 02-01-19 (8.3).

## 2019-04-18 NOTE — Telephone Encounter (Signed)
Ok, appears last dose was in October. Will likely be due near appointment time.

## 2019-05-10 ENCOUNTER — Ambulatory Visit: Payer: Medicare Other | Attending: Internal Medicine

## 2019-05-10 DIAGNOSIS — Z23 Encounter for immunization: Secondary | ICD-10-CM

## 2019-05-10 NOTE — Progress Notes (Signed)
   Covid-19 Vaccination Clinic  Name:  Denise Parker    MRN: [id number] DOB: Jun 17, 1950  05/10/2019  [name] was observed post Covid-19 immunization for 15 minutes without incident. She was provided with Vaccine Information Sheet and instruction to access the V-Safe system.   [name] was instructed to call 911 with any severe reactions post vaccine: Marland Kitchen Difficulty breathing  . Swelling of face and throat  . A fast heartbeat  . A bad rash all over body  . Dizziness and weakness   Immunizations Administered    Name Date Dose VIS Date Route   Pfizer COVID-19 Vaccine 05/10/2019 11:23 AM 0.3 mL 02/04/2019 Intramuscular   Manufacturer: [employer], [employer]   Lot: [id number]   NDC: [address]-3

## 2019-05-18 ENCOUNTER — Telehealth: Payer: Self-pay

## 2019-05-18 NOTE — Telephone Encounter (Signed)
Advised pt of cost. She reports her pharmacy coverage, [employer], will cover it at no cost and no auth needed. The number to call for [employer] to verify if Berkley Harvey is needed is [phone]. The injection will be mailed to the pt and she will bring it with her when she is scheduled for her injectioin. Advised pt she would also need labs and she could get those at the NP apt on 4/5. Pt agreed and verbalized understanding.

## 2019-05-18 NOTE — Telephone Encounter (Signed)
Pt will be NP 05-30-19 w/Dr. Selena Batten  (Transfer from Dr. Dayton Martes). Pt's Prolia due 06-09-19.  Pt has MCR and 2 [employer].  Prior auth for both [employer] done.  [id number]#[id number] valid 05/20/19 - 05-19-20.  [id number] [id number]#[id number] valid 04/19/19 - 2/225/22.  Pt will need to meet deductible for [facility] and [employer].  Pt would owe approx. $380 after insurance (deductible +4% co insurance).  Her next injection she would owe approx. $50 as deductibles will have already been satisfied.

## 2019-05-18 NOTE — Telephone Encounter (Signed)
Pt has NP apt with Dr. Selena Batten on 4/5 so she will not be able to get new labs or the injection until she establishes care with her. But she is not due for injection until 4/15. LVM to discuss copay and labs and injection with pt.

## 2019-05-30 ENCOUNTER — Ambulatory Visit (INDEPENDENT_AMBULATORY_CARE_PROVIDER_SITE_OTHER): Payer: Medicare Other | Admitting: Family Medicine

## 2019-05-30 ENCOUNTER — Encounter: Payer: Self-pay | Admitting: Family Medicine

## 2019-05-30 ENCOUNTER — Other Ambulatory Visit: Payer: Self-pay

## 2019-05-30 VITALS — BP 128/78 | HR 58 | Temp 97.6°F | Ht 63.75 in | Wt 180.0 lb

## 2019-05-30 DIAGNOSIS — Z789 Other specified health status: Secondary | ICD-10-CM | POA: Diagnosis not present

## 2019-05-30 DIAGNOSIS — K2950 Unspecified chronic gastritis without bleeding: Secondary | ICD-10-CM | POA: Diagnosis not present

## 2019-05-30 DIAGNOSIS — M810 Age-related osteoporosis without current pathological fracture: Secondary | ICD-10-CM | POA: Diagnosis not present

## 2019-05-30 DIAGNOSIS — E785 Hyperlipidemia, unspecified: Secondary | ICD-10-CM

## 2019-05-30 DIAGNOSIS — F1090 Alcohol use, unspecified, uncomplicated: Secondary | ICD-10-CM

## 2019-05-30 DIAGNOSIS — G2581 Restless legs syndrome: Secondary | ICD-10-CM

## 2019-05-30 DIAGNOSIS — M25551 Pain in right hip: Secondary | ICD-10-CM

## 2019-05-30 DIAGNOSIS — K219 Gastro-esophageal reflux disease without esophagitis: Secondary | ICD-10-CM | POA: Diagnosis not present

## 2019-05-30 HISTORY — DX: Unspecified chronic gastritis without bleeding: K29.50

## 2019-05-30 LAB — COMPREHENSIVE METABOLIC PANEL
ALT: 15 U/L (ref 0–35)
AST: 18 U/L (ref 0–37)
Albumin: 4.3 g/dL (ref 3.5–5.2)
Alkaline Phosphatase: 32 U/L — ABNORMAL LOW (ref 39–117)
BUN: 10 mg/dL (ref 6–23)
CO2: 29 mEq/L (ref 19–32)
Calcium: 8.9 mg/dL (ref 8.4–10.5)
Chloride: 105 mEq/L (ref 96–112)
Creatinine, Ser: 0.65 mg/dL (ref 0.40–1.20)
GFR: 90.02 mL/min (ref >60.00)
Glucose, Bld: 101 mg/dL — ABNORMAL HIGH (ref 70–99)
Potassium: 4.1 mEq/L (ref 3.5–5.1)
Sodium: 140 mEq/L (ref 135–145)
Total Bilirubin: 0.6 mg/dL (ref 0.2–1.2)
Total Protein: 6.9 g/dL (ref 6.0–8.3)

## 2019-05-30 LAB — FERRITIN: Ferritin: 46.8 ng/mL (ref 10.0–291.0)

## 2019-05-30 MED ORDER — DENOSUMAB 60 MG/ML ~~LOC~~ SOSY: 60.0000 mg | PREFILLED_SYRINGE | SUBCUTANEOUS | 0 refills | Status: DC

## 2019-05-30 MED ORDER — ATORVASTATIN CALCIUM 20 MG PO TABS: 20.0000 mg | ORAL_TABLET | Freq: Every day | ORAL | 3 refills | Status: DC

## 2019-05-30 NOTE — Patient Instructions (Addendum)
Great to meet you today!  1) Hip pain - physical therapy - For pain in general - try changing your Tumeric dosing to below - try to reduce aleve to as needed  Curcumin or Tumeric  Research has shown that Curcumin (Tumeric) supplements are just as good as high dose anti-inflammatory medications (like diclofenac and ibuprofen) at treating pain from osteoarthritis without the side effects.   Take Curcumin 500 mg Three times daily   -- You can find a bottle at [employer] for $8 which should last a month -- [place] Brand is around $9 and is available at [facility]  2) Lower leg symptoms - labs today - stretching before bed  3) Reflux - try to reduce Wine - try to reduce coffee - try to reduce Aleve use - Can try over the counter Famotidine as needed if symptoms persisting. Would try to not take daily - return if no improvement in 4-6 weeks  4) Osteoporosis - ordered prolia - labs today

## 2019-05-30 NOTE — Assessment & Plan Note (Signed)
Recent use with covid. 3-4 glasses of wine daily. Discussed daily limit and impact on reflux symptoms. Pt already planning to work to cutting back and this was encouraged.

## 2019-05-30 NOTE — Assessment & Plan Note (Signed)
Pt with significant glute tenderness and lateral hip TTP. Discussed trial of PT. Referral placed. Return if not improving

## 2019-05-30 NOTE — Progress Notes (Signed)
Subjective:     Denise Parker is a 69 y.o. female presenting for Transfer of Care (From Dr Selena Batten), Osteoporosis (Needs rx for Prolia sent to [facility]), Gastroesophageal Reflux (Has been having issues recently), and Leg Issues At Night (When going to bed, her right leg "has to constantly move". Has issues with right hip. Wondes if it is related to that. )     HPI  #Ostoeporosis - Prolia is due April 15 - the last 2 times it was late due to billing issues - needs it sent to [facility] - taking Vit D - was on high dose replacement and calcium  - has been taking 2 tablets of Ca + Vit D    #Reflux - initially started when taking her previous osteoporosis medication - also drinking 3-4 glasses of wine daily - was getting severe CP, and bubbling - for a while symptoms were improved, but it is back and feeling worse now - drinks coffee in the morning - and it triggers the symptoms  #Leg symptoms - having some trouble with the glute area of the right leg - saw sports medicine and told she may have some glute muscle issue into the back of the thigh - has to keep the leg stretched out at night - when contracted she has weird symptoms - difficulty getting comfortable at night - urge to move the leg -    Review of Systems   Social History   Tobacco Use  Smoking Status Never Smoker  Smokeless Tobacco Never Used        Objective:    BP Readings from Last 3 Encounters:  05/30/19 128/78  11/11/18 130/70  09/11/17 98/63   Wt Readings from Last 3 Encounters:  05/30/19 180 lb (81.6 kg)  01/31/19 168 lb (76.2 kg)  11/11/18 164 lb 12.8 oz (74.8 kg)    BP 128/78 (BP Location: Right Arm, Patient Position: Sitting, Cuff Size: Large)   Pulse (!) 58   Temp 97.6 F (36.4 C)   Ht 5' 3.75" (1.619 m)   Wt 180 lb (81.6 kg)   SpO2 100%   BMI 31.14 kg/m    Physical Exam Constitutional:      General: She is not in acute distress.    Appearance: She is well-developed. She is  not diaphoretic.  HENT:     Right Ear: External ear normal.     Left Ear: External ear normal.     Nose: Nose normal.  Eyes:     Conjunctiva/sclera: Conjunctivae normal.  Cardiovascular:     Rate and Rhythm: Normal rate and regular rhythm.     Heart sounds: No murmur.  Pulmonary:     Effort: Pulmonary effort is normal. No respiratory distress.     Breath sounds: Normal breath sounds.  Musculoskeletal:     Cervical back: Neck supple.     Comments: Right Leg:  Normal foot strength. No calf tenderness. No abnormality Right hip Normal strength TTP along the glute and the lateral hip  Skin:    General: Skin is warm and dry.     Capillary Refill: Capillary refill takes less than 2 seconds.  Neurological:     Mental Status: She is alert. Mental status is at baseline.  Psychiatric:        Mood and Affect: Mood normal.        Behavior: Behavior normal.           Assessment & Plan:   Problem List Items Addressed  This Visit      Digestive   GERD (gastroesophageal reflux disease)   Relevant Orders   Ferritin   Chronic gastritis without bleeding    Pt with significant reflux symptoms. Discussed triggers - alcohol, coffee, NSAIDs. She will try tumeric for pain. She will cut back on alcohol and coffee. Can try famotidine if no improvement with limiting triggers.       Relevant Orders   Ferritin     Musculoskeletal and Integument   Osteoporosis - Primary    Discussed avoiding PPI for GERD due to osteoporosis. Labs today prior to next prolia injection      Relevant Medications   Calcium Carbonate-Vit D-Min (CALTRATE 600+D PLUS MINERALS) 600-800 MG-UNIT TABS   denosumab (PROLIA) 60 MG/ML SOSY injection   Other Relevant Orders   Comprehensive metabolic panel     Other   HLD (hyperlipidemia)    Cont atorvastatin      Relevant Medications   atorvastatin (LIPITOR) 20 MG tablet   Right hip pain    Pt with significant glute tenderness and lateral hip TTP. Discussed trial  of PT. Referral placed. Return if not improving      Relevant Orders   Ambulatory referral to Physical Therapy   Heavy alcohol use    Recent use with covid. 3-4 glasses of wine daily. Discussed daily limit and impact on reflux symptoms. Pt already planning to work to cutting back and this was encouraged.       Restless legs    Lower leg symptoms seems most consistent with restless legs. Advised stretching at night and will get some labs today.       Relevant Orders   Ferritin       Return in about 6 months (around 11/29/2019).  Lesleigh Noe, MD

## 2019-05-30 NOTE — Assessment & Plan Note (Signed)
Pt with significant reflux symptoms. Discussed triggers - alcohol, coffee, NSAIDs. She will try tumeric for pain. She will cut back on alcohol and coffee. Can try famotidine if no improvement with limiting triggers.

## 2019-05-30 NOTE — Assessment & Plan Note (Signed)
Cont atorvastatin  

## 2019-05-30 NOTE — Assessment & Plan Note (Signed)
Discussed avoiding PPI for GERD due to osteoporosis. Labs today prior to next prolia injection

## 2019-05-30 NOTE — Assessment & Plan Note (Signed)
Lower leg symptoms seems most consistent with restless legs. Advised stretching at night and will get some labs today.

## 2019-05-31 MED ORDER — DENOSUMAB 60 MG/ML ~~LOC~~ SOSY: 60.0000 mg | PREFILLED_SYRINGE | SUBCUTANEOUS | 0 refills | Status: DC

## 2019-05-31 NOTE — Telephone Encounter (Signed)
Per Hardin Negus, [facility] PA Dept, [phone], Call [phone] no PA rqd for Prolia shipped from [facility].  Pt would have $0 copay.  Per Jonny Ruiz, [facility] Pharmacist,[phone], they prefer to ship to [facility] office.  (Pt needs to call and consent for shipping.)  [facility] has verified our address and office hours/days.  Spoke w/pt.  She will call [phone] to give consent.  Once consented they will ship and should arrive 06-03-19.

## 2019-05-31 NOTE — Telephone Encounter (Signed)
Ca is in normal range and CrCl is 103.20mL/min. Pt is ok to get Prolia injection. Charmaine has placed order to pt's pharmacy and they are to mail it to this office. It may come on 4/9. Will f/u with pt after prolia has been received.

## 2019-05-31 NOTE — Addendum Note (Signed)
Addended by: Consuella Lose on: 05/31/2019 08:14 AM   Modules accepted: Orders

## 2019-06-03 NOTE — Telephone Encounter (Signed)
Prolia was received. Placed in refrigerator with pt's name and note to not charge pt for med when she get injection. Contacted pt and schedule for NV on 4/15. Pt verbalized understanding.

## 2019-06-09 ENCOUNTER — Ambulatory Visit (INDEPENDENT_AMBULATORY_CARE_PROVIDER_SITE_OTHER): Payer: Medicare Other

## 2019-06-09 ENCOUNTER — Other Ambulatory Visit: Payer: Self-pay

## 2019-06-09 DIAGNOSIS — M810 Age-related osteoporosis without current pathological fracture: Secondary | ICD-10-CM | POA: Diagnosis not present

## 2019-06-09 DIAGNOSIS — M25551 Pain in right hip: Secondary | ICD-10-CM | POA: Diagnosis not present

## 2019-06-09 DIAGNOSIS — R262 Difficulty in walking, not elsewhere classified: Secondary | ICD-10-CM | POA: Diagnosis not present

## 2019-06-09 MED ORDER — DENOSUMAB 60 MG/ML ~~LOC~~ SOSY
60.0000 mg | PREFILLED_SYRINGE | Freq: Once | SUBCUTANEOUS | Status: AC
  Administered 2019-06-09: 60 mg via SUBCUTANEOUS

## 2019-06-10 NOTE — Progress Notes (Signed)
Injection appropriate.

## 2019-06-13 DIAGNOSIS — M25551 Pain in right hip: Secondary | ICD-10-CM | POA: Diagnosis not present

## 2019-06-13 DIAGNOSIS — R262 Difficulty in walking, not elsewhere classified: Secondary | ICD-10-CM | POA: Diagnosis not present

## 2019-06-15 DIAGNOSIS — M25551 Pain in right hip: Secondary | ICD-10-CM | POA: Diagnosis not present

## 2019-06-15 DIAGNOSIS — R262 Difficulty in walking, not elsewhere classified: Secondary | ICD-10-CM | POA: Diagnosis not present

## 2019-06-20 DIAGNOSIS — R262 Difficulty in walking, not elsewhere classified: Secondary | ICD-10-CM | POA: Diagnosis not present

## 2019-06-20 DIAGNOSIS — M25551 Pain in right hip: Secondary | ICD-10-CM | POA: Diagnosis not present

## 2019-06-22 DIAGNOSIS — R262 Difficulty in walking, not elsewhere classified: Secondary | ICD-10-CM | POA: Diagnosis not present

## 2019-06-22 DIAGNOSIS — M25551 Pain in right hip: Secondary | ICD-10-CM | POA: Diagnosis not present

## 2019-06-27 DIAGNOSIS — R262 Difficulty in walking, not elsewhere classified: Secondary | ICD-10-CM | POA: Diagnosis not present

## 2019-06-27 DIAGNOSIS — M25551 Pain in right hip: Secondary | ICD-10-CM | POA: Diagnosis not present

## 2019-07-01 DIAGNOSIS — R262 Difficulty in walking, not elsewhere classified: Secondary | ICD-10-CM | POA: Diagnosis not present

## 2019-07-01 DIAGNOSIS — M25551 Pain in right hip: Secondary | ICD-10-CM | POA: Diagnosis not present

## 2019-07-07 DIAGNOSIS — M25551 Pain in right hip: Secondary | ICD-10-CM | POA: Diagnosis not present

## 2019-07-07 DIAGNOSIS — R262 Difficulty in walking, not elsewhere classified: Secondary | ICD-10-CM | POA: Diagnosis not present

## 2019-07-14 DIAGNOSIS — R262 Difficulty in walking, not elsewhere classified: Secondary | ICD-10-CM | POA: Diagnosis not present

## 2019-07-14 DIAGNOSIS — M25551 Pain in right hip: Secondary | ICD-10-CM | POA: Diagnosis not present

## 2019-07-19 DIAGNOSIS — R262 Difficulty in walking, not elsewhere classified: Secondary | ICD-10-CM | POA: Diagnosis not present

## 2019-07-19 DIAGNOSIS — M25551 Pain in right hip: Secondary | ICD-10-CM | POA: Diagnosis not present

## 2019-07-21 DIAGNOSIS — R262 Difficulty in walking, not elsewhere classified: Secondary | ICD-10-CM | POA: Diagnosis not present

## 2019-07-21 DIAGNOSIS — M25551 Pain in right hip: Secondary | ICD-10-CM | POA: Diagnosis not present

## 2019-07-27 DIAGNOSIS — R262 Difficulty in walking, not elsewhere classified: Secondary | ICD-10-CM | POA: Diagnosis not present

## 2019-07-27 DIAGNOSIS — Z0279 Encounter for issue of other medical certificate: Secondary | ICD-10-CM

## 2019-07-27 DIAGNOSIS — M25551 Pain in right hip: Secondary | ICD-10-CM | POA: Diagnosis not present

## 2019-07-29 DIAGNOSIS — M25551 Pain in right hip: Secondary | ICD-10-CM | POA: Diagnosis not present

## 2019-07-29 DIAGNOSIS — R262 Difficulty in walking, not elsewhere classified: Secondary | ICD-10-CM | POA: Diagnosis not present

## 2019-08-01 ENCOUNTER — Telehealth: Payer: Self-pay | Admitting: Family Medicine

## 2019-08-01 DIAGNOSIS — R262 Difficulty in walking, not elsewhere classified: Secondary | ICD-10-CM | POA: Diagnosis not present

## 2019-08-01 DIAGNOSIS — M25551 Pain in right hip: Secondary | ICD-10-CM | POA: Diagnosis not present

## 2019-08-01 NOTE — Telephone Encounter (Signed)
Patient called in regards to paperwork she dropped off She stated she received a call that it was ready. I did not see it up front in folders She wanted to know [facility] or does she need to pick this up    Please advise

## 2019-08-02 NOTE — Telephone Encounter (Deleted)
activities as tolerated 

## 2019-08-03 NOTE — Telephone Encounter (Signed)
Form faxed on 07/27/19 and again on 08/03/19.  Copy left up front for patient pick up.

## 2019-08-05 DIAGNOSIS — M25551 Pain in right hip: Secondary | ICD-10-CM | POA: Diagnosis not present

## 2019-08-05 DIAGNOSIS — R262 Difficulty in walking, not elsewhere classified: Secondary | ICD-10-CM | POA: Diagnosis not present

## 2019-08-09 DIAGNOSIS — R262 Difficulty in walking, not elsewhere classified: Secondary | ICD-10-CM | POA: Diagnosis not present

## 2019-08-09 DIAGNOSIS — M25551 Pain in right hip: Secondary | ICD-10-CM | POA: Diagnosis not present

## 2019-08-11 DIAGNOSIS — R262 Difficulty in walking, not elsewhere classified: Secondary | ICD-10-CM | POA: Diagnosis not present

## 2019-08-11 DIAGNOSIS — M25551 Pain in right hip: Secondary | ICD-10-CM | POA: Diagnosis not present

## 2019-09-05 ENCOUNTER — Encounter: Payer: Self-pay | Admitting: Family Medicine

## 2019-09-05 ENCOUNTER — Other Ambulatory Visit: Payer: Self-pay | Admitting: Family Medicine

## 2019-09-05 DIAGNOSIS — Z1231 Encounter for screening mammogram for malignant neoplasm of breast: Secondary | ICD-10-CM

## 2019-09-19 ENCOUNTER — Encounter: Payer: Self-pay | Admitting: Family Medicine

## 2019-09-19 ENCOUNTER — Other Ambulatory Visit: Payer: Self-pay

## 2019-09-19 MED ORDER — FLUTICASONE PROPIONATE 50 MCG/ACT NA SUSP: 1.0000 | Freq: Every day | NASAL | 6 refills | Status: DC

## 2019-10-05 ENCOUNTER — Ambulatory Visit
Admission: RE | Admit: 2019-10-05 | Discharge: 2019-10-05 | Disposition: A | Discharge status: Home / Self Care | Payer: Medicare Other | Source: Ambulatory Visit | Attending: Family Medicine | Admitting: Family Medicine

## 2019-10-05 ENCOUNTER — Other Ambulatory Visit: Payer: Self-pay

## 2019-10-05 DIAGNOSIS — Z1231 Encounter for screening mammogram for malignant neoplasm of breast: Secondary | ICD-10-CM | POA: Insufficient documentation

## 2019-11-04 ENCOUNTER — Ambulatory Visit (INDEPENDENT_AMBULATORY_CARE_PROVIDER_SITE_OTHER): Payer: Medicare Other | Admitting: Internal Medicine

## 2019-11-04 ENCOUNTER — Encounter: Payer: Self-pay | Admitting: Internal Medicine

## 2019-11-04 ENCOUNTER — Other Ambulatory Visit: Payer: Self-pay

## 2019-11-04 DIAGNOSIS — H1013 Acute atopic conjunctivitis, bilateral: Secondary | ICD-10-CM

## 2019-11-04 DIAGNOSIS — H1010 Acute atopic conjunctivitis, unspecified eye: Secondary | ICD-10-CM | POA: Insufficient documentation

## 2019-11-04 NOTE — Patient Instructions (Signed)
Please restart the cetirizine every day and continue the flonase. You can try over the counter eye drops as well if needed (like naphcon). If your ear is not improving, I can prescribe 3 days of prednisone to help open it up.

## 2019-11-04 NOTE — Progress Notes (Signed)
Subjective:    Patient ID: Denise Parker, female    DOB: Aug 31, 1950, 69 y.o.   MRN: [id number]  HPI Here due to eye problems This visit occurred during the SARS-CoV-2 public health emergency.  Safety protocols were in place, including screening questions prior to the visit, additional usage of staff PPE, and extensive cleaning of exam room while observing appropriate contact time as indicated for disinfecting solutions.   Eyes bothering her for a couple of weeks Awakens with them stuck together Red and irritated--especially on the left Using OTC drops for redness  Now with ear problems---itchy and feels like there is fluid Started more aching 2 days ago Trying to get it to pop but it wouldn't---thinks there is fluid Tried Q-tip with alcohol---then it did pop yesterday (and the pain improved but didn't go away) Still too uncomfortable to wear hearing aides  Hasn't been taking cetirizine---hasn't had her typical allergy symptoms like post nasal drip and nasal congestion Is taking the flonase daily  Current Outpatient Medications on File Prior to Visit  Medication Sig Dispense Refill  . atorvastatin (LIPITOR) 20 MG tablet Take 1 tablet (20 mg total) by mouth daily. 90 tablet 3  . baclofen (LIORESAL) 10 MG tablet Take 10 mg by mouth at bedtime as needed for muscle spasms.     . Calcium Carbonate-Vit D-Min (CALTRATE 600+D PLUS MINERALS) 600-800 MG-UNIT TABS Take 2 tablets by mouth.    . cetirizine (ZYRTEC) 5 MG tablet TAKE 2 TABLETS(10 MG) BY MOUTH DAILY 60 tablet 11  . CRANBERRY-VITAMIN C PO Take 1 tablet by mouth daily.     Marland Kitchen denosumab (PROLIA) 60 MG/ML SOSY injection Inject 60 mg into the skin every 6 (six) months. 2 mL 0  . docusate sodium (COLACE) 100 MG capsule Take 200 mg by mouth daily as needed for mild constipation.    . fluticasone (FLONASE) 50 MCG/ACT nasal spray Place 1 spray into both nostrils daily. 16 g 6  . GLUCOSAMINE CHONDROITIN COMPLX PO Take 1 tablet by mouth  daily.     Marland Kitchen MELATONIN PO Take by mouth.    . Menthol, Topical Analgesic, (BIOFREEZE EX) Apply 1 application topically daily as needed (bursitis in the hip and back pain).    . naproxen sodium (ALEVE) 220 MG tablet Take 220 mg by mouth 2 (two) times daily as needed (pain).     . Turmeric 500 MG TABS Take 5,000 mg by mouth.     . vitamin E 400 UNIT capsule Take 400 Units by mouth daily.     No current facility-administered medications on file prior to visit.    Allergies  Allergen Reactions  . Probiotic Product Itching and Swelling    Can't remember the name, it was a women's probiotic supplement. It cause burning, itching and swelling of the ears    Past Medical History:  Diagnosis Date  . Arthritis   . Bursitis    back  . HLD (hyperlipidemia)   . HOH (hard of hearing)     Past Surgical History:  Procedure Laterality Date  . BREAST CYST ASPIRATION Left 90s   benign  . CATARACT EXTRACTION W/PHACO Left 03/26/2017   Procedure: CATARACT EXTRACTION PHACO AND INTRAOCULAR LENS PLACEMENT (IOC);  Surgeon: Nevada Crane, MD;  Location: [facility] OR[facility];  Service: Ophthalmology;  Laterality: Left;   fluid pack lot #   [id number]    00:23.1 AP%  7.5 CDE   1.71   . CATARACT EXTRACTION W/PHACO Right 07/30/2017  Procedure: CATARACT EXTRACTION PHACO AND INTRAOCULAR LENS PLACEMENT (IOC);  Surgeon: Nevada Crane, MD;  Location: [facility] OR[facility];  Service: Ophthalmology;  Laterality: Right;  fluid pack lot #  [id number]  Exp  03/26/2018 [place]   00:32.1 AP%   5.9 CDE   1.81  . COLONOSCOPY WITH PROPOFOL N/A 09/11/2017   Procedure: COLONOSCOPY WITH PROPOFOL;  Surgeon: Pasty Spillers, MD;  Location: [facility];  Service: Gastroenterology;  Laterality: N/A;  . TONSILLECTOMY    . TUBAL LIGATION      Family History  Problem Relation Age of Onset  . Heart disease Mother   . Heart disease Father   . Breast cancer Sister 96  . Leukemia Sister   . Heart attack Brother 45  . Colon cancer Sister 66    . Lupus Sister   . Breast cancer Sister   . Lupus Sister     Social History   Socioeconomic History  . Marital status: Married    Spouse name: Denise Parker  . Number of children: 1  . 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 Highest education level: Not on file  Occupational History  . Not on file  Tobacco Use  . Smoking status: Never Smoker  . Smokeless tobacco: Never Used  Substance and Sexual Activity  . Alcohol use: Yes    Alcohol/week: 21.0 standard drinks    Types: 21 Glasses of wine per week    Comment: Wine-regular  . Drug use: No  . Sexual activity: Yes    Birth control/protection: Surgical, Post-menopausal  Other Topics Concern  . Not on file  Social History Narrative      Does not have a living will or HPOA   Desires CPR, would not want prolonged life support if futile.      05/30/19   From: originally from this area, but spent time in [place]   Living: with husband Denise Parker, and son and his family live upstairs   Work: retired from [employer], also use to teach [employer]      Family: Son - Denise Parker, has 5 grandchildren (2 grown in [place]) and 3 live here      Enjoys: reading, listen to books on tape, walk, hike, volunteering at the senior center      Exercise: walking - 5-7 times a week, 4 miles per day   Diet: pretty good, limits pork and beef, fish once a week      Safety   Seat belts: Yes    Guns: Yes  and secure   Safe in relationships: Yes       Social Determinants of Health   Financial Resource Strain:   . Difficulty of Paying Living Expenses: Not on file  Food Insecurity:   . Worried About Programme researcher, broadcasting/film/video in the Last Year: Not on file  . Ran Out of Food in the Last Year: Not on file  Transportation Needs:   . Lack of Transportation (Medical): Not on file  . Lack of Transportation (Non-Medical): Not on file  Physical Activity:   . Days of Exercise per Week: Not on file  . Minutes of Exercise per Session: Not on file  Stress:   .  Feeling of Stress : Not on file  Social Connections:   . Frequency of Communication with Friends and Family: Not on file  . Frequency of Social Gatherings with Friends and Family: Not on file  . Attends Religious Services: Not on file  . Active Member of  Clubs or Organizations: Not on file  . Attends Banker Meetings: Not on file  . Marital Status: Not on file  Intimate Partner Violence:   . Fear of Current or Ex-Partner: Not on file  . Emotionally Abused: Not on file  . Physically Abused: Not on file  . Sexually Abused: Not on file   Review of Systems No fever Not feeling sick    Objective:   Physical Exam Constitutional:      Appearance: Normal appearance.  HENT:     Ears:     Comments: Both canals normal No tragal tenderness Mild retraction of left TM without inflammation    Nose:     Comments: Mild nasal congestion Eyes:     Comments: Mild conjunctival injection--mostly on right  Neurological:     Mental Status: She is alert.            Assessment & Plan:

## 2019-11-04 NOTE — Assessment & Plan Note (Signed)
And serous otitis as well Discussed restarting the cetirizine daily Continue the flonase Use allergy eye drops (like naphcon) If ear is not improving--would try 3 days of prednisone (she will let me know)

## 2019-11-14 ENCOUNTER — Telehealth: Payer: Self-pay

## 2019-11-14 NOTE — Telephone Encounter (Signed)
Last Prolia given was 06/09/19. Next prolia due after 12/10/19 PA for specialty pharmacy is dated 04/19/19 to 04/17/20 and service code is [phone]. Prolia has already been mailed to clinic by pharmacy

## 2019-11-28 ENCOUNTER — Other Ambulatory Visit: Payer: Self-pay

## 2019-11-28 ENCOUNTER — Other Ambulatory Visit: Payer: 59

## 2019-11-28 DIAGNOSIS — M810 Age-related osteoporosis without current pathological fracture: Secondary | ICD-10-CM

## 2019-11-30 ENCOUNTER — Other Ambulatory Visit: Payer: Self-pay

## 2019-11-30 ENCOUNTER — Ambulatory Visit (INDEPENDENT_AMBULATORY_CARE_PROVIDER_SITE_OTHER): Payer: Medicare Other

## 2019-11-30 DIAGNOSIS — Z23 Encounter for immunization: Secondary | ICD-10-CM

## 2019-11-30 NOTE — Telephone Encounter (Signed)
Pt has been scheduled for lab on 12/12/19 and inj on 12/20/19. Medication has already been mailed into clinic and is in frig.

## 2019-12-01 ENCOUNTER — Ambulatory Visit: Payer: 59

## 2019-12-07 DIAGNOSIS — Z23 Encounter for immunization: Secondary | ICD-10-CM | POA: Diagnosis not present

## 2019-12-12 ENCOUNTER — Other Ambulatory Visit (INDEPENDENT_AMBULATORY_CARE_PROVIDER_SITE_OTHER): Payer: Medicare Other

## 2019-12-12 ENCOUNTER — Other Ambulatory Visit: Payer: Self-pay

## 2019-12-12 DIAGNOSIS — M810 Age-related osteoporosis without current pathological fracture: Secondary | ICD-10-CM | POA: Diagnosis not present

## 2019-12-13 LAB — BASIC METABOLIC PANEL
BUN: 16 mg/dL (ref 6–23)
CO2: 28 mEq/L (ref 19–32)
Calcium: 8.9 mg/dL (ref 8.4–10.5)
Chloride: 102 mEq/L (ref 96–112)
Creatinine, Ser: 0.71 mg/dL (ref 0.40–1.20)
GFR: 85.75 mL/min (ref >60.00)
Glucose, Bld: 119 mg/dL — ABNORMAL HIGH (ref 70–99)
Potassium: 4 mEq/L (ref 3.5–5.1)
Sodium: 137 mEq/L (ref 135–145)

## 2019-12-16 NOTE — Telephone Encounter (Signed)
Labs normal, CrCl is 96.47mL/min and Ca is normal so prolia inj can be given on 12/20/19

## 2019-12-20 ENCOUNTER — Ambulatory Visit (INDEPENDENT_AMBULATORY_CARE_PROVIDER_SITE_OTHER): Payer: Medicare Other

## 2019-12-20 DIAGNOSIS — M810 Age-related osteoporosis without current pathological fracture: Secondary | ICD-10-CM | POA: Diagnosis not present

## 2019-12-20 MED ORDER — DENOSUMAB 60 MG/ML ~~LOC~~ SOSY
60.0000 mg | PREFILLED_SYRINGE | Freq: Once | SUBCUTANEOUS | Status: AC
  Administered 2019-12-20: 60 mg via SUBCUTANEOUS

## 2019-12-20 NOTE — Progress Notes (Signed)
Per orders of Dr. Selena Batten, injection of PROLIA PFS given by De Nurse. Patient tolerated injection well.

## 2020-02-29 ENCOUNTER — Other Ambulatory Visit: Payer: Self-pay | Admitting: Family Medicine

## 2020-02-29 DIAGNOSIS — M810 Age-related osteoporosis without current pathological fracture: Secondary | ICD-10-CM

## 2020-03-21 DIAGNOSIS — H1013 Acute atopic conjunctivitis, bilateral: Secondary | ICD-10-CM | POA: Diagnosis not present

## 2020-03-22 DIAGNOSIS — H903 Sensorineural hearing loss, bilateral: Secondary | ICD-10-CM | POA: Diagnosis not present

## 2020-04-06 ENCOUNTER — Encounter: Payer: Self-pay | Admitting: Family Medicine

## 2020-04-10 ENCOUNTER — Other Ambulatory Visit: Payer: Self-pay

## 2020-04-10 MED ORDER — CETIRIZINE HCL 5 MG PO TABS: ORAL_TABLET | ORAL | 11 refills | Status: DC

## 2020-04-11 NOTE — Telephone Encounter (Signed)
Last Prolia on 12/20/19 so pt is not due for next inj until after 06/20/20. No prescription can be sent in until benefits are run nad a PA is done if needed. Benefits will not be run to find out what it will cost the pt until the end of March or first couple of weeks in April. Pt must approve cost which is obtained from running benefits. After it is approved labs have to be drawn and ok before a script for prolia will be sent.  LVM. Please forward call to this nurse.

## 2020-04-12 NOTE — Telephone Encounter (Signed)
Advised pt of process. She said to only run benefits through prescription benefit and it will not cost her anything. Advised this will be done and this office will contact her to get her scheduled for lab and NV when it is time. Pt appreciative and verbalized understanding.

## 2020-05-02 ENCOUNTER — Telehealth: Payer: Self-pay

## 2020-05-02 DIAGNOSIS — M810 Age-related osteoporosis without current pathological fracture: Secondary | ICD-10-CM

## 2020-05-02 NOTE — Telephone Encounter (Signed)
Submitted benefit verification-waiting for response Last injection: 12/20/19 Next injection due after 06/20/20

## 2020-05-10 ENCOUNTER — Other Ambulatory Visit: Payer: Self-pay | Admitting: Family Medicine

## 2020-05-10 NOTE — Telephone Encounter (Signed)
Pharmacy requests refill on: Atorvastatin 20 mg   LAST REFILL: 05/30/2019 (Q-90, R-3) LAST OV:07/21/2019 NEXT OV: Not Scheduled  PHARMACY: [id number] [place], [place]  Earliest [place] Date: 05/29/2020

## 2020-05-14 NOTE — Addendum Note (Signed)
Addended by: Karenann Cai on: 05/14/2020 04:56 PM   Modules accepted: Orders

## 2020-05-14 NOTE — Telephone Encounter (Signed)
Spoke with patient and informed her that she will not have to pay anything OOP. Patient verbalized understanding. Labs scheduled for 4/18 and NV scheduled for 5/3.

## 2020-05-14 NOTE — Telephone Encounter (Signed)
Benefit verification received. No PA. OOP cost is $0. Please schedule patient for lab and NV appointment. °

## 2020-05-21 NOTE — Telephone Encounter (Signed)
Received notification that PA expired on 05/19/20-injection scheduled and due after this date. Resubmitted PA request on [employer]ail[employer] website today. Pending decision. 

## 2020-05-23 NOTE — Telephone Encounter (Deleted)
Submitted PA through Covermymeds. Prior PA was done for medical benefit. Waiting response. Key: [id number] - PA Case ID: [id number]

## 2020-05-23 NOTE — Telephone Encounter (Signed)
Received notification that PA expired on 05/19/20-injection scheduled and due after this date. Resubmitted PA request on [employer]ail[employer] website today. Pending decision.

## 2020-05-25 NOTE — Telephone Encounter (Signed)
Called [employer] to follow up on PA. It is approved for dates 05/21/20-05/20/21. Reference # [id number]

## 2020-05-29 NOTE — Telephone Encounter (Signed)
Received fax with PA approval for prolia. Exp of 05/20/21. Made copy and placed in scan folder.

## 2020-06-06 ENCOUNTER — Ambulatory Visit (INDEPENDENT_AMBULATORY_CARE_PROVIDER_SITE_OTHER): Payer: Medicare Other | Admitting: Family Medicine

## 2020-06-06 ENCOUNTER — Other Ambulatory Visit: Payer: Self-pay

## 2020-06-06 ENCOUNTER — Telehealth: Payer: Self-pay | Admitting: *Deleted

## 2020-06-06 ENCOUNTER — Encounter: Payer: Self-pay | Admitting: Family Medicine

## 2020-06-06 VITALS — BP 122/64 | HR 62 | Temp 97.5°F | Ht 63.5 in | Wt 188.8 lb

## 2020-06-06 DIAGNOSIS — Z0000 Encounter for general adult medical examination without abnormal findings: Secondary | ICD-10-CM

## 2020-06-06 DIAGNOSIS — E785 Hyperlipidemia, unspecified: Secondary | ICD-10-CM

## 2020-06-06 DIAGNOSIS — M810 Age-related osteoporosis without current pathological fracture: Secondary | ICD-10-CM | POA: Diagnosis not present

## 2020-06-06 MED ORDER — ATORVASTATIN CALCIUM 20 MG PO TABS: 20.0000 mg | ORAL_TABLET | Freq: Every day | ORAL | 3 refills | Status: DC

## 2020-06-06 NOTE — Telephone Encounter (Signed)
Sent in script to [facility]

## 2020-06-06 NOTE — Progress Notes (Signed)
Subjective:   Denise Parker is a 70 y.o. female who presents for Medicare Annual (Subsequent) preventive examination.  Heartburn improving  - more topical treatment for pain - tumeric w/ improvement   Review of Systems    Review of Systems  Constitutional: Negative for chills and fever.  HENT: Negative for congestion and sore throat.   Eyes: Negative for blurred vision and double vision.  Respiratory: Negative for shortness of breath.   Cardiovascular: Negative for chest pain.  Gastrointestinal: Negative for heartburn, nausea and vomiting.  Genitourinary: Negative.   Musculoskeletal: Negative.  Negative for myalgias.  Skin: Negative for rash.  Neurological: Negative for dizziness and headaches.  Endo/Heme/Allergies: Does not bruise/bleed easily.  Psychiatric/Behavioral: Negative for depression. The patient is not nervous/anxious.     Cardiac Risk Factors include: advanced age (>70men, >74 women);dyslipidemia;obesity (BMI >30kg/m2)     Objective:    Today's Vitals   06/06/20 0850  BP: 122/64  Pulse: 62  Temp: (!) 97.5 F (36.4 C)  TempSrc: Temporal  SpO2: 99%  Weight: 188 lb 12 oz (85.6 kg)  Height: 5' 3.5" (1.613 m)   Body mass index is 32.91 kg/m.  Advanced Directives 06/06/2020 09/11/2017 07/30/2017 07/08/2017 07/29/2016 07/07/2016 06/13/2015  Does Patient Have a Medical Advance Directive? No No No No No No Yes  Type of Advance Directive - - - - - - Midwife;Living will  Does patient want to make changes to medical advance directive? - - - - - - No - Patient declined  Copy of Healthcare Power of Attorney in Chart? - - - - - - No - copy requested  Would patient like information on creating a medical advance directive? Yes (MAU/Ambulatory/Procedural Areas - Information given) No - Patient declined - Yes (MAU/Ambulatory/Procedural Areas - Information given) No - Patient declined - -    Current Medications (verified) Outpatient Encounter  Medications as of 06/06/2020  Medication Sig  . baclofen (LIORESAL) 10 MG tablet Take 10 mg by mouth at bedtime as needed for muscle spasms.   . Calcium Carbonate-Vit D-Min (CALTRATE 600+D PLUS MINERALS) 600-800 MG-UNIT TABS Take 2 tablets by mouth.  . cetirizine (ZYRTEC) 5 MG tablet TAKE 2 TABLETS(10 MG) BY MOUTH DAILY (Patient taking differently: Take 10 mg by mouth as needed for allergies. TAKE 2 TABLETS(10 MG) BY MOUTH DAILY)  . CRANBERRY-VITAMIN C PO Take 1 tablet by mouth daily.   Marland Kitchen denosumab (PROLIA) 60 MG/ML SOSY injection Inject 60 mg into the skin every 6 (six) months.  . docusate sodium (COLACE) 100 MG capsule Take 200 mg by mouth daily as needed for mild constipation.  Marland Kitchen ELDERBERRY PO Take 2 each by mouth daily at 12 noon.  . fluticasone (FLONASE) 50 MCG/ACT nasal spray Place 1 spray into both nostrils daily.  Marland Kitchen GLUCOSAMINE CHONDROITIN COMPLX PO Take 1 tablet by mouth daily.   Marland Kitchen MELATONIN PO Take by mouth.  . Menthol, Topical Analgesic, (BIOFREEZE EX) Apply 1 application topically daily as needed (bursitis in the hip and back pain).  . naproxen sodium (ALEVE) 220 MG tablet Take 220 mg by mouth at bedtime.  . Turmeric 500 MG TABS Take 500 mg by mouth 3 (three) times daily.  . vitamin E 400 UNIT capsule Take 400 Units by mouth daily.  . [DISCONTINUED] atorvastatin (LIPITOR) 20 MG tablet Take 1 tablet (20 mg total) by mouth daily.  Marland Kitchen atorvastatin (LIPITOR) 20 MG tablet Take 1 tablet (20 mg total) by mouth daily.   No  facility-administered encounter medications on file as of 06/06/2020.    Allergies (verified) Probiotic product   History: Past Medical History:  Diagnosis Date  . Arthritis   . Bursitis    back  . HLD (hyperlipidemia)   . HOH (hard of hearing)    Past Surgical History:  Procedure Laterality Date  . BREAST CYST ASPIRATION Left 90s   benign  . CATARACT EXTRACTION W/PHACO Left 03/26/2017   Procedure: CATARACT EXTRACTION PHACO AND INTRAOCULAR LENS PLACEMENT  (IOC);  Surgeon: Nevada Crane, MD;  Location: [facility] ORS;  Service: Ophthalmology;  Laterality: Left;   fluid pack lot #   [id number] [place]    00:23.1 AP%  7.5 CDE   1.71   . CATARACT EXTRACTION W/PHACO Right 07/30/2017   Procedure: CATARACT EXTRACTION PHACO AND INTRAOCULAR LENS PLACEMENT (IOC);  Surgeon: Nevada Crane, MD;  Location: [facility] ORS;  Service: Ophthalmology;  Laterality: Right;  fluid pack lot #  [id number]  Exp  03/26/2018 [place]   00:32.1 AP%   5.9 CDE   1.81  . COLONOSCOPY WITH PROPOFOL N/A 09/11/2017   Procedure: COLONOSCOPY WITH PROPOFOL;  Surgeon: Pasty Spillers, MD;  Location: [facility] END[facility]COPY;  Service: Gastroenterology;  Laterality: N/A;  . TONSILLECTOMY    . TUBAL LIGATION     Family History  Problem Relation Age of Onset  . Heart disease Mother   . Heart disease Father   . Breast cancer Sister 43  . Leukemia Sister   . Heart attack Brother 45  . Colon cancer Sister 95  . Lupus Sister   . Breast cancer Sister   . Lupus Sister    Social History   Socioeconomic History  . Marital status: Married    Spouse name: [name]  . Number of children: 1  . 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 Highest education level: Not on file  Occupational History  . Not on file  Tobacco Use  . Smoking status: Never Smoker  . Smokeless tobacco: Never Used  Substance and Sexual Activity  . Alcohol use: Yes    Alcohol/week: 21.0 standard drinks    Types: 21 Glasses of wine per week    Comment: Wine-regular  . Drug use: No  . Sexual activity: Yes    Birth control/protection: Surgical, Post-menopausal  Other Topics Concern  . Not on file  Social History Narrative      Does not have a living will or HPOA   Desires CPR, would not want prolonged life support if futile.      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[employer]      Family: Son - [name], has 5 grandchildren (2 grown in [place]) and 3 live here      Enjoys: reading, listen to books on tape, walk, hike, volunteering at the senior center      Exercise: walking - 5-7 times a week, 4 miles per day   Diet: pretty good, limits pork and beef, fish once a week      Safety   Seat belts: Yes    Guns: Yes  and secure   Safe in relationships: Yes       Social Determinants of Health   Financial Resource Strain: Not on file  Food Insecurity: Not on file  Transportation Needs: Not on file  Physical Activity: Not on file  Stress: Not on file  Social Connections: Not on file    Tobacco Counseling Counseling given: Not Answered   Clinical Intake:  Pre-visit preparation completed: Yes  Pain : No/denies pain     BMI - recorded: 32.91 Nutritional Status: BMI > 30  Obese Nutritional Risks: None Diabetes: No  How often do you need to have someone help you when you read instructions, pamphlets, or other written materials from your doctor or pharmacy?: 1 - Never What is the last grade level you completed in school?: masters degree  Diabetic? no  Interpreter Needed?: No      Activities of Daily Living In your present state of health, do you have any difficulty performing the following activities: 06/06/2020  Hearing? Y  Comment hearing aids help  Vision? N  Difficulty concentrating or making decisions? N  Walking or climbing stairs? N  Dressing or bathing? N  Doing errands, shopping? N  Preparing Food and eating ? N  Using the Toilet? N  In the past six months, have you accidently leaked urine? N  Do you have problems with loss of bowel control? N  Managing your Medications? N  Managing your Finances? N  Housekeeping or managing your Housekeeping? N  Some recent data might be hidden    Patient Care Team: Lynnda Child, MD as PCP - General (Family Medicine) Lemar Lofty, MD as Consulting Physician (Unknown Physician  Specialty)  Indicate any recent Medical Services you may have received from other than [facility] providers in the past year (date may be approximate).     Assessment:   This is a routine wellness examination for Shadasia.  Hearing/Vision screen  Hearing Screening   125Hz  250Hz  500Hz  1000Hz  2000Hz  3000Hz  4000Hz  6000Hz  8000Hz   Right ear:           Left ear:           Comments: Pt wears hearing aides  Vision Screening Comments: Last [facility] exam was 02/22 [facility]  Dietary issues and exercise activities discussed: Current Exercise Habits: Home exercise routine;Structured exercise class, Type of exercise: walking, Time (Minutes): 50, Frequency (Times/Week): 5, Weekly Exercise (Minutes/Week): 250, Intensity: Moderate, Exercise limited by: orthopedic condition(s)  Goals    . DIET - EAT MORE FRUITS AND VEGETABLES     Would like to get to 70% plant based diet    . Increase physical activity     Starting 07/07/2016, I will continue to exercise for at least 45 min 3-4 days per week.      . Weight (lb) < 146 lb (66.2 kg)      Depression Screen PHQ 2/9 Scores 06/06/2020 01/31/2019 07/08/2017 07/15/2016 07/07/2016 06/13/2015 01/24/2014  PHQ - 2 Score 0 0 0 0 0 0 0    Fall Risk Fall Risk  06/06/2020 01/31/2019 07/08/2017 07/15/2016 07/07/2016  Falls in the past year? 0 0 No No No  Number falls in past yr: 0 - - - -  Follow up - Falls evaluation completed - - -    FALL RISK PREVENTION PERTAINING TO THE HOME:  Any stairs in or around the home? Yes  If so, are there any without handrails? Yes  Home free of loose throw rugs in walkways, pet beds, electrical cords, etc? Yes  Adequate lighting in your home to reduce risk of falls? Yes   ASSISTIVE DEVICES UTILIZED TO PREVENT FALLS:  Life alert? No  Use of a cane, walker or w/c? No  Grab bars in the bathroom? No  Shower chair or bench in shower? No  Elevated toilet seat or a handicapped toilet? Yes    Gait steady and fast without use of assistive  device  Cognitive Function: MMSE - Mini Mental State Exam 07/07/2016 06/13/2015  Orientation to time 5 5  Orientation to Place 5 5  Registration 3 3  Attention/ Calculation 0 0  Recall 3 3  Language- name 2 objects 0 0  Language- repeat 1 1  Language- follow 3 step command 3 3  Language- read & follow direction 0 0  Write a sentence 0 0  Copy design 0 0  Total score 20 20         Mini-Cog - 06/06/20 0909    Normal clock drawing test? yes    How many words correct? 3            Immunizations Immunization History  Administered Date(s) Administered  . Fluad Quad(high Dose 65+) 12/02/2018, 11/30/2019  . Influenza, High Dose Seasonal PF 03/24/2018  . Influenza,inj,Quad PF,6+ Mos 02/08/2016, 03/03/2017  . Influenza-Unspecified 12/08/2013  . PFIZER(Purple Top)SARS-COV-2 Vaccination 04/10/2019, 05/10/2019, 12/07/2019  . Pneumococcal Conjugate-13 01/24/2014  . Pneumococcal Polysaccharide-23 02/01/2019  . Pneumococcal-Unspecified 11/23/2012  . Zoster 06/16/2011  . Zoster Recombinat (Shingrix) 07/28/2017, 09/29/2017    TDAP status: Due, Education has been provided regarding the importance of this vaccine. Advised may receive this vaccine at local pharmacy or Health Dept. Aware to provide a copy of the vaccination record if obtained from local pharmacy or Health Dept. Verbalized acceptance and understanding.  Flu Vaccine status: Up to date  Pneumococcal vaccine status: Up to date  Covid-19 vaccine status: Completed vaccines  Qualifies for Shingles Vaccine? Yes   Zostavax completed Yes   Shingrix Completed?: Yes  Screening Tests Health Maintenance  Topic Date Due  . INFLUENZA VACCINE  09/24/2020  . MAMMOGRAM  10/04/2020  . TETANUS/TDAP  01/24/2021  . COLONOSCOPY (Pts [phone] Insurance coverage will need to be confirmed)  09/12/2027  . DEXA SCAN  Completed  . COVID-19 Vaccine  Completed  . Hepatitis C Screening  Completed  . PNA vac Low Risk Adult  Completed  . HPV  VACCINES  Aged Out    Health Maintenance  There are no preventive care reminders to display for this patient.  Colorectal cancer screening: Type of screening: Colonoscopy. Completed 2019. Repeat every 10 years  Mammogram status: Completed 09/2019. Repeat every year  Bone Density status: Completed 01/2019. Results reflect: Bone density results: OSTEOPOROSIS. Repeat every 2 years.  Lung Cancer Screening: (Low Dose CT Chest recommended if Age 26-80 years, 30 pack-year currently smoking OR have quit w/in 15years.) does not qualify.   Lung Cancer Screening Referral: n/a  Additional Screening:  Hepatitis C Screening: does not qualify; Completed 2017  Vision Screening: Recommended annual ophthalmology exams for early detection of glaucoma and other disorders of the eye. Is the patient up to date with their annual eye exam?  Yes  Who is the provider or what is the name of the office in which the patient attends annual eye exams?  eye If pt is not established with a provider, would they like to be referred to a provider to establish care? n/a.   Dental Screening: Recommended annual dental exams for proper oral hygiene  Community Resource Referral / Chronic Care Management: CRR required this visit?  No   CCM required this visit?  No      Plan:     I  have personally reviewed and noted the following in the patient's chart:   . Medical and social history . Use of alcohol, tobacco or illicit drugs  . Current medications and supplements . Functional ability and status . Nutritional status . Physical activity . Advanced directives . List of other physicians . Hospitalizations, surgeries, and ER visits in previous 12 months . Vitals . Screenings to include cognitive, depression, and falls . Referrals and appointments  In addition, I have reviewed and discussed with patient certain preventive protocols, quality metrics, and best practice recommendations. A written  personalized care plan for preventive services as well as general preventive health recommendations were provided to patient.     Lynnda Child, MD   06/06/2020

## 2020-06-06 NOTE — Telephone Encounter (Signed)
Script for Prolia sent in to [facility]

## 2020-06-06 NOTE — Patient Instructions (Addendum)
Tetanus shot - you can get at the pharmacy  - due in December 2022

## 2020-06-06 NOTE — Telephone Encounter (Signed)
Also, Patient is scheduled for labs on 06/11/20 and NV on 06/26/20.  Shannon, I spoke with patient today. She stated that her RX for Prolia is covered through [facility] and that we should not be filling through [employer]. She stated last time it was done through [employer] and became an issue. Her RX is covered through [facility] and all she would need is a RX sent in to them for Prolia. Patient has Medicare A and B, T[employer]are and [employer]. PA was done on [employer] for the injection already and was approved through 2023. Patient states she spoke with you and Margaret in a conference call about this last time. I apologized to the patient about this and that it was ran through all of her insurance. I advised patient I would speak with you about this to see if there was something I did wrong in this case. I thought we still need to run PA for insurance even though patient is using [facility]. Patient stated [employer] insurance does not have medication coverage for her, she has it with [facility]. Below is her information for [facility] card:  RxBIN: [id number] RxPCN: [id number] RxGRP: [id number] ID [id number] Issuer ([id number]): [id number] 

## 2020-06-06 NOTE — Telephone Encounter (Signed)
Also, Patient is scheduled for labs on 06/11/20 and NV on 06/26/20.  Denise Parker, I spoke with patient today. She stated that her RX for Prolia is covered through [facility] and that we should not be filling through [employer]. She stated last time it was done through [place] [facility] and became an issue. Her RX is covered through [facility] and all she would need is a RX sent in to them for Prolia. Patient has Medicare A and B, T[employer]are and [employer]. PA was done on [employer] for the injection already and was approved through 2023. Patient states she spoke with you and Claris Che in a conference call about this last time. I apologized to the patient about this and that it was ran through all of her insurance. I advised patient I would speak with you about this to see if there was something I did wrong in this case. I thought we still need to run PA for insurance even though patient is using [facility]. Patient stated [employer] does not have medication coverage for her, she has it with [facility]. Below is her information for [facility] card:  RxBIN: [id number] RxPCN: [id number] RxGRP: [id number] ID [id number] Issuer [phone]): [phone]

## 2020-06-06 NOTE — Telephone Encounter (Signed)
Adgil from [facility] left a voicemail requesting that a script for Prolia be called to them for the patient.  Telephone  # [phone]

## 2020-06-11 ENCOUNTER — Other Ambulatory Visit: Payer: Self-pay

## 2020-06-11 ENCOUNTER — Other Ambulatory Visit (INDEPENDENT_AMBULATORY_CARE_PROVIDER_SITE_OTHER): Payer: Medicare Other

## 2020-06-11 DIAGNOSIS — E785 Hyperlipidemia, unspecified: Secondary | ICD-10-CM

## 2020-06-11 DIAGNOSIS — M810 Age-related osteoporosis without current pathological fracture: Secondary | ICD-10-CM | POA: Diagnosis not present

## 2020-06-11 DIAGNOSIS — Z0000 Encounter for general adult medical examination without abnormal findings: Secondary | ICD-10-CM

## 2020-06-11 LAB — LIPID PANEL
Cholesterol: 198 mg/dL (ref 0–200)
HDL: 74.6 mg/dL (ref >39.00)
LDL Cholesterol: 110 mg/dL — ABNORMAL HIGH (ref 0–99)
NonHDL: 123.03
Total CHOL/HDL Ratio: 3
Triglycerides: 65 mg/dL (ref 0.0–149.0)
VLDL: 13 mg/dL (ref 0.0–40.0)

## 2020-06-11 LAB — COMPREHENSIVE METABOLIC PANEL
ALT: 12 U/L (ref 0–35)
AST: 16 U/L (ref 0–37)
Albumin: 4 g/dL (ref 3.5–5.2)
Alkaline Phosphatase: 28 U/L — ABNORMAL LOW (ref 39–117)
BUN: 11 mg/dL (ref 6–23)
CO2: 29 mEq/L (ref 19–32)
Calcium: 8.8 mg/dL (ref 8.4–10.5)
Chloride: 106 mEq/L (ref 96–112)
Creatinine, Ser: 0.6 mg/dL (ref 0.40–1.20)
GFR: 90.33 mL/min (ref >60.00)
Glucose, Bld: 85 mg/dL (ref 70–99)
Potassium: 4.3 mEq/L (ref 3.5–5.1)
Sodium: 142 mEq/L (ref 135–145)
Total Bilirubin: 0.5 mg/dL (ref 0.2–1.2)
Total Protein: 6.8 g/dL (ref 6.0–8.3)

## 2020-06-11 LAB — VITAMIN D 25 HYDROXY (VIT D DEFICIENCY, FRACTURES): VITD: 39.82 ng/mL (ref 30.00–100.00)

## 2020-06-15 ENCOUNTER — Telehealth: Payer: Self-pay

## 2020-06-15 DIAGNOSIS — Z23 Encounter for immunization: Secondary | ICD-10-CM | POA: Diagnosis not present

## 2020-06-15 NOTE — Telephone Encounter (Signed)
Spoke with [facility] representative, order for Prolia was pending, no one on their end reached out to the patient to get consent to ship medication. This was done with me on the phone and left message for patient to call [facility] back. Once that is done they will ship medication to our office. They also asked about refills on this and I declined due to the process we have to follow before we can order and approve medication to be ordered/administered every 6 months.

## 2020-06-15 NOTE — Telephone Encounter (Signed)
Still have not received the prolia from [facility]

## 2020-06-15 NOTE — Telephone Encounter (Signed)
Also, Patient is scheduled for labs on 06/11/20 and NV on 06/26/20.   Labs ok, Ca is normal and CrCl is 116.37mL/min. Ok for inj.

## 2020-06-15 NOTE — Telephone Encounter (Signed)
Noted  

## 2020-06-15 NOTE — Telephone Encounter (Signed)
Pt called wanting to see why she has another appt next week for labs... advised it was for labs needed prior to Prolia... Pt recently had f/u labs that per Sherrie George, RN stated would suffice...Marland Kitchen pt is aware and expressed understanding

## 2020-06-18 NOTE — Telephone Encounter (Signed)
Called patient and left a message to follow up with patient if she has spoke with [facility] to give permission for them to send prolia to our office.

## 2020-06-19 NOTE — Telephone Encounter (Signed)
Prolia received and placed in clinic refrigerator by Carollee Herter, RN

## 2020-06-20 ENCOUNTER — Other Ambulatory Visit: Payer: 59

## 2020-06-26 ENCOUNTER — Ambulatory Visit (INDEPENDENT_AMBULATORY_CARE_PROVIDER_SITE_OTHER): Payer: Medicare Other

## 2020-06-26 ENCOUNTER — Other Ambulatory Visit: Payer: Self-pay

## 2020-06-26 DIAGNOSIS — M810 Age-related osteoporosis without current pathological fracture: Secondary | ICD-10-CM | POA: Diagnosis not present

## 2020-06-26 MED ORDER — DENOSUMAB 60 MG/ML ~~LOC~~ SOSY
60.0000 mg | PREFILLED_SYRINGE | Freq: Once | SUBCUTANEOUS | Status: AC
  Administered 2020-06-26: 60 mg via SUBCUTANEOUS

## 2020-06-26 NOTE — Progress Notes (Signed)
Per orders of Dr. Selena Batten, injection of Prolia  given in Left arm by Sherrie George. Patient tolerated injection well.

## 2020-08-23 ENCOUNTER — Encounter: Payer: Self-pay | Admitting: Family Medicine

## 2020-08-23 ENCOUNTER — Other Ambulatory Visit: Payer: Self-pay | Admitting: Family Medicine

## 2020-08-23 DIAGNOSIS — E2839 Other primary ovarian failure: Secondary | ICD-10-CM

## 2020-08-24 ENCOUNTER — Other Ambulatory Visit: Payer: Self-pay | Admitting: Family Medicine

## 2020-08-24 DIAGNOSIS — Z1231 Encounter for screening mammogram for malignant neoplasm of breast: Secondary | ICD-10-CM

## 2020-09-05 ENCOUNTER — Encounter: Payer: Self-pay | Admitting: Emergency Medicine

## 2020-09-05 ENCOUNTER — Other Ambulatory Visit: Payer: Self-pay

## 2020-09-05 ENCOUNTER — Ambulatory Visit
Admission: EM | Admit: 2020-09-05 | Discharge: 2020-09-05 | Disposition: A | Discharge status: Home / Self Care | Payer: Medicare Other | Attending: Emergency Medicine | Admitting: Emergency Medicine

## 2020-09-05 DIAGNOSIS — L03116 Cellulitis of left lower limb: Secondary | ICD-10-CM | POA: Diagnosis not present

## 2020-09-05 MED ORDER — SULFAMETHOXAZOLE-TRIMETHOPRIM 800-160 MG PO TABS: 1.0000 | ORAL_TABLET | Freq: Two times a day (BID) | ORAL | 0 refills | Status: AC

## 2020-09-05 NOTE — ED Triage Notes (Addendum)
Patient c/o Rash x 4 days.   Patient endorses the LFT inner thigh is where rash is present.   Patient denies itchiness. Patient denies drainage.   Patient endorses "a burning" sensation.   Patient endorses "the rash is in a scattered pattern".   Patient endorses increased erythema and increasing size, stating " it started with one bmp with a white head and now theres three of them".   Patient has used tea tree oil gel with no relief of symptoms. Patient has used neosporin with no relief of symptoms.

## 2020-09-05 NOTE — Discharge Instructions (Addendum)
Take the antibiotic as directed.  Follow up with your primary care provider if your symptoms are not improving.     

## 2020-09-05 NOTE — ED Provider Notes (Signed)
[facility]    CSN: [id number] Arrival date & time: 09/05/20  0940      History   Chief Complaint Chief Complaint  Patient presents with   Rash    HPI Denise Parker is a 70 y.o. female.  Patient presents with 4-day history of rash on left inner thigh.  The area started as a small "white head" which drained but developed redness around it.  She does not know of any injury or insect bites.  She has been treating this at home with tea tree oil and antibiotic ointment.  She denies fever, chills, numbness, weakness, or other symptoms.  Her medical history includes hyperlipidemia, hard of hearing, arthritis.  The history is provided by the patient and medical records.   Past Medical History:  Diagnosis Date   Arthritis    Bursitis    back   HLD (hyperlipidemia)    HOH (hard of hearing)     Patient Active Problem List   Diagnosis Date Noted   Allergic conjunctivitis 11/04/2019   Right hip pain 05/30/2019   Chronic gastritis without bleeding 05/30/2019   Heavy alcohol use 05/30/2019   Restless legs 05/30/2019   Allergic reaction 06/29/2018   GERD (gastroesophageal reflux disease) 03/03/2017   Trochanteric bursitis, right hip 10/21/2016   Osteoporosis 09/09/2016   Back pain 07/15/2016   Decreased hearing 07/15/2016   Myofascial pain syndrome 10/08/2015   Arthritis 06/20/2015   HLD (hyperlipidemia) 06/20/2015   Herpes zoster 04/13/2013   Metatarsalgia 09/27/2012   Allergic rhinitis 06/16/2011    Past Surgical History:  Procedure Laterality Date   BREAST CYST ASPIRATION Left 90s   benign   CATARACT EXTRACTION W/PHACO Left 03/26/2017   Procedure: CATARACT EXTRACTION PHACO AND INTRAOCULAR LENS PLACEMENT (IOC);  Surgeon: Nevada Crane, MD;  Location: [facility];  Service: Ophthalmology;  Laterality: Left;   fluid pack lot #   [id number] [place]    00:23.1 AP%  7.5 CDE   1.71    CATARACT EXTRACTION W/PHACO Right 07/30/2017   Procedure: CATARACT EXTRACTION  PHACO AND INTRAOCULAR LENS PLACEMENT (IOC);  Surgeon: Nevada Crane, MD;  Location: [facility];  Service: Ophthalmology;  Laterality: Right;  fluid pack lot #  [id number]  Exp  03/26/2018 [place]   00:32.1 AP%   5.9 CDE   1.81   COLONOSCOPY WITH PROPOFOL N/A 09/11/2017   Procedure: COLONOSCOPY WITH PROPOFOL;  Surgeon: Pasty Spillers, MD;  Location: [facility];  Service: Gastroenterology;  Laterality: N/A;   TONSILLECTOMY     TUBAL LIGATION      OB History   No obstetric history on file.      Home Medications    Prior to Admission medications   Medication Sig Start Date End Date Taking? Authorizing Provider  atorvastatin (LIPITOR) 20 MG tablet Take 1 tablet (20 mg total) by mouth daily. 06/06/20  Yes Lynnda Child, MD  Calcium Carbonate-Vit D-Min (CALTRATE 600+D PLUS MINERALS) 600-800 MG-UNIT TABS Take 2 tablets by mouth.   Yes [provider]  cetirizine (ZYRTEC) 5 MG tablet TAKE 2 TABLETS(10 MG) BY MOUTH DAILY Patient taking differently: Take 10 mg by mouth as needed for allergies. TAKE 2 TABLETS(10 MG) BY MOUTH DAILY 04/10/20  Yes Lynnda Child, MD  ELDERBERRY PO Take 2 each by mouth daily at 12 noon.   Yes [provider]  GLUCOSAMINE CHONDROITIN COMPLX PO Take 1 tablet by mouth daily.    Yes [provider]  MELATONIN PO Take  by mouth.   Yes [provider]  naproxen sodium (ALEVE) 220 MG tablet Take 220 mg by mouth at bedtime.   Yes [provider]  sulfamethoxazole-trimethoprim (BACTRIM DS) 800-160 MG tablet Take 1 tablet by mouth 2 (two) times daily for 7 days. 09/05/20 09/12/20 Yes Mickie Bail, NP  Turmeric 500 MG TABS Take 500 mg by mouth 3 (three) times daily.   Yes [provider]  vitamin E 400 UNIT capsule Take 400 Units by mouth daily.   Yes [provider]  baclofen (LIORESAL) 10 MG tablet Take 10 mg by mouth at bedtime as needed for muscle spasms.     [provider]  CRANBERRY-VITAMIN C PO  Take 1 tablet by mouth daily.     [provider]  docusate sodium (COLACE) 100 MG capsule Take 200 mg by mouth daily as needed for mild constipation.    [provider]  fluticasone (FLONASE) 50 MCG/ACT nasal spray Place 1 spray into both nostrils daily. 09/19/19   Lynnda Child, MD  Menthol, Topical Analgesic, (BIOFREEZE EX) Apply 1 application topically daily as needed (bursitis in the hip and back pain).    [provider]  PROLIA 60 MG/ML SOSY injection INJECT 60MG  SUBCUTANEOUSLY  EVERY 6 MONTHS (GIVEN AT MD OFFICE) 06/06/20   06/08/20, MD    Family History Family History  Problem Relation Age of Onset   Heart disease Mother    Heart disease Father    Breast cancer Sister 70   Leukemia Sister    Heart attack Brother 37   Colon cancer Sister 57   Lupus Sister    Breast cancer Sister    Lupus Sister     Social History Social History   Tobacco Use   Smoking status: Never   Smokeless tobacco: Never  Substance Use Topics   Alcohol use: Yes    Alcohol/week: 21.0 standard drinks    Types: 21 Glasses of wine per week    Comment: Wine-regular   Drug use: No     Allergies   Probiotic product   Review of Systems Review of Systems  Constitutional:  Negative for chills and fever.  Respiratory:  Negative for cough and shortness of breath.   Cardiovascular:  Negative for chest pain and palpitations.  Gastrointestinal:  Negative for abdominal pain and vomiting.  Musculoskeletal:  Negative for arthralgias and gait problem.  Skin:  Positive for color change and wound.  Neurological:  Negative for weakness and numbness.  All other systems reviewed and are negative.   Physical Exam Triage Vital Signs ED Triage Vitals  Enc Vitals Group     BP 09/05/20 0953 (!) 148/79     Pulse Rate 09/05/20 0953 (!) 59     Resp 09/05/20 0953 18     Temp 09/05/20 0953 98.1 F (36.7 C)     Temp Source 09/05/20 0953 Oral     SpO2 09/05/20 0953 97 %      Weight --      Height --      Head Circumference --      Peak Flow --      Pain Score 09/05/20 0954 5     Pain Loc --      Pain Edu? --      Excl. in GC? --    No data found.  Updated Vital Signs BP (!) 148/79 (BP Location: Left Arm)   Pulse (!) 59   Temp 98.1 F (  36.7 C) (Oral)   Resp 18   SpO2 97%   Visual Acuity Right Eye Distance:   Left Eye Distance:   Bilateral Distance:    Right Eye Near:   Left Eye Near:    Bilateral Near:     Physical Exam Vitals and nursing note reviewed.  Constitutional:      General: She is not in acute distress.    Appearance: She is well-developed. She is not ill-appearing.  HENT:     Head: Normocephalic and atraumatic.     Mouth/Throat:     Mouth: Mucous membranes are moist.  Eyes:     Conjunctiva/sclera: Conjunctivae normal.  Cardiovascular:     Rate and Rhythm: Normal rate and regular rhythm.     Heart sounds: Normal heart sounds.  Pulmonary:     Effort: Pulmonary effort is normal. No respiratory distress.     Breath sounds: Normal breath sounds.  Abdominal:     Palpations: Abdomen is soft.     Tenderness: There is no abdominal tenderness.  Musculoskeletal:        General: Normal range of motion.     Cervical back: Neck supple.  Skin:    General: Skin is warm and dry.     Findings: Erythema and lesion present.     Comments: Left upper thigh: approximate 3 mm open lesion with 6 cm surrounding light erythema; no drainage.  Neurological:     General: No focal deficit present.     Mental Status: She is alert and oriented to person, place, and time.     Sensory: No sensory deficit.     Motor: No weakness.     Gait: Gait normal.  Psychiatric:        Mood and Affect: Mood normal.        Behavior: Behavior normal.     UC Treatments / Results  Labs (all labs ordered are listed, but only abnormal results are displayed) Labs Reviewed - No data to display  EKG   Radiology No results found.  Procedures Procedures  (including critical care time)  Medications Ordered in UC Medications - No data to display  Initial Impression / Assessment and Plan / UC Course  I have reviewed the triage vital signs and the nursing notes.  Pertinent labs & imaging results that were available during my care of the patient were reviewed by me and considered in my medical decision making (see chart for details).  Cellulitis of left thigh.  Treating with Bactrim.  Wound care instructions and signs of worsening infection discussed.  Education provided on cellulitis.  Instructed patient to follow-up with PCP if her symptoms are not improving.  She agrees to plan of care   Final Clinical Impressions(s) / UC Diagnoses   Final diagnoses:  Cellulitis of left thigh     Discharge Instructions      Take the antibiotic as directed.    Follow up with your primary care provider if your symptoms are not improving.         ED Prescriptions     Medication Sig Dispense Auth. Provider   sulfamethoxazole-trimethoprim (BACTRIM DS) 800-160 MG tablet Take 1 tablet by mouth 2 (two) times daily for 7 days. 14 tablet Mickie Bail, NP      PDMP not reviewed this encounter.   Mickie Bail, NP 09/05/20 1023

## 2020-10-09 ENCOUNTER — Ambulatory Visit
Admission: RE | Admit: 2020-10-09 | Discharge: 2020-10-09 | Disposition: A | Discharge status: Home / Self Care | Payer: Medicare Other | Source: Ambulatory Visit | Attending: Primary Care | Admitting: Primary Care

## 2020-10-09 ENCOUNTER — Ambulatory Visit
Admission: RE | Admit: 2020-10-09 | Discharge: 2020-10-09 | Disposition: A | Discharge status: Home / Self Care | Payer: Medicare Other | Source: Ambulatory Visit | Attending: Family Medicine | Admitting: Family Medicine

## 2020-10-09 ENCOUNTER — Other Ambulatory Visit: Payer: Self-pay

## 2020-10-09 DIAGNOSIS — Z1231 Encounter for screening mammogram for malignant neoplasm of breast: Secondary | ICD-10-CM | POA: Insufficient documentation

## 2020-10-09 DIAGNOSIS — E2839 Other primary ovarian failure: Secondary | ICD-10-CM | POA: Diagnosis not present

## 2020-10-09 DIAGNOSIS — M810 Age-related osteoporosis without current pathological fracture: Secondary | ICD-10-CM | POA: Diagnosis not present

## 2020-10-26 ENCOUNTER — Other Ambulatory Visit: Payer: Self-pay | Admitting: Family Medicine

## 2020-11-13 ENCOUNTER — Other Ambulatory Visit: Payer: Self-pay

## 2020-11-13 ENCOUNTER — Encounter: Payer: Self-pay | Admitting: Emergency Medicine

## 2020-11-13 ENCOUNTER — Ambulatory Visit
Admission: EM | Admit: 2020-11-13 | Discharge: 2020-11-13 | Disposition: A | Discharge status: Home / Self Care | Payer: Medicare Other

## 2020-11-13 DIAGNOSIS — H6981 Other specified disorders of Eustachian tube, right ear: Secondary | ICD-10-CM | POA: Diagnosis not present

## 2020-11-13 DIAGNOSIS — H9201 Otalgia, right ear: Secondary | ICD-10-CM

## 2020-11-13 NOTE — Discharge Instructions (Addendum)
Take Tylenol or ibuprofen as needed for discomfort.  Take plain Mucinex for congestion.    Follow up with your primary care provider if your symptoms are not improving.

## 2020-11-13 NOTE — ED Provider Notes (Signed)
[facility]    CSN: [id number] Arrival date & time: 11/13/20  1135      History   Chief Complaint Chief Complaint  Patient presents with   Otalgia    HPI Denise Parker is a 70 y.o. female.  Patient presents with right ear pain since yesterday evening.  She reports history of seasonal allergies and states she has been sneezing since last week.  She denies fever, chills, sore throat, cough, shortness of breath, or other symptoms.  Treatment at home with Zyrtec and Flonase.  The history is provided by the patient and medical records.   Past Medical History:  Diagnosis Date   Arthritis    Bursitis    back   HLD (hyperlipidemia)    HOH (hard of hearing)     Patient Active Problem List   Diagnosis Date Noted   Allergic conjunctivitis 11/04/2019   Right hip pain 05/30/2019   Chronic gastritis without bleeding 05/30/2019   Heavy alcohol use 05/30/2019   Restless legs 05/30/2019   Allergic reaction 06/29/2018   GERD (gastroesophageal reflux disease) 03/03/2017   Trochanteric bursitis, right hip 10/21/2016   Osteoporosis 09/09/2016   Back pain 07/15/2016   Decreased hearing 07/15/2016   Myofascial pain syndrome 10/08/2015   Arthritis 06/20/2015   HLD (hyperlipidemia) 06/20/2015   Herpes zoster 04/13/2013   Metatarsalgia 09/27/2012   Allergic rhinitis 06/16/2011    Past Surgical History:  Procedure Laterality Date   BREAST CYST ASPIRATION Left 90s   benign   CATARACT EXTRACTION W/PHACO Left 03/26/2017   Procedure: CATARACT EXTRACTION PHACO AND INTRAOCULAR LENS PLACEMENT (IOC);  Surgeon: Nevada Crane, MD;  Location: [facility];  Service: Ophthalmology;  Laterality: Left;   fluid pack lot #   [id number] [place]    00:23.1 AP%  7.5 CDE   1.71    CATARACT EXTRACTION W/PHACO Right 07/30/2017   Procedure: CATARACT EXTRACTION PHACO AND INTRAOCULAR LENS PLACEMENT (IOC);  Surgeon: Nevada Crane, MD;  Location: [facility];  Service: Ophthalmology;  Laterality:  Right;  fluid pack lot #  [id number]  Exp  03/26/2018 [place]   00:32.1 AP%   5.9 CDE   1.81   COLONOSCOPY WITH PROPOFOL N/A 09/11/2017   Procedure: COLONOSCOPY WITH PROPOFOL;  Surgeon: Pasty Spillers, MD;  Location: [facility];  Service: Gastroenterology;  Laterality: N/A;   TONSILLECTOMY     TUBAL LIGATION      OB History   No obstetric history on file.      Home Medications    Prior to Admission medications   Medication Sig Start Date End Date Taking? Authorizing Provider  atorvastatin (LIPITOR) 20 MG tablet Take 1 tablet (20 mg total) by mouth daily. 06/06/20  Yes Lynnda Child, MD  baclofen (LIORESAL) 10 MG tablet Take 10 mg by mouth at bedtime as needed for muscle spasms.    Yes [provider]  Calcium Carbonate-Vit D-Min (CALTRATE 600+D PLUS MINERALS) 600-800 MG-UNIT TABS Take 2 tablets by mouth.   Yes [provider]  cetirizine (ZYRTEC) 5 MG tablet TAKE 2 TABLETS(10 MG) BY MOUTH DAILY Patient taking differently: Take 10 mg by mouth as needed for allergies. TAKE 2 TABLETS(10 MG) BY MOUTH DAILY 04/10/20  Yes Lynnda Child, MD  CRANBERRY-VITAMIN C PO Take 1 tablet by mouth daily.    Yes [provider]  ELDERBERRY PO Take 2 each by mouth daily at 12 noon.   Yes [provider]  fluticasone (FLONASE) 50 MCG/ACT nasal spray  SHAKE LIQUID AND USE 1 SPRAY IN EACH NOSTRIL DAILY 10/26/20  Yes Lynnda Child, MD  GLUCOSAMINE CHONDROITIN COMPLX PO Take 1 tablet by mouth daily.    Yes [provider]  MELATONIN PO Take by mouth.   Yes [provider]  PROLIA 60 MG/ML SOSY injection INJECT 60MG  SUBCUTANEOUSLY  EVERY 6 MONTHS (GIVEN AT MD OFFICE) 06/06/20  Yes 06/08/20, MD  Turmeric 500 MG TABS Take 500 mg by mouth 3 (three) times daily.   Yes [provider]  vitamin E 400 UNIT capsule Take 400 Units by mouth daily.   Yes [provider]  docusate sodium (COLACE) 100 MG capsule Take 200 mg by mouth daily as  needed for mild constipation.    [provider]  Menthol, Topical Analgesic, (BIOFREEZE EX) Apply 1 application topically daily as needed (bursitis in the hip and back pain).    [provider]  naproxen sodium (ALEVE) 220 MG tablet Take 220 mg by mouth at bedtime.    [provider]    Family History Family History  Problem Relation Age of Onset   Heart disease Mother    Heart disease Father    Breast cancer Sister 48   Leukemia Sister    Heart attack Brother 28   Colon cancer Sister 98   Lupus Sister    Breast cancer Sister    Lupus Sister     Social History Social History   Tobacco Use   Smoking status: Never   Smokeless tobacco: Never  Vaping Use   Vaping Use: Never used  Substance Use Topics   Alcohol use: Yes    Alcohol/week: 21.0 standard drinks    Types: 21 Glasses of wine per week    Comment: Wine-regular   Drug use: No     Allergies   Probiotic product   Review of Systems Review of Systems  Constitutional:  Negative for chills and fever.  HENT:  Positive for ear pain and sneezing. Negative for ear discharge, hearing loss and sore throat.   Respiratory:  Negative for cough and shortness of breath.   Cardiovascular:  Negative for chest pain and palpitations.  Skin:  Negative for color change and rash.  All other systems reviewed and are negative.   Physical Exam Triage Vital Signs ED Triage Vitals  Enc Vitals Group     BP      Pulse      Resp      Temp      Temp src      SpO2      Weight      Height      Head Circumference      Peak Flow      Pain Score      Pain Loc      Pain Edu?      Excl. in GC?    No data found.  Updated Vital Signs BP 128/69 (BP Location: Left Arm)   Pulse 65   Temp 98.9 F (37.2 C) (Oral)   SpO2 96%   Visual Acuity Right Eye Distance:   Left Eye Distance:   Bilateral Distance:    Right Eye Near:   Left Eye Near:    Bilateral Near:     Physical Exam Vitals and nursing  note reviewed.  Constitutional:      General: She is not in acute distress.    Appearance: She is well-developed. She is not ill-appearing.  HENT:  Head: Normocephalic and atraumatic.     Right Ear: Tympanic membrane and ear canal normal.     Left Ear: Tympanic membrane and ear canal normal.     Nose: Nose normal.     Mouth/Throat:     Mouth: Mucous membranes are moist.     Pharynx: Oropharynx is clear.  Eyes:     Conjunctiva/sclera: Conjunctivae normal.  Cardiovascular:     Rate and Rhythm: Normal rate and regular rhythm.     Heart sounds: Normal heart sounds.  Pulmonary:     Effort: Pulmonary effort is normal. No respiratory distress.     Breath sounds: Normal breath sounds.  Abdominal:     Palpations: Abdomen is soft.     Tenderness: There is no abdominal tenderness.  Musculoskeletal:     Cervical back: Neck supple.  Skin:    General: Skin is warm and dry.  Neurological:     General: No focal deficit present.     Mental Status: She is alert and oriented to person, place, and time.     Gait: Gait normal.  Psychiatric:        Mood and Affect: Mood normal.        Behavior: Behavior normal.     UC Treatments / Results  Labs (all labs ordered are listed, but only abnormal results are displayed) Labs Reviewed - No data to display  EKG   Radiology No results found.  Procedures Procedures (including critical care time)  Medications Ordered in UC Medications - No data to display  Initial Impression / Assessment and Plan / UC Course  I have reviewed the triage vital signs and the nursing notes.  Pertinent labs & imaging results that were available during my care of the patient were reviewed by me and considered in my medical decision making (see chart for details).  Right ear pain and eustachian tube dysfunction.  No indication of infection or cerumen impaction.  Discussed symptomatic treatment with plain Mucinex, Tylenol or ibuprofen, warm compresses.   Instructed patient to follow-up with her PCP or an ENT if her symptoms persist.  She agrees to plan of care.   Final Clinical Impressions(s) / UC Diagnoses   Final diagnoses:  Right ear pain  Dysfunction of right eustachian tube     Discharge Instructions      Take Tylenol or ibuprofen as needed for discomfort.  Take plain Mucinex for congestion.    Follow up with your primary care provider if your symptoms are not improving.         ED Prescriptions   None    PDMP not reviewed this encounter.   Mickie Bail, NP 11/13/20 1304

## 2020-11-13 NOTE — ED Triage Notes (Signed)
Pt c/o right ear pain that started last night. Last week pt had URI symptoms.

## 2020-11-22 DIAGNOSIS — H04123 Dry eye syndrome of bilateral lacrimal glands: Secondary | ICD-10-CM | POA: Diagnosis not present

## 2020-12-01 ENCOUNTER — Other Ambulatory Visit: Payer: Self-pay | Admitting: Family Medicine

## 2020-12-01 DIAGNOSIS — M810 Age-related osteoporosis without current pathological fracture: Secondary | ICD-10-CM

## 2020-12-07 NOTE — Telephone Encounter (Signed)
Pharmacy called for follow up on refill. Please advise. Pharmacy callback: [phone]

## 2020-12-07 NOTE — Telephone Encounter (Signed)
Patient will be due for Prolia after 12/28/20. Have to submit benefits and discuss with patient before refilling. Will speak with pharmacy once I have all the information needed for this

## 2020-12-13 ENCOUNTER — Telehealth: Payer: Self-pay

## 2020-12-13 DIAGNOSIS — M810 Age-related osteoporosis without current pathological fracture: Secondary | ICD-10-CM

## 2020-12-13 NOTE — Telephone Encounter (Signed)
Benefits submitted-pending  Next injection due after 12/28/20-usually patient uses mail order for RX. Will need to verify with patient this time again.

## 2020-12-21 ENCOUNTER — Other Ambulatory Visit: Payer: Self-pay

## 2020-12-21 ENCOUNTER — Ambulatory Visit (INDEPENDENT_AMBULATORY_CARE_PROVIDER_SITE_OTHER): Payer: Medicare Other

## 2020-12-21 DIAGNOSIS — Z23 Encounter for immunization: Secondary | ICD-10-CM

## 2020-12-26 NOTE — Addendum Note (Signed)
Addended by: Consuella Lose on: 12/26/2020 10:32 AM   Modules accepted: Orders

## 2020-12-26 NOTE — Telephone Encounter (Signed)
Confirmed with patient that she wants to use mail order pharmacy still. RX sent in. Will keep this message to follow up to make sure delivery will be sent out on time. [place] on 01/02/21

## 2020-12-26 NOTE — Telephone Encounter (Signed)
Benefits received. Patient would like to use mail order pharmacy. RX sent in. Will follow up with pharmacy to make sure they send this to [place] location. Lab on 12/27/20 and [place] on 01/02/21.

## 2020-12-27 ENCOUNTER — Other Ambulatory Visit: Payer: Self-pay

## 2020-12-27 ENCOUNTER — Other Ambulatory Visit (INDEPENDENT_AMBULATORY_CARE_PROVIDER_SITE_OTHER): Payer: Medicare Other

## 2020-12-27 DIAGNOSIS — M810 Age-related osteoporosis without current pathological fracture: Secondary | ICD-10-CM

## 2020-12-27 LAB — BASIC METABOLIC PANEL
BUN: 14 mg/dL (ref 6–23)
CO2: 30 mEq/L (ref 19–32)
Calcium: 8.8 mg/dL (ref 8.4–10.5)
Chloride: 103 mEq/L (ref 96–112)
Creatinine, Ser: 0.7 mg/dL (ref 0.40–1.20)
GFR: 86.7 mL/min (ref >60.00)
Glucose, Bld: 93 mg/dL (ref 70–99)
Potassium: 4.3 mEq/L (ref 3.5–5.1)
Sodium: 139 mEq/L (ref 135–145)

## 2020-12-28 NOTE — Telephone Encounter (Signed)
Called [facility] and spoke with  Enid Skeens and confirmed that Prolia will be shipped to the [place] office on 01/01/21. Confirmed address also.

## 2021-01-02 ENCOUNTER — Ambulatory Visit (INDEPENDENT_AMBULATORY_CARE_PROVIDER_SITE_OTHER): Payer: Medicare Other

## 2021-01-02 ENCOUNTER — Other Ambulatory Visit: Payer: Self-pay

## 2021-01-02 DIAGNOSIS — M810 Age-related osteoporosis without current pathological fracture: Secondary | ICD-10-CM | POA: Diagnosis not present

## 2021-01-02 MED ORDER — DENOSUMAB 60 MG/ML ~~LOC~~ SOSY
60.0000 mg | PREFILLED_SYRINGE | Freq: Once | SUBCUTANEOUS | Status: AC
  Administered 2021-01-02: 60 mg via SUBCUTANEOUS

## 2021-01-02 NOTE — Telephone Encounter (Signed)
CrCl is  99.61 mL/min. Calcium 8.8 normal. Prolia injection received for patient from mail order pharmacy on 01/01/21

## 2021-01-02 NOTE — Progress Notes (Signed)
Per orders of Mayra Reel, in the absence of Dr. Selena Batten, injection of Prolia given by Erby Pian. Patient tolerated injection well.

## 2021-01-10 ENCOUNTER — Telehealth: Payer: Medicare Other | Admitting: Family Medicine

## 2021-01-10 DIAGNOSIS — J069 Acute upper respiratory infection, unspecified: Secondary | ICD-10-CM

## 2021-01-10 MED ORDER — BENZONATATE 100 MG PO CAPS: 100.0000 mg | ORAL_CAPSULE | Freq: Three times a day (TID) | ORAL | 0 refills | Status: DC | PRN

## 2021-01-10 NOTE — Progress Notes (Signed)
Virtual Visit Consent   Denise Parker, you are scheduled for a virtual visit with a [name] provider today.     Just as with appointments in the office, your consent must be obtained to participate.  Your consent will be active for this visit and any virtual visit you may have with one of our providers in the next 365 days.     If you have a MyChart account, a copy of this consent can be sent to you electronically.  All virtual visits are billed to your insurance company just like a traditional visit in the office.    As this is a virtual visit, video technology does not allow for your provider to perform a traditional examination.  This may limit your provider's ability to fully assess your condition.  If your provider identifies any concerns that need to be evaluated in person or the need to arrange testing (such as labs, EKG, etc.), we will make arrangements to do so.     Although advances in technology are sophisticated, we cannot ensure that it will always work on either your end or our end.  If the connection with a video visit is poor, the visit may have to be switched to a telephone visit.  With either a video or telephone visit, we are not always able to ensure that we have a secure connection.     I need to obtain your verbal consent now.   Are you willing to proceed with your visit today?    Denise Parker has provided verbal consent on 01/10/2021 for a virtual visit (video or telephone).   Denise Finner, NP   Date: 01/10/2021 8:19 AM   Virtual Visit via Video Note   I, Denise Parker, connected with  Denise Parker  ([id number], 1950/07/30) on 01/10/21 at  8:15 AM EST by a video-enabled telemedicine application and verified that I am speaking with the correct person using two identifiers.  Location: Patient: Virtual Visit Location Patient: Home Provider: Virtual Visit Location Provider: Home Office   I discussed the limitations of evaluation and  management by telemedicine and the availability of in person appointments. The patient expressed understanding and agreed to proceed.    History of Present Illness: Denise Parker is a 70 y.o. who identifies as a female who was assigned female at birth, and is being seen today for cough that started this past Saturday. Flonase, mucinex- with increase from one tablet to two tablets per day. Started up with chills, fevers, body aches- has been trying theraflu and other OTC medications for her symptoms. Reports cough is the worse symptom now.  Known sick contact-Grandson came home sick last week-  with what is going around the school- COVID negative  She got the flu shot 2 weeks ago.  Problems:  Patient Active Problem List   Diagnosis Date Noted   Allergic conjunctivitis 11/04/2019   Right hip pain 05/30/2019   Chronic gastritis without bleeding 05/30/2019   Heavy alcohol use 05/30/2019   Restless legs 05/30/2019   Allergic reaction 06/29/2018   GERD (gastroesophageal reflux disease) 03/03/2017   Trochanteric bursitis, right hip 10/21/2016   Osteoporosis 09/09/2016   Back pain 07/15/2016   Decreased hearing 07/15/2016   Myofascial pain syndrome 10/08/2015   Arthritis 06/20/2015   HLD (hyperlipidemia) 06/20/2015   Herpes zoster 04/13/2013   Metatarsalgia 09/27/2012   Allergic rhinitis 06/16/2011    Allergies:  Allergies  Allergen Reactions   Probiotic Product Itching  and Swelling    Can't remember the name, it was a women's probiotic supplement. It cause burning, itching and swelling of the ears   Medications:  Current Outpatient Medications:    atorvastatin (LIPITOR) 20 MG tablet, Take 1 tablet (20 mg total) by mouth daily., Disp: 90 tablet, Rfl: 3   baclofen (LIORESAL) 10 MG tablet, Take 10 mg by mouth at bedtime as needed for muscle spasms. , Disp: , Rfl:    Calcium Carbonate-Vit D-Min (CALTRATE 600+D PLUS MINERALS) 600-800 MG-UNIT TABS, Take 2 tablets by mouth., Disp:  , Rfl:    cetirizine (ZYRTEC) 5 MG tablet, TAKE 2 TABLETS(10 MG) BY MOUTH DAILY (Patient taking differently: Take 10 mg by mouth as needed for allergies. TAKE 2 TABLETS(10 MG) BY MOUTH DAILY), Disp: 60 tablet, Rfl: 11   CRANBERRY-VITAMIN C PO, Take 1 tablet by mouth daily. , Disp: , Rfl:    docusate sodium (COLACE) 100 MG capsule, Take 200 mg by mouth daily as needed for mild constipation., Disp: , Rfl:    ELDERBERRY PO, Take 2 each by mouth daily at 12 noon., Disp: , Rfl:    fluticasone (FLONASE) 50 MCG/ACT nasal spray, SHAKE LIQUID AND USE 1 SPRAY IN EACH NOSTRIL DAILY, Disp: 16 g, Rfl: 6   GLUCOSAMINE CHONDROITIN COMPLX PO, Take 1 tablet by mouth daily. , Disp: , Rfl:    MELATONIN PO, Take by mouth., Disp: , Rfl:    Menthol, Topical Analgesic, (BIOFREEZE EX), Apply 1 application topically daily as needed (bursitis in the hip and back pain)., Disp: , Rfl:    naproxen sodium (ALEVE) 220 MG tablet, Take 220 mg by mouth at bedtime., Disp: , Rfl:    PROLIA 60 MG/ML SOSY injection, INJECT 60MG  SUBCUTANEOUSLY  EVERY 6 MONTHS, Disp: 1 mL, Rfl: 0   Turmeric 500 MG TABS, Take 500 mg by mouth 3 (three) times daily., Disp: , Rfl:    vitamin E 400 UNIT capsule, Take 400 Units by mouth daily., Disp: , Rfl:   Observations/Objective: Patient is well-developed, well-nourished in no acute distress.  Resting comfortably  at home.  Head is normocephalic, atraumatic.  No labored breathing.  Speech is clear and coherent with logical content.  Patient is alert and oriented at baseline.  Dry cough -appreciated   Assessment and Plan: 1. Viral URI with cough S&S consistent with VIRAL URI Cough is worse symptom will try perles to help  No signs of secondary infection as of today  - benzonatate (TESSALON) 100 MG capsule; Take 1 capsule (100 mg total) by mouth 3 (three) times daily as needed for cough.  Dispense: 30 capsule; Refill: 0  Reviewed side effects, risks and benefits of medication.    Patient  acknowledged agreement and understanding of the plan.   I discussed the assessment and treatment plan with the patient. The patient was provided an opportunity to ask questions and all were answered. The patient agreed with the plan and demonstrated an understanding of the instructions.   The patient was advised to call back or seek an in-person evaluation if the symptoms worsen or if the condition fails to improve as anticipated.   The above assessment and management plan was discussed with the patient. The patient verbalized understanding of and has agreed to the management plan. Patient is aware to call the clinic if symptoms persist or worsen. Patient is aware when to return to the clinic for a follow-up visit. Patient educated on when it is appropriate to go to the emergency department.  Follow Up Instructions: I discussed the assessment and treatment plan with the patient. The patient was provided an opportunity to ask questions and all were answered. The patient agreed with the plan and demonstrated an understanding of the instructions.  A copy of instructions were sent to the patient via MyChart unless otherwise noted below.    The patient was advised to call back or seek an in-person evaluation if the symptoms worsen or if the condition fails to improve as anticipated.  Time:  I spent 10 minutes with the patient via telehealth technology discussing the above problems/concerns.    Perlie Mayo, NP

## 2021-01-10 NOTE — Patient Instructions (Signed)
I appreciate the opportunity to provide you with care for your health and wellness. ° °Take medication as directed ° °I hope you feel better soon! ° °Please continue to practice social distancing to keep you, your family, and our community safe.  °If you must go out, please wear a mask and practice good handwashing. ° °Have a wonderful day. °With Gratitude, °Prestyn Mahn, DNP, AGNP-BC ° ° ° ° °

## 2021-01-15 ENCOUNTER — Ambulatory Visit: Payer: 59

## 2021-01-15 ENCOUNTER — Ambulatory Visit (INDEPENDENT_AMBULATORY_CARE_PROVIDER_SITE_OTHER): Payer: Medicare Other

## 2021-01-15 ENCOUNTER — Ambulatory Visit
Admission: EM | Admit: 2021-01-15 | Discharge: 2021-01-15 | Disposition: A | Discharge status: Home / Self Care | Payer: Medicare Other | Attending: Family Medicine | Admitting: Family Medicine

## 2021-01-15 ENCOUNTER — Encounter: Payer: Self-pay | Admitting: Emergency Medicine

## 2021-01-15 DIAGNOSIS — R059 Cough, unspecified: Secondary | ICD-10-CM | POA: Diagnosis not present

## 2021-01-15 DIAGNOSIS — Z20822 Contact with and (suspected) exposure to covid-19: Secondary | ICD-10-CM | POA: Diagnosis not present

## 2021-01-15 DIAGNOSIS — J22 Unspecified acute lower respiratory infection: Secondary | ICD-10-CM

## 2021-01-15 MED ORDER — CEFTRIAXONE SODIUM 500 MG IJ SOLR
500.0000 mg | Freq: Once | INTRAMUSCULAR | Status: AC
  Administered 2021-01-15: 500 mg via INTRAMUSCULAR

## 2021-01-15 MED ORDER — METHYLPREDNISOLONE SODIUM SUCC 125 MG IJ SOLR
125.0000 mg | Freq: Once | INTRAMUSCULAR | Status: AC
  Administered 2021-01-15: 125 mg via INTRAMUSCULAR

## 2021-01-15 MED ORDER — PREDNISONE 20 MG PO TABS: 40.0000 mg | ORAL_TABLET | Freq: Every day | ORAL | 0 refills | Status: DC

## 2021-01-15 MED ORDER — CEFDINIR 300 MG PO CAPS
300.0000 mg | ORAL_CAPSULE | Freq: Two times a day (BID) | ORAL | 0 refills | Status: AC
Start: 2021-01-15 — End: 2021-01-25

## 2021-01-15 MED ORDER — BENZONATATE 100 MG PO CAPS
200.0000 mg | ORAL_CAPSULE | Freq: Three times a day (TID) | ORAL | 0 refills | Status: DC | PRN
Start: 2021-01-15 — End: 2021-06-10

## 2021-01-15 MED ORDER — ALBUTEROL SULFATE HFA 108 (90 BASE) MCG/ACT IN AERS
2.0000 | INHALATION_SPRAY | Freq: Once | RESPIRATORY_TRACT | Status: AC
  Administered 2021-01-15: 2 via RESPIRATORY_TRACT

## 2021-01-15 NOTE — ED Provider Notes (Signed)
Renaldo Fiddler    CSN: [id number] Arrival date & time: 01/15/21  1147      History   Chief Complaint Chief Complaint  Patient presents with   Cough   Shortness of Breath    HPI Denise Parker is a 70 y.o. female.   HPI Patient with a medical history of hyperlipidemia, GERD, presents today for evaluation of 2 weeks of persistent cough, fatigue, generalized body aches, generalized weakness, and over the last few days shortness of breath.  She reports everyone in her home had been sick with some type of upper respiratory illness and her symptoms resolved however no one was tested for COVID or flu except her grandson who tested negative.  She reports her symptoms have progressively worsened and she is now having pain with deep breathing.  Patient has no underlying respiratory disease and she is a non-smoker.  Past Medical History:  Diagnosis Date   Arthritis    Bursitis    back   HLD (hyperlipidemia)    HOH (hard of hearing)     Patient Active Problem List   Diagnosis Date Noted   Allergic conjunctivitis 11/04/2019   Right hip pain 05/30/2019   Chronic gastritis without bleeding 05/30/2019   Heavy alcohol use 05/30/2019   Restless legs 05/30/2019   Allergic reaction 06/29/2018   GERD (gastroesophageal reflux disease) 03/03/2017   Trochanteric bursitis, right hip 10/21/2016   Osteoporosis 09/09/2016   Back pain 07/15/2016   Decreased hearing 07/15/2016   Myofascial pain syndrome 10/08/2015   Arthritis 06/20/2015   HLD (hyperlipidemia) 06/20/2015   Herpes zoster 04/13/2013   Metatarsalgia 09/27/2012   Allergic rhinitis 06/16/2011    Past Surgical History:  Procedure Laterality Date   BREAST CYST ASPIRATION Left 90s   benign   CATARACT EXTRACTION W/PHACO Left 03/26/2017   Procedure: CATARACT EXTRACTION PHACO AND INTRAOCULAR LENS PLACEMENT (IOC);  Surgeon: Nevada Crane, MD;  Location: [facility] ORS;  Service: Ophthalmology;  Laterality: Left;   fluid  pack lot #   [id number] [place]    00:23.1 AP%  7.5 CDE   1.71    CATARACT EXTRACTION W/PHACO Right 07/30/2017   Procedure: CATARACT EXTRACTION PHACO AND INTRAOCULAR LENS PLACEMENT (IOC);  Surgeon: Nevada Crane, MD;  Location: [facility] ORS;  Service: Ophthalmology;  Laterality: Right;  fluid pack lot #  [id number]  Exp  03/26/2018 [place]   00:32.1 AP%   5.9 CDE   1.81   COLONOSCOPY WITH PROPOFOL N/A 09/11/2017   Procedure: COLONOSCOPY WITH PROPOFOL;  Surgeon: Pasty Spillers, MD;  Location: [facility] ENDOSCOPY;  Service: Gastroenterology;  Laterality: N/A;   TONSILLECTOMY     TUBAL LIGATION      OB History   No obstetric history on file.      Home Medications    Prior to Admission medications   Medication Sig Start Date End Date Taking? Authorizing Provider  atorvastatin (LIPITOR) 20 MG tablet Take 1 tablet (20 mg total) by mouth daily. 06/06/20  Yes Lynnda Child, MD  Calcium Carbonate-Vit D-Min (CALTRATE 600+D PLUS MINERALS) 600-800 MG-UNIT TABS Take 2 tablets by mouth.   Yes [provider]  cetirizine (ZYRTEC) 5 MG tablet TAKE 2 TABLETS(10 MG) BY MOUTH DAILY Patient taking differently: Take 10 mg by mouth as needed for allergies. TAKE 2 TABLETS(10 MG) BY MOUTH DAILY 04/10/20  Yes Lynnda Child, MD  CRANBERRY-VITAMIN C PO Take 1 tablet by mouth daily.    Yes [provider]  docusate sodium (COLACE)  100 MG capsule Take 200 mg by mouth daily as needed for mild constipation.   Yes [provider]  ELDERBERRY PO Take 2 each by mouth daily at 12 noon.   Yes [provider]  fluticasone (FLONASE) 50 MCG/ACT nasal spray SHAKE LIQUID AND USE 1 SPRAY IN EACH NOSTRIL DAILY 10/26/20  Yes Lynnda Child, MD  GLUCOSAMINE CHONDROITIN COMPLX PO Take 1 tablet by mouth daily.    Yes [provider]  MELATONIN PO Take by mouth.   Yes [provider]  Menthol, Topical Analgesic, (BIOFREEZE EX) Apply 1 application topically daily as needed (bursitis in the  hip and back pain).   Yes [provider]  naproxen sodium (ALEVE) 220 MG tablet Take 220 mg by mouth at bedtime.   Yes [provider]  PROLIA 60 MG/ML SOSY injection INJECT 60MG  SUBCUTANEOUSLY  EVERY 6 MONTHS 12/26/20  Yes 13/2/22, MD  Turmeric 500 MG TABS Take 500 mg by mouth 3 (three) times daily.   Yes [provider]  vitamin E 400 UNIT capsule Take 400 Units by mouth daily.   Yes [provider]  baclofen (LIORESAL) 10 MG tablet Take 10 mg by mouth at bedtime as needed for muscle spasms.     [provider]  benzonatate (TESSALON) 100 MG capsule Take 1 capsule (100 mg total) by mouth 3 (three) times daily as needed for cough. 01/10/21   01/12/21, NP    Family History Family History  Problem Relation Age of Onset   Heart disease Mother    Heart disease Father    Breast cancer Sister 58   Leukemia Sister    Heart attack Brother 62   Colon cancer Sister 32   Lupus Sister    Breast cancer Sister    Lupus Sister     Social History Social History   Tobacco Use   Smoking status: Never   Smokeless tobacco: Never  Vaping Use   Vaping Use: Never used  Substance Use Topics   Alcohol use: Yes    Alcohol/week: 21.0 standard drinks    Types: 21 Glasses of wine per week    Comment: Wine-regular   Drug use: No     Allergies   Probiotic product   Review of Systems Review of Systems Pertinent negatives listed in HPI   Physical Exam Triage Vital Signs ED Triage Vitals [01/15/21 1220]  Enc Vitals Group     BP 115/77     Pulse Rate 86     Resp      Temp 99.4 F (37.4 C)     Temp Source Oral     SpO2 93 %     Weight      Height      Head Circumference      Peak Flow      Pain Score      Pain Loc      Pain Edu?      Excl. in GC?    No data found.  Updated Vital Signs BP 115/77 (BP Location: Left Arm)   Pulse 86   Temp 99.4 F (37.4 C) (Oral)   SpO2 93%   Visual Acuity Right Eye Distance:   Left  Eye Distance:   Bilateral Distance:    Right Eye Near:   Left Eye Near:    Bilateral Near:     Physical Exam Constitutional:      General: She is not in acute distress.  Appearance: She is ill-appearing. She is not toxic-appearing.  HENT:     Head: Normocephalic.     Mouth/Throat:     Pharynx: Pharyngeal swelling present. No oropharyngeal exudate.  Eyes:     Extraocular Movements: Extraocular movements intact.     Pupils: Pupils are equal, round, and reactive to light.     Comments: Redness of bilateral eyes   Cardiovascular:     Rate and Rhythm: Normal rate and regular rhythm.  Pulmonary:     Breath sounds: Decreased air movement present. Rhonchi present. No wheezing or rales.     Comments: Initially decreased air movement following administration of Solu-Medrol following with 2 puffs of albuterol inhaler patient was able to improve her work of breathing .  Lymphadenopathy:     Cervical: Cervical adenopathy present.  Skin:    General: Skin is warm.     Capillary Refill: Capillary refill takes less than 2 seconds.  Neurological:     General: No focal deficit present.     GCS: GCS eye subscore is 4. GCS verbal subscore is 5. GCS motor subscore is 6.  Psychiatric:        Attention and Perception: Attention normal.        Mood and Affect: Mood normal.        Speech: Speech normal.        Cognition and Memory: Cognition normal.     UC Treatments / Results  Labs (all labs ordered are listed, but only abnormal results are displayed) Labs Reviewed - No data to display  EKG   Radiology DG Chest 2 View  Result Date: 01/15/2021 CLINICAL DATA:  Productive cough. EXAM: CHEST - 2 VIEW COMPARISON:  None. FINDINGS: The heart size and mediastinal contours are within normal limits. Both lungs are clear. The visualized skeletal structures are unremarkable. IMPRESSION: No active cardiopulmonary disease. Electronically Signed   By: Lupita Raider M.D.   On: 01/15/2021 12:15     Procedures Procedures (including critical care time)  Medications Ordered in UC Medications  methylPREDNISolone sodium succinate (SOLU-MEDROL) 125 mg/2 mL injection 125 mg (125 mg Intramuscular Given 01/15/21 1246)  cefTRIAXone (ROCEPHIN) injection 500 mg (500 mg Intramuscular Given 01/15/21 1246)  albuterol (VENTOLIN HFA) 108 (90 Base) MCG/ACT inhaler 2 puff (2 puffs Inhalation Given 01/15/21 1241)    Initial Impression / Assessment and Plan / UC Course  I have reviewed the triage vital signs and the nursing notes.  Pertinent labs & imaging results that were available during my care of the patient were reviewed by me and considered in my medical decision making (see chart for details).  Clinical Course as of 01/15/21 1328  Tue Jan 15, 2021  1328 Chest x-ray negative  [KH]    Clinical Course User Index [KH] Bing Neighbors, FNP   Given clinical symptoms and presentations suspect likely COVID-19 viral infection.  Given the duration of time and that symptoms have progressively worsened suspect a secondary bronchitis versus pneumonia.  Chest x-ray was negative for any opacities which would suggest pneumonia.  Patient achieved great improvement of work of breathing along with air movement on exam following after receiving a steroid shot and 2 puffs of an albuterol inhaler.  Patient remained in the clinic for an additional 45 minutes after initial evaluation for monitoring.  Patient verbalized improvement of symptoms.  However given high risk factors of age, febrile, lower than baseline oxygen level advised if any of her symptoms do not readily improve or if they  worsen she is to go immediately to the emergency department given if this is a COVID-19 viral infection places her at increased risk for poor outcomes.  Advised patient to follow-up with her PCP in 1 week if unable to get in with PCP return here to urgent care for reevaluation.  Treatment per discharge medication orders. Final  Clinical Impressions(s) / UC Diagnoses   Final diagnoses:  Suspected COVID-19 virus infection  Lower respiratory infection (e.g., bronchitis, pneumonia, pneumonitis, pulmonitis)     Discharge Instructions      Start your steroids tomorrow she received a dose of steroids here at the urgent care. Start your first dose of antibiotics after you are able to eat dinner or put a light snack on your stomach this evening. You may take the cough suppressant tablets every 8 hours as needed. If any of your symptoms worsen go immediately to the emergency department or you develop any difficulty breathing follow-up at the ER.    Your COVID 19 results should result within 3-5 days. Negative results are immediately resulted to Mychart. Positive results will receive a follow-up call from our clinic. If symptoms are present, I recommend home quarantine until results are known.  Alternate Tylenol and ibuprofen as needed for body aches and fever.  Symptom management per recommendations discussed today.  If any breathing difficulty or chest pain develops go immediately to the closest emergency department for evaluation.     ED Prescriptions     Medication Sig Dispense Auth. Provider   cefdinir (OMNICEF) 300 MG capsule Take 1 capsule (300 mg total) by mouth 2 (two) times daily for 10 days. 20 capsule Bing Neighbors, FNP   benzonatate (TESSALON) 100 MG capsule Take 2 capsules (200 mg total) by mouth 3 (three) times daily as needed for cough. 40 capsule Bing Neighbors, FNP   predniSONE (DELTASONE) 20 MG tablet Take 2 tablets (40 mg total) by mouth daily with breakfast. 10 tablet Bing Neighbors, FNP      PDMP not reviewed this encounter.   Bing Neighbors, FNP 01/15/21 1330

## 2021-01-15 NOTE — Discharge Instructions (Addendum)
Start your steroids tomorrow she received a dose of steroids here at the urgent care. Start your first dose of antibiotics after you are able to eat dinner or put a light snack on your stomach this evening. You may take the cough suppressant tablets every 8 hours as needed. If any of your symptoms worsen go immediately to the emergency department or you develop any difficulty breathing follow-up at the ER.    Your COVID 19 results should result within 3-5 days. Negative results are immediately resulted to Mychart. Positive results will receive a follow-up call from our clinic. If symptoms are present, I recommend home quarantine until results are known.  Alternate Tylenol and ibuprofen as needed for body aches and fever.  Symptom management per recommendations discussed today.  If any breathing difficulty or chest pain develops go immediately to the closest emergency department for evaluation.

## 2021-01-15 NOTE — ED Triage Notes (Signed)
Pt presents with cough, runny nose x 2 weeks. Pt has some SOB and pain in the chest after coughing that started a few days ago.

## 2021-01-17 LAB — COVID-19, FLU A+B AND RSV
Influenza A, NAA: NOT DETECTED
Influenza B, NAA: NOT DETECTED
RSV, NAA: NOT DETECTED
SARS-CoV-2, NAA: NOT DETECTED

## 2021-01-22 ENCOUNTER — Ambulatory Visit
Admission: EM | Admit: 2021-01-22 | Discharge: 2021-01-22 | Disposition: A | Discharge status: Home / Self Care | Payer: Medicare Other

## 2021-01-22 ENCOUNTER — Encounter: Payer: Self-pay | Admitting: Emergency Medicine

## 2021-01-22 ENCOUNTER — Other Ambulatory Visit: Payer: Self-pay

## 2021-01-22 DIAGNOSIS — J069 Acute upper respiratory infection, unspecified: Secondary | ICD-10-CM | POA: Diagnosis not present

## 2021-01-22 NOTE — ED Triage Notes (Signed)
Patient was seen a week ago for cough and SOB advised to return today for follow up. She still has some cough worse with laying down, congestion and ears are popping.

## 2021-01-22 NOTE — ED Provider Notes (Signed)
Renaldo Fiddler    CSN: [id number] Arrival date & time: 01/22/21  1247      History   Chief Complaint Chief Complaint  Patient presents with   Cough   Nasal Congestion    HPI Taitum Menton is a 70 y.o. female.  Patient presents with improved but ongoing cough and congestion.  She was seen at this urgent care on 01/15/2021; diagnosed with suspected COVID, lower respiratory infection; treated with cefdinir, prednisone, Tessalon Perles; chest x-ray was negative; COVID, RSV, and flu negative.  She states her symptoms have greatly improved but she still has congestion and cough which is worse at night.  She denies fever, shortness of breath, or other symptoms.  The history is provided by the patient and medical records.   Past Medical History:  Diagnosis Date   Arthritis    Bursitis    back   HLD (hyperlipidemia)    HOH (hard of hearing)     Patient Active Problem List   Diagnosis Date Noted   Allergic conjunctivitis 11/04/2019   Right hip pain 05/30/2019   Chronic gastritis without bleeding 05/30/2019   Heavy alcohol use 05/30/2019   Restless legs 05/30/2019   Allergic reaction 06/29/2018   GERD (gastroesophageal reflux disease) 03/03/2017   Trochanteric bursitis, right hip 10/21/2016   Osteoporosis 09/09/2016   Back pain 07/15/2016   Decreased hearing 07/15/2016   Myofascial pain syndrome 10/08/2015   Arthritis 06/20/2015   HLD (hyperlipidemia) 06/20/2015   Herpes zoster 04/13/2013   Metatarsalgia 09/27/2012   Allergic rhinitis 06/16/2011    Past Surgical History:  Procedure Laterality Date   BREAST CYST ASPIRATION Left 90s   benign   CATARACT EXTRACTION W/PHACO Left 03/26/2017   Procedure: CATARACT EXTRACTION PHACO AND INTRAOCULAR LENS PLACEMENT (IOC);  Surgeon: Nevada Crane, MD;  Location: [facility];  Service: Ophthalmology;  Laterality: Left;   fluid pack lot #   [id number] [place]    00:23.1 AP%  7.5 CDE   1.71    CATARACT EXTRACTION W/PHACO  Right 07/30/2017   Procedure: CATARACT EXTRACTION PHACO AND INTRAOCULAR LENS PLACEMENT (IOC);  Surgeon: Nevada Crane, MD;  Location: [facility];  Service: Ophthalmology;  Laterality: Right;  fluid pack lot #  [id number]  Exp  03/26/2018 [place]   00:32.1 AP%   5.9 CDE   1.81   COLONOSCOPY WITH PROPOFOL N/A 09/11/2017   Procedure: COLONOSCOPY WITH PROPOFOL;  Surgeon: Pasty Spillers, MD;  Location: [facility];  Service: Gastroenterology;  Laterality: N/A;   TONSILLECTOMY     TUBAL LIGATION      OB History   No obstetric history on file.      Home Medications    Prior to Admission medications   Medication Sig Start Date End Date Taking? Authorizing Provider  atorvastatin (LIPITOR) 20 MG tablet Take 1 tablet (20 mg total) by mouth daily. 06/06/20   Lynnda Child, MD  baclofen (LIORESAL) 10 MG tablet Take 10 mg by mouth at bedtime as needed for muscle spasms.     [provider]  benzonatate (TESSALON) 100 MG capsule Take 2 capsules (200 mg total) by mouth 3 (three) times daily as needed for cough. 01/15/21   Bing Neighbors, FNP  Calcium Carbonate-Vit D-Min (CALTRATE 600+D PLUS MINERALS) 600-800 MG-UNIT TABS Take 2 tablets by mouth.    [provider]  cefdinir (OMNICEF) 300 MG capsule Take 1 capsule (300 mg total) by mouth 2 (two) times daily for 10 days. 01/15/21 01/25/21  Bing Neighbors, FNP  cetirizine (ZYRTEC) 5 MG tablet TAKE 2 TABLETS(10 MG) BY MOUTH DAILY Patient taking differently: Take 10 mg by mouth as needed for allergies. TAKE 2 TABLETS(10 MG) BY MOUTH DAILY 04/10/20   Lynnda Child, MD  CRANBERRY-VITAMIN C PO Take 1 tablet by mouth daily.     [provider]  docusate sodium (COLACE) 100 MG capsule Take 200 mg by mouth daily as needed for mild constipation.    [provider]  ELDERBERRY PO Take 2 each by mouth daily at 12 noon.    [provider]  fluticasone (FLONASE) 50 MCG/ACT nasal spray SHAKE LIQUID AND USE 1 SPRAY  IN EACH NOSTRIL DAILY 10/26/20   Lynnda Child, MD  GLUCOSAMINE CHONDROITIN COMPLX PO Take 1 tablet by mouth daily.     [provider]  MELATONIN PO Take by mouth.    [provider]  Menthol, Topical Analgesic, (BIOFREEZE EX) Apply 1 application topically daily as needed (bursitis in the hip and back pain).    [provider]  naproxen sodium (ALEVE) 220 MG tablet Take 220 mg by mouth at bedtime.    [provider]  predniSONE (DELTASONE) 20 MG tablet Take 2 tablets (40 mg total) by mouth daily with breakfast. 01/16/21   Bing Neighbors, FNP  PROLIA 60 MG/ML SOSY injection INJECT 60MG  SUBCUTANEOUSLY  EVERY 6 MONTHS 12/26/20   13/2/22, MD  Turmeric 500 MG TABS Take 500 mg by mouth 3 (three) times daily.    [provider]  vitamin E 400 UNIT capsule Take 400 Units by mouth daily.    [provider]    Family History Family History  Problem Relation Age of Onset   Heart disease Mother    Heart disease Father    Breast cancer Sister 85   Leukemia Sister    Heart attack Brother 105   Colon cancer Sister 76   Lupus Sister    Breast cancer Sister    Lupus Sister     Social History Social History   Tobacco Use   Smoking status: Never   Smokeless tobacco: Never  Vaping Use   Vaping Use: Never used  Substance Use Topics   Alcohol use: Yes    Alcohol/week: 21.0 standard drinks    Types: 21 Glasses of wine per week    Comment: Wine-regular   Drug use: No     Allergies   Probiotic product   Review of Systems Review of Systems  Constitutional:  Negative for chills and fever.  HENT:  Positive for congestion. Negative for ear pain and sore throat.   Respiratory:  Positive for cough. Negative for shortness of breath.   Cardiovascular:  Negative for chest pain and palpitations.  Gastrointestinal:  Negative for diarrhea and vomiting.  Skin:  Negative for color change and rash.  All other systems reviewed and are  negative.   Physical Exam Triage Vital Signs ED Triage Vitals  Enc Vitals Group     BP 01/22/21 1356 117/78     Pulse Rate 01/22/21 1356 88     Resp 01/22/21 1356 18     Temp 01/22/21 1356 98.1 F (36.7 C)     Temp Source 01/22/21 1356 Oral     SpO2 01/22/21 1356 97 %     Weight --      Height --      Head Circumference --      Peak Flow --  Pain Score 01/22/21 1355 0     Pain Loc --      Pain Edu? --      Excl. in GC? --    No data found.  Updated Vital Signs BP 117/78 (BP Location: Left Arm)   Pulse 88   Temp 98.1 F (36.7 C) (Oral)   Resp 18   SpO2 97%   Visual Acuity Right Eye Distance:   Left Eye Distance:   Bilateral Distance:    Right Eye Near:   Left Eye Near:    Bilateral Near:     Physical Exam Vitals and nursing note reviewed.  Constitutional:      General: She is not in acute distress.    Appearance: She is well-developed. She is not ill-appearing.  HENT:     Head: Normocephalic and atraumatic.     Right Ear: Tympanic membrane normal.     Left Ear: Tympanic membrane normal.     Nose: Nose normal.     Mouth/Throat:     Mouth: Mucous membranes are moist.     Pharynx: Oropharynx is clear.  Cardiovascular:     Rate and Rhythm: Normal rate and regular rhythm.     Heart sounds: Normal heart sounds.  Pulmonary:     Effort: Pulmonary effort is normal. No respiratory distress.     Breath sounds: Normal breath sounds.  Abdominal:     Palpations: Abdomen is soft.     Tenderness: There is no abdominal tenderness.  Musculoskeletal:     Cervical back: Neck supple.  Skin:    General: Skin is warm and dry.  Neurological:     Mental Status: She is alert.  Psychiatric:        Mood and Affect: Mood normal.        Behavior: Behavior normal.     UC Treatments / Results  Labs (all labs ordered are listed, but only abnormal results are displayed) Labs Reviewed - No data to display  EKG   Radiology No results  found.  Procedures Procedures (including critical care time)  Medications Ordered in UC Medications - No data to display  Initial Impression / Assessment and Plan / UC Course  I have reviewed the triage vital signs and the nursing notes.  Pertinent labs & imaging results that were available during my care of the patient were reviewed by me and considered in my medical decision making (see chart for details).   Viral URI.  No respiratory distress, lungs are clear, O2 sat 97% on room air.  Discussed ongoing symptomatic treatment for cough including Tessalon Perles.  Instructed her to follow-up with her PCP.  Patient agrees to plan of care.   Final Clinical Impressions(s) / UC Diagnoses   Final diagnoses:  Viral URI     Discharge Instructions      Continue taking Tessalon Perles as needed for cough.  Follow-up with your primary care provider.     ED Prescriptions   None    PDMP not reviewed this encounter.   Mickie Bail, NP 01/22/21 1427

## 2021-01-22 NOTE — Discharge Instructions (Addendum)
Continue taking Tessalon Perles as needed for cough.  Follow-up with your primary care provider.

## 2021-01-30 ENCOUNTER — Ambulatory Visit (INDEPENDENT_AMBULATORY_CARE_PROVIDER_SITE_OTHER): Payer: Medicare Other | Admitting: Family

## 2021-01-30 ENCOUNTER — Other Ambulatory Visit: Payer: Self-pay

## 2021-01-30 ENCOUNTER — Encounter: Payer: Self-pay | Admitting: Family

## 2021-01-30 VITALS — BP 120/78 | HR 72 | Temp 97.3°F | Ht 63.5 in | Wt 190.0 lb

## 2021-01-30 DIAGNOSIS — J309 Allergic rhinitis, unspecified: Secondary | ICD-10-CM

## 2021-01-30 DIAGNOSIS — J40 Bronchitis, not specified as acute or chronic: Secondary | ICD-10-CM | POA: Diagnosis not present

## 2021-01-30 DIAGNOSIS — R059 Cough, unspecified: Secondary | ICD-10-CM | POA: Diagnosis not present

## 2021-01-30 DIAGNOSIS — R0982 Postnasal drip: Secondary | ICD-10-CM

## 2021-01-30 HISTORY — DX: Cough, unspecified: R05.9

## 2021-01-30 HISTORY — DX: Bronchitis, not specified as acute or chronic: J40

## 2021-01-30 MED ORDER — CETIRIZINE HCL 10 MG PO TABS: ORAL_TABLET | ORAL | 1 refills | Status: DC

## 2021-01-30 MED ORDER — AZITHROMYCIN 250 MG PO TABS: ORAL_TABLET | ORAL | 0 refills | Status: AC

## 2021-01-30 NOTE — Assessment & Plan Note (Signed)
Cough medication as necessary, continue mucinex prn.

## 2021-01-30 NOTE — Progress Notes (Signed)
Established Patient Office Visit  Subjective:  Patient ID: Denise Parker, female    DOB: [birth year]  Age: 70 y.o. MRN: [id number]  CC:  Chief Complaint  Patient presents with   Follow-up    URI    HPI Justise Parker is here today for ER f/u. 01/10/21, virtual visit: sx started 01/05/21, cough, chills, fever, body aches. Given tessalon as suspected viral cough. Grandson had been positive with flu the week prior.  01/15/21 went to urgent care [facility]: given solu medrol in office, rocephin in office and given albuterol inhaler. Left with rx for tessalon, cefdinir, and prednisone. CXR reviewed, no cute findings.  01/22/21, went to ER with ongoing cough and congestion. Covid flu negative. Told to continue with tessalon perrles for cough.   Pt here today and states that she can tell that her symptoms are slowly improving, but she does still experience a lot of coughing, congestion, and sob with coughing especially at night. Positive for chest congestion. At night time she has to sleep propped up because otherwise she coughs non-stop and feels a lot of nasal drainage.   Taking mucinex daily with only mild relief of symptoms, also taking tessalon perrles at night which help only a bit. States a few nights ago she felt as though she was 'drowning' in her nasal congestion, and she took cetirizine and it worked like a miracle within two hours.      Past Medical History:  Diagnosis Date   Arthritis    Bursitis    back   HLD (hyperlipidemia)    HOH (hard of hearing)     Past Surgical History:  Procedure Laterality Date   BREAST CYST ASPIRATION Left 90s   benign   CATARACT EXTRACTION W/PHACO Left 03/26/2017   Procedure: CATARACT EXTRACTION PHACO AND INTRAOCULAR LENS PLACEMENT (IOC);  Surgeon: Denise Crane, MD;  Location: [facility];  Service: Ophthalmology;  Laterality: Left;   fluid pack lot #   [id number] H [place]    00:23.1 AP%  7.5 CDE   1.71    CATARACT EXTRACTION  W/PHACO Right 07/30/2017   Procedure: CATARACT EXTRACTION PHACO AND INTRAOCULAR LENS PLACEMENT (IOC);  Surgeon: Denise Crane, MD;  Location: [facility];  Service: Ophthalmology;  Laterality: Right;  fluid pack lot #  [id number] H  Exp  03/26/2018 [place]   00:32.1 AP%   5.9 CDE   1.81   COLONOSCOPY WITH PROPOFOL N/A 09/11/2017   Procedure: COLONOSCOPY WITH PROPOFOL;  Surgeon: Denise Spillers, MD;  Location: [facility];  Service: Gastroenterology;  Laterality: N/A;   TONSILLECTOMY     TUBAL LIGATION      Family History  Problem Relation Age of Onset   Heart disease Mother    Heart disease Father    Breast cancer Sister 66   Leukemia Sister    Heart attack Brother 70   Colon cancer Sister 37   Lupus Sister    Breast cancer Sister    Lupus Sister     Social History   Socioeconomic History   Marital status: Married    Spouse name: Denise Parker   Number of children: 1   Years of education: IT sales professional   Highest education level: Not on file  Occupational History   Not on file  Tobacco Use   Smoking status: Never   Smokeless tobacco: Never  Vaping Use   Vaping Use: Never used  Substance and Sexual Activity   Alcohol use: Yes    Alcohol/week: 21.0  standard drinks    Types: 21 Glasses of wine per week    Comment: Wine-regular   Drug use: No   Sexual activity: Yes    Birth control/protection: Surgical, Post-menopausal  Other Topics Concern   Not on file  Social History Narrative      Does not have a living will or HPOA   Desires CPR, would not want prolonged life support if futile.      05/30/19   From: originally from this area, but spent time in [place]   Living: with husband Denise Parker, and son and his family live upstairs   Work: retired from [employer], also use to teach [employer]      Family: Son - Denise Parker, has 5 grandchildren (2 grown in [place]) and 3 live here      Enjoys: reading, listen to books on tape, walk, hike, volunteering at the senior center       Exercise: walking - 5-7 times a week, 4 miles per day   Diet: pretty good, limits pork and beef, fish once a week      Safety   Seat belts: Yes    Guns: Yes  and secure   Safe in relationships: Yes       Social Determinants of Corporate investment banker Strain: Not on file  Food Insecurity: Not on file  Transportation Needs: Not on file  Physical Activity: Not on file  Stress: Not on file  Social Connections: Not on file  Intimate Partner Violence: Not on file    Outpatient Medications Prior to Visit  Medication Sig Dispense Refill   atorvastatin (LIPITOR) 20 MG tablet Take 1 tablet (20 mg total) by mouth daily. 90 tablet 3   benzonatate (TESSALON) 100 MG capsule Take 2 capsules (200 mg total) by mouth 3 (three) times daily as needed for cough. 40 capsule 0   Calcium Carbonate-Vit D-Min (CALTRATE 600+D PLUS MINERALS) 600-800 MG-UNIT TABS Take 2 tablets by mouth.     CRANBERRY-VITAMIN C PO Take 1 tablet by mouth daily.      docusate sodium (COLACE) 100 MG capsule Take 200 mg by mouth daily as needed for mild constipation.     ELDERBERRY PO Take 2 each by mouth daily at 12 noon.     fluticasone (FLONASE) 50 MCG/ACT nasal spray SHAKE LIQUID AND USE 1 SPRAY IN EACH NOSTRIL DAILY 16 g 6   GLUCOSAMINE CHONDROITIN COMPLX PO Take 1 tablet by mouth daily.      MELATONIN PO Take by mouth.     Menthol, Topical Analgesic, (BIOFREEZE EX) Apply 1 application topically daily as needed (bursitis in the hip and back pain).     PROLIA 60 MG/ML SOSY injection INJECT 60MG  SUBCUTANEOUSLY  EVERY 6 MONTHS 1 mL 0   Turmeric 500 MG TABS Take 500 mg by mouth 3 (three) times daily.     vitamin E 400 UNIT capsule Take 400 Units by mouth daily.     cetirizine (ZYRTEC) 5 MG tablet TAKE 2 TABLETS(10 MG) BY MOUTH DAILY (Patient taking differently: Take 10 mg by mouth as needed for allergies. TAKE 2 TABLETS(10 MG) BY MOUTH DAILY) 60 tablet 11   naproxen sodium (ALEVE) 220 MG tablet Take 220 mg by mouth at  bedtime. (Patient not taking: Reported on 01/30/2021)     baclofen (LIORESAL) 10 MG tablet Take 10 mg by mouth at bedtime as needed for muscle spasms.      predniSONE (DELTASONE) 20 MG tablet Take 2  tablets (40 mg total) by mouth daily with breakfast. 10 tablet 0   No facility-administered medications prior to visit.    Allergies  Allergen Reactions   Probiotic Product Itching and Swelling    Can't remember the name, it was a women's probiotic supplement. It cause burning, itching and swelling of the ears    ROS Review of Systems  Constitutional:  Negative for chills and fever.  HENT:  Positive for congestion, ear pain (bil ear fullness) and sore throat (horseness). Negative for sinus pressure.   Respiratory:  Negative for cough, shortness of breath and wheezing.   Cardiovascular:  Negative for chest pain and palpitations.   Review of Systems  Respiratory:  Negative for shortness of breath.   Cardiovascular:  Negative for chest pain and palpitations.  Gastrointestinal:  Negative for constipation and diarrhea.  Genitourinary:  Negative for dysuria, frequency and urgency.  Musculoskeletal:  Negative for myalgias.  Psychiatric/Behavioral:  Negative for depression and suicidal ideas.   All other systems reviewed and are negative.    Objective:    Physical Exam Constitutional:      General: She is not in acute distress.    Appearance: Normal appearance. She is not ill-appearing.  HENT:     Right Ear: No tenderness. A middle ear effusion (clear fluid) is present.     Left Ear: Tympanic membrane normal.  No middle ear effusion.     Ears:      Nose: Nose normal. No congestion or rhinorrhea.     Right Turbinates: Enlarged, swollen and pale.     Left Turbinates: Not enlarged.     Right Sinus: No maxillary sinus tenderness or frontal sinus tenderness.     Left Sinus: No maxillary sinus tenderness or frontal sinus tenderness.     Mouth/Throat:     Mouth: Mucous membranes are moist.      Pharynx: Posterior oropharyngeal erythema (cobblestone of throat) present. No pharyngeal swelling or oropharyngeal exudate.     Tonsils: No tonsillar exudate.  Eyes:     Extraocular Movements: Extraocular movements intact.     Conjunctiva/sclera: Conjunctivae normal.     Pupils: Pupils are equal, round, and reactive to light.  Neck:     Thyroid: No thyroid mass.  Cardiovascular:     Rate and Rhythm: Normal rate and regular rhythm.  Pulmonary:     Effort: Pulmonary effort is normal.     Breath sounds: Normal breath sounds.  Lymphadenopathy:     Cervical:     Right cervical: No superficial cervical adenopathy.    Left cervical: No superficial cervical adenopathy.  Neurological:     Mental Status: She is alert.    Gen: NAD, resting comfortably HEENT: TMs normal bilaterally. OP clear. No thyromegaly noted.  CV: RRR with no murmurs appreciated Pulm: NWOB, CTAB with no crackles, wheezes, or rhonchi GI: Normal bowel sounds present. Soft, Nontender, Nondistended. MSK: no edema, cyanosis, or clubbing noted Skin: warm, dry Psych: Normal affect and thought content  BP 120/78   Pulse 72   Temp (!) 97.3 F (36.3 C) (Temporal)   Ht 5' 3.5" (1.613 m)   Wt 190 lb (86.2 kg)   SpO2 97%   BMI 33.13 kg/m  Wt Readings from Last 3 Encounters:  01/30/21 190 lb (86.2 kg)  06/06/20 188 lb 12 oz (85.6 kg)  11/04/19 182 lb (82.6 kg)     Health Maintenance Due  Topic Date Due   COVID-19 Vaccine (5 - Booster for Pfizer series) 08/10/2020  TETANUS/TDAP  01/24/2021    There are no preventive care reminders to display for this patient.  Lab Results  Component Value Date   TSH 3.62 02/01/2019   Lab Results  Component Value Date   WBC 4.3 02/01/2019   HGB 12.1 02/01/2019   HCT 36.9 02/01/2019   MCV 86.2 02/01/2019   PLT 217.0 02/01/2019   Lab Results  Component Value Date   NA 139 12/27/2020   K 4.3 12/27/2020   CO2 30 12/27/2020   GLUCOSE 93 12/27/2020   BUN 14 12/27/2020    CREATININE 0.70 12/27/2020   BILITOT 0.5 06/11/2020   ALKPHOS 28 (L) 06/11/2020   AST 16 06/11/2020   ALT 12 06/11/2020   PROT 6.8 06/11/2020   ALBUMIN 4.0 06/11/2020   CALCIUM 8.8 12/27/2020   GFR 86.70 12/27/2020   Lab Results  Component Value Date   CHOL 198 06/11/2020   Lab Results  Component Value Date   HDL 74.60 06/11/2020   Lab Results  Component Value Date   LDLCALC 110 (H) 06/11/2020   Lab Results  Component Value Date   TRIG 65.0 06/11/2020   Lab Results  Component Value Date   CHOLHDL 3 06/11/2020   No results found for: HGBA1C    Assessment & Plan:   Problem List Items Addressed This Visit       Respiratory   Allergic rhinitis with postnasal drip    Start cetirizine QHS, and continue daily flonase.      Relevant Medications   cetirizine (ZYRTEC) 10 MG tablet   Bronchitis    Take antibiotic as prescribed. Increase oral fluids. Pt to f/u if sx worsen and or fail to improve in 2-3 days.       Relevant Medications   azithromycin (ZITHROMAX) 250 MG tablet     Other   Cough in adult - Primary    Cough medication as necessary, continue mucinex prn.        Meds ordered this encounter  Medications   cetirizine (ZYRTEC) 10 MG tablet    Sig: TAKE 2 TABLETS(10 MG) BY MOUTH DAILY    Dispense:  90 tablet    Refill:  1    Order Specific Question:   Supervising Provider    Answer:   BEDSOLE, AMY E [[id number]]   azithromycin (ZITHROMAX) 250 MG tablet    Sig: Take 2 tablets on day 1, then 1 tablet daily on days 2 through 5    Dispense:  6 tablet    Refill:  0    Order Specific Question:   Supervising Provider    Answer:   Ermalene Searing, AMY E [[id number]]    Follow-up: Return in about 1 week (around 02/06/2021) for only follow up if no improvement in symptoms and or worsening of symptoms.    Mort Sawyers, FNP

## 2021-01-30 NOTE — Patient Instructions (Signed)
An antibiotic was prescribed to your preferred pharmacy today, please pick up and take as directed.  Start taking cetirizine 10 mg every night along with flonase every am.   If no improvement in the next 48-72 hours please follow up   It was a pleasure seeing you today! Please do not hesitate to reach out with any questions and or concerns.  Regards,   Mort Sawyers FNP-C

## 2021-01-30 NOTE — Assessment & Plan Note (Signed)
Start cetirizine QHS, and continue daily flonase.

## 2021-01-30 NOTE — Assessment & Plan Note (Signed)
Take antibiotic as prescribed. Increase oral fluids. Pt to f/u if sx worsen and or fail to improve in 2-3 days.  

## 2021-02-23 ENCOUNTER — Other Ambulatory Visit: Payer: Self-pay | Admitting: Family Medicine

## 2021-02-23 DIAGNOSIS — E785 Hyperlipidemia, unspecified: Secondary | ICD-10-CM

## 2021-03-21 DIAGNOSIS — H04123 Dry eye syndrome of bilateral lacrimal glands: Secondary | ICD-10-CM | POA: Diagnosis not present

## 2021-05-01 ENCOUNTER — Other Ambulatory Visit: Payer: Self-pay | Admitting: Family Medicine

## 2021-06-10 ENCOUNTER — Ambulatory Visit (INDEPENDENT_AMBULATORY_CARE_PROVIDER_SITE_OTHER): Payer: Medicare Other | Admitting: Family Medicine

## 2021-06-10 VITALS — BP 110/62 | HR 70 | Temp 98.4°F | Ht 63.25 in | Wt 198.4 lb

## 2021-06-10 DIAGNOSIS — R0982 Postnasal drip: Secondary | ICD-10-CM | POA: Diagnosis not present

## 2021-06-10 DIAGNOSIS — Z0000 Encounter for general adult medical examination without abnormal findings: Secondary | ICD-10-CM

## 2021-06-10 DIAGNOSIS — J309 Allergic rhinitis, unspecified: Secondary | ICD-10-CM

## 2021-06-10 DIAGNOSIS — E785 Hyperlipidemia, unspecified: Secondary | ICD-10-CM

## 2021-06-10 DIAGNOSIS — M810 Age-related osteoporosis without current pathological fracture: Secondary | ICD-10-CM

## 2021-06-10 MED ORDER — FLUTICASONE PROPIONATE 50 MCG/ACT NA SUSP: NASAL | 6 refills | Status: DC

## 2021-06-10 MED ORDER — CETIRIZINE HCL 10 MG PO TABS: ORAL_TABLET | ORAL | 1 refills | Status: DC

## 2021-06-10 MED ORDER — ATORVASTATIN CALCIUM 20 MG PO TABS: 20.0000 mg | ORAL_TABLET | Freq: Every day | ORAL | 3 refills | Status: DC

## 2021-06-10 NOTE — Patient Instructions (Addendum)
Continue healthy diet and regular exercise ? ?#Referral ?I have placed a referral to a specialist for you. You should receive a phone call from the specialty office. Make sure your voicemail is not full and that if you are able to answer your phone to unknown or new numbers.  ? ?It may take up to 2 weeks to hear about the referral. If you do not hear anything in 2 weeks, please call our office and ask to speak with the referral coordinator.  ? ?

## 2021-06-10 NOTE — Progress Notes (Signed)
? ?Subjective:  ? Denise Parker is a 71 y.o. female who presents for Medicare Annual (Subsequent) preventive examination. ? ?Review of Systems    ?Review of Systems  ?Constitutional:  Negative for chills and fever.  ?HENT:  Negative for congestion and sore throat.   ?Eyes:  Negative for blurred vision and double vision.  ?Respiratory:  Negative for shortness of breath.   ?Cardiovascular:  Negative for chest pain.  ?Gastrointestinal:  Negative for heartburn, nausea and vomiting.  ?Genitourinary: Negative.   ?Musculoskeletal: Negative.  Negative for myalgias.  ?Skin:  Negative for rash.  ?Neurological:  Negative for dizziness and headaches.  ?Endo/Heme/Allergies:  Does not bruise/bleed easily.  ?Psychiatric/Behavioral:  Negative for depression. The patient is not nervous/anxious.   ? ?Cardiac Risk Factors include: advanced age (>69men, >54 women);obesity (BMI >30kg/m2);dyslipidemia ? ?   ?Objective:  ?  ?Today's Vitals  ? 06/10/21 0916  ?BP: 110/62  ?Pulse: 70  ?Temp: 98.4 ?F (36.9 ?C)  ?TempSrc: Oral  ?SpO2: 100%  ?Weight: 198 lb 6 oz (90 kg)  ?Height: 5' 3.25" (1.607 m)  ? ?Body mass index is 34.86 kg/m?. ? ? ?  06/10/2021  ?  9:26 AM 06/06/2020  ?  9:04 AM 09/11/2017  ?  8:30 AM 07/30/2017  ? 10:30 AM 07/08/2017  ?  1:56 PM 07/29/2016  ?  1:37 PM 07/07/2016  ? 10:05 AM  ?Advanced Directives  ?Does Patient Have a Medical Advance Directive? No No No No No No No  ?Would patient like information on creating a medical advance directive? Yes (MAU/Ambulatory/Procedural Areas - Information given) Yes (MAU/Ambulatory/Procedural Areas - Information given) No - Patient declined  Yes (MAU/Ambulatory/Procedural Areas - Information given) No - Patient declined   ? ? ?Current Medications (verified) ?Outpatient Encounter Medications as of 06/10/2021  ?Medication Sig  ? Calcium Carbonate-Vit D-Min (CALTRATE 600+D PLUS MINERALS) 600-800 MG-UNIT TABS Take 2 tablets by mouth.  ? CRANBERRY-VITAMIN C PO Take 1 tablet by mouth daily.   ?  docusate sodium (COLACE) 100 MG capsule Take 200 mg by mouth daily as needed for mild constipation.  ? ELDERBERRY PO Take 2 each by mouth daily at 12 noon.  ? fluorometholone (FML) 0.1 % ophthalmic suspension Place 1 drop into both eyes daily.  ? GLUCOSAMINE CHONDROITIN COMPLX PO Take 1 tablet by mouth daily.   ? MELATONIN PO Take by mouth.  ? Menthol, Topical Analgesic, (BIOFREEZE EX) Apply 1 application topically daily as needed (bursitis in the hip and back pain).  ? PROLIA 60 MG/ML SOSY injection INJECT 60MG  SUBCUTANEOUSLY  EVERY 6 MONTHS  ? Turmeric 500 MG TABS Take 500 mg by mouth 3 (three) times daily.  ? vitamin E 400 UNIT capsule Take 400 Units by mouth daily.  ? [DISCONTINUED] atorvastatin (LIPITOR) 20 MG tablet Take 1 tablet (20 mg total) by mouth daily.  ? [DISCONTINUED] cetirizine (ZYRTEC) 10 MG tablet TAKE 2 TABLETS(10 MG) BY MOUTH DAILY  ? [DISCONTINUED] fluticasone (FLONASE) 50 MCG/ACT nasal spray SHAKE LIQUID AND USE 1 SPRAY IN EACH NOSTRIL DAILY  ? atorvastatin (LIPITOR) 20 MG tablet Take 1 tablet (20 mg total) by mouth daily.  ? cetirizine (ZYRTEC) 10 MG tablet TAKE 1 TABLET (10 MG) BY MOUTH DAILY  ? fluticasone (FLONASE) 50 MCG/ACT nasal spray SHAKE LIQUID AND USE 1 SPRAY IN EACH NOSTRIL DAILY  ? [DISCONTINUED] benzonatate (TESSALON) 100 MG capsule Take 2 capsules (200 mg total) by mouth 3 (three) times daily as needed for cough.  ? [DISCONTINUED] naproxen sodium (ALEVE)  220 MG tablet Take 220 mg by mouth at bedtime. (Patient not taking: Reported on 01/30/2021)  ? ?No facility-administered encounter medications on file as of 06/10/2021.  ? ? ?Allergies (verified) ?Probiotic product  ? ?History: ?Past Medical History:  ?Diagnosis Date  ? Arthritis   ? Bursitis   ? back  ? HLD (hyperlipidemia)   ? HOH (hard of hearing)   ? ?Past Surgical History:  ?Procedure Laterality Date  ? BREAST CYST ASPIRATION Left 90s  ? benign  ? CATARACT EXTRACTION W/PHACO Left 03/26/2017  ? Procedure: CATARACT EXTRACTION  PHACO AND INTRAOCULAR LENS PLACEMENT (IOC);  Surgeon: Eulogio Bear, MD;  Location: [facility] ORS;  Service: Ophthalmology;  Laterality: Left;   fluid pack lot #   [id number] ?[place]    00:23.1 ?AP%  7.5 ?CDE   1.71 ?  ? CATARACT EXTRACTION W/PHACO Right 07/30/2017  ? Procedure: CATARACT EXTRACTION PHACO AND INTRAOCULAR LENS PLACEMENT (IOC);  Surgeon: Eulogio Bear, MD;  Location: [facility] ORS;  Service: Ophthalmology;  Laterality: Right;  fluid pack lot #  [id number]  Exp  03/26/2018 ?[place]   00:32.1 ?AP%   5.9 ?CDE   1.81  ? COLONOSCOPY WITH PROPOFOL N/A 09/11/2017  ? Procedure: COLONOSCOPY WITH PROPOFOL;  Surgeon: Virgel Manifold, MD;  Location: [facility] ENDOSCOPY;  Service: Gastroenterology;  Laterality: N/A;  ? TONSILLECTOMY    ? TUBAL LIGATION    ? ?Family History  ?Problem Relation Age of Onset  ? Heart disease Mother   ? Heart disease Father   ? Breast cancer Sister 34  ? Leukemia Sister   ? Heart attack Brother 48  ? Colon cancer Sister 63  ? Lupus Sister   ? Breast cancer Sister   ? Lupus Sister   ? ?Social History  ? ?Socioeconomic History  ? Marital status: Married  ?  Spouse name: Shanon Brow  ? Number of children: 1  ? Years of education: Restaurant manager, fast food degree  ? Highest education level: Not on file  ?Occupational History  ? Not on file  ?Tobacco Use  ? Smoking status: Never  ? Smokeless tobacco: Never  ?Vaping Use  ? Vaping Use: Never used  ?Substance and Sexual Activity  ? Alcohol use: Yes  ?  Alcohol/week: 21.0 standard drinks  ?  Types: 21 Glasses of wine per week  ?  Comment: Wine-regular  ? Drug use: No  ? Sexual activity: Yes  ?  Birth control/protection: Surgical, Post-menopausal  ?Other Topics Concern  ? Not on file  ?Social History Narrative  ?   ? Does not have a living will or HPOA  ? Desires CPR, would not want prolonged life support if futile.  ?   ? 05/30/19  ? From: originally from this area, but spent time in [place]  ? Living: with husband Shanon Brow, and son and his family live upstairs  ? 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   ? Family: Son - Shanon Brow, has 5 grandchildren (2 grown in [place]) and 3 live here  ?   ? Enjoys: reading, listen to books on tape, walk, hike, volunteering at the senior center  ?   ? Exercise: walking - 5-7 times a week, 4 miles per day  ? Diet: pretty good, limits pork and beef, fish once a week  ?   ? Safety  ? Seat belts: Yes   ? Guns: Yes  and secure  ? Safe in relationships: Yes   ?   ? ?  Social Determinants of Health  ? ?Financial Resource Strain: Not on file  ?Food Insecurity: Not on file  ?Transportation Needs: Not on file  ?Physical Activity: Not on file  ?Stress: Not on file  ?Social Connections: Not on file  ? ? ?Tobacco Counseling ?Counseling given: Not Answered ? ? ?Clinical Intake: ? ?Pre-visit preparation completed: No ? ?Pain : No/denies pain ? ?  ? ?BMI - recorded: 34.86 ?Nutritional Status: BMI > 30  Obese ?Nutritional Risks: None ?Diabetes: No ? ?How often do you need to have someone help you when you read instructions, pamphlets, or other written materials from your doctor or pharmacy?: 1 - Never ?What is the last grade level you completed in school?: graduate school ? ?Diabetic? no ? ?Interpreter Needed?: No ? ?  ? ? ?Activities of Daily Living ? ?  06/10/2021  ?  9:27 AM  ?In your present state of health, do you have any difficulty performing the following activities:  ?Hearing? 1  ?Comment hearing aids  ?Vision? 0  ?Difficulty concentrating or making decisions? 0  ?Walking or climbing stairs? 0  ?Dressing or bathing? 0  ?Doing errands, shopping? 0  ?Preparing Food and eating ? N  ?Using the Toilet? N  ?In the past six months, have you accidently leaked urine? N  ?Do you have problems with loss of bowel control? N  ?Managing your Medications? N  ?Managing your Finances? N  ?Housekeeping or managing your Housekeeping? N  ? ? ?Patient Care Team: ?Lesleigh Noe, MD as PCP - General (Family Medicine) ?Philis Kendall, MD as Consulting Physician  (Unknown Physician Specialty) ? ?Indicate any recent Medical Services you may have received from other than [facility] providers in the past year (date may be approximate). ? ?   ?Assessment:  ? This is a routine

## 2021-06-10 NOTE — Assessment & Plan Note (Signed)
She is on Prolia, though minimal improvement on last DEXA.  Referral to endocrine to see if there is any more we could be doing. ?

## 2021-06-11 ENCOUNTER — Encounter: Payer: Self-pay | Admitting: Family Medicine

## 2021-06-11 ENCOUNTER — Other Ambulatory Visit (INDEPENDENT_AMBULATORY_CARE_PROVIDER_SITE_OTHER): Payer: Medicare Other

## 2021-06-11 DIAGNOSIS — M810 Age-related osteoporosis without current pathological fracture: Secondary | ICD-10-CM | POA: Diagnosis not present

## 2021-06-11 DIAGNOSIS — E785 Hyperlipidemia, unspecified: Secondary | ICD-10-CM | POA: Diagnosis not present

## 2021-06-11 LAB — COMPREHENSIVE METABOLIC PANEL
ALT: 14 U/L (ref 0–35)
AST: 14 U/L (ref 0–37)
Albumin: 4.2 g/dL (ref 3.5–5.2)
Alkaline Phosphatase: 41 U/L (ref 39–117)
BUN: 7 mg/dL (ref 6–23)
CO2: 27 mEq/L (ref 19–32)
Calcium: 8.8 mg/dL (ref 8.4–10.5)
Chloride: 103 mEq/L (ref 96–112)
Creatinine, Ser: 0.64 mg/dL (ref 0.40–1.20)
GFR: 88.31 mL/min (ref >60.00)
Glucose, Bld: 87 mg/dL (ref 70–99)
Potassium: 4.1 mEq/L (ref 3.5–5.1)
Sodium: 139 mEq/L (ref 135–145)
Total Bilirubin: 0.7 mg/dL (ref 0.2–1.2)
Total Protein: 7.2 g/dL (ref 6.0–8.3)

## 2021-06-11 LAB — LIPID PANEL
Cholesterol: 206 mg/dL — ABNORMAL HIGH (ref 0–200)
HDL: 69.5 mg/dL (ref >39.00)
LDL Cholesterol: 121 mg/dL — ABNORMAL HIGH (ref 0–99)
NonHDL: 136.99
Total CHOL/HDL Ratio: 3
Triglycerides: 80 mg/dL (ref 0.0–149.0)
VLDL: 16 mg/dL (ref 0.0–40.0)

## 2021-06-11 LAB — VITAMIN D 25 HYDROXY (VIT D DEFICIENCY, FRACTURES): VITD: 34.69 ng/mL (ref 30.00–100.00)

## 2021-06-15 ENCOUNTER — Other Ambulatory Visit: Payer: Self-pay | Admitting: Family Medicine

## 2021-06-15 DIAGNOSIS — M810 Age-related osteoporosis without current pathological fracture: Secondary | ICD-10-CM

## 2021-06-21 ENCOUNTER — Encounter: Payer: Self-pay | Admitting: Family Medicine

## 2021-06-21 ENCOUNTER — Telehealth: Payer: Self-pay

## 2021-06-21 NOTE — Telephone Encounter (Signed)
Spoke with patient. Scheduled NV for 07/11/21. Patient stated she spoke with [facility] today and was told they did not have a RX from [place] for Prolia that I sent in on 06/18/21. Patient advised that I will follow up on this. ?Labs were done on 06/11/21 ?

## 2021-06-21 NOTE — Telephone Encounter (Signed)
See phone note

## 2021-06-21 NOTE — Telephone Encounter (Signed)
Patient called states she is due for Prolia in May. She is going out of town a lot and will be here the week of 15. She also get her prolia from mail order.  ?

## 2021-06-21 NOTE — Telephone Encounter (Signed)
Spoke with [facility] no issues with the RX, scheduled delivery for 06/25/21 ?Labs done on 06/11/21 Calcium normal at 8.8. CrCl is 112.89 mL/min ?

## 2021-07-11 ENCOUNTER — Ambulatory Visit (INDEPENDENT_AMBULATORY_CARE_PROVIDER_SITE_OTHER): Payer: Medicare Other

## 2021-07-11 DIAGNOSIS — M810 Age-related osteoporosis without current pathological fracture: Secondary | ICD-10-CM | POA: Diagnosis not present

## 2021-07-11 MED ORDER — DENOSUMAB 60 MG/ML ~~LOC~~ SOSY
60.0000 mg | PREFILLED_SYRINGE | Freq: Once | SUBCUTANEOUS | Status: AC
  Administered 2021-07-11: 60 mg via SUBCUTANEOUS

## 2021-07-11 NOTE — Progress Notes (Signed)
Per orders of Dr. Letvak, injection of Prolia given by Shemeca Lukasik. Patient tolerated injection well.  

## 2021-07-25 DIAGNOSIS — H43811 Vitreous degeneration, right eye: Secondary | ICD-10-CM | POA: Diagnosis not present

## 2021-08-13 ENCOUNTER — Other Ambulatory Visit (INDEPENDENT_AMBULATORY_CARE_PROVIDER_SITE_OTHER): Payer: Medicare Other

## 2021-08-13 DIAGNOSIS — E785 Hyperlipidemia, unspecified: Secondary | ICD-10-CM | POA: Diagnosis not present

## 2021-08-13 LAB — LIPID PANEL
Cholesterol: 181 mg/dL (ref 0–200)
HDL: 62.9 mg/dL (ref >39.00)
LDL Cholesterol: 104 mg/dL — ABNORMAL HIGH (ref 0–99)
NonHDL: 118.46
Total CHOL/HDL Ratio: 3
Triglycerides: 70 mg/dL (ref 0.0–149.0)
VLDL: 14 mg/dL (ref 0.0–40.0)

## 2021-08-15 ENCOUNTER — Encounter: Payer: Self-pay | Admitting: Family Medicine

## 2021-09-26 ENCOUNTER — Other Ambulatory Visit: Payer: Self-pay | Admitting: Family Medicine

## 2021-09-26 DIAGNOSIS — H04123 Dry eye syndrome of bilateral lacrimal glands: Secondary | ICD-10-CM | POA: Diagnosis not present

## 2021-09-26 DIAGNOSIS — Z1231 Encounter for screening mammogram for malignant neoplasm of breast: Secondary | ICD-10-CM

## 2021-10-24 ENCOUNTER — Ambulatory Visit
Admission: RE | Admit: 2021-10-24 | Discharge: 2021-10-24 | Disposition: A | Discharge status: Home / Self Care | Payer: Medicare Other | Source: Ambulatory Visit | Attending: Family Medicine | Admitting: Family Medicine

## 2021-10-24 DIAGNOSIS — Z1231 Encounter for screening mammogram for malignant neoplasm of breast: Secondary | ICD-10-CM | POA: Insufficient documentation

## 2021-11-07 ENCOUNTER — Telehealth: Payer: Self-pay | Admitting: Family Medicine

## 2021-11-07 NOTE — Telephone Encounter (Signed)
Patient called in and was wondering if Vernona Rieger would take her on as a new patient. She stated that her husband Starlynn Klinkner is her patient and would like to transfer over to her, if she will allow. Please advise. Thank you!

## 2021-11-08 NOTE — Telephone Encounter (Signed)
Please notify patient that I would be happy to accept her. Please set up for [facility] appointment at her convenience.

## 2021-11-08 NOTE — Telephone Encounter (Signed)
LVM for patient to return call. 

## 2021-11-14 ENCOUNTER — Ambulatory Visit (INDEPENDENT_AMBULATORY_CARE_PROVIDER_SITE_OTHER): Payer: Medicare Other

## 2021-11-14 DIAGNOSIS — Z23 Encounter for immunization: Secondary | ICD-10-CM

## 2021-11-21 ENCOUNTER — Ambulatory Visit (INDEPENDENT_AMBULATORY_CARE_PROVIDER_SITE_OTHER): Payer: Medicare Other | Admitting: Primary Care

## 2021-11-21 ENCOUNTER — Encounter: Payer: Self-pay | Admitting: Primary Care

## 2021-11-21 VITALS — BP 140/68 | HR 60 | Temp 97.3°F | Ht 63.25 in | Wt 193.0 lb

## 2021-11-21 DIAGNOSIS — R030 Elevated blood-pressure reading, without diagnosis of hypertension: Secondary | ICD-10-CM | POA: Diagnosis not present

## 2021-11-21 DIAGNOSIS — J309 Allergic rhinitis, unspecified: Secondary | ICD-10-CM

## 2021-11-21 DIAGNOSIS — G2581 Restless legs syndrome: Secondary | ICD-10-CM | POA: Diagnosis not present

## 2021-11-21 DIAGNOSIS — E785 Hyperlipidemia, unspecified: Secondary | ICD-10-CM | POA: Diagnosis not present

## 2021-11-21 DIAGNOSIS — K219 Gastro-esophageal reflux disease without esophagitis: Secondary | ICD-10-CM | POA: Diagnosis not present

## 2021-11-21 DIAGNOSIS — F1090 Alcohol use, unspecified, uncomplicated: Secondary | ICD-10-CM

## 2021-11-21 DIAGNOSIS — R350 Frequency of micturition: Secondary | ICD-10-CM | POA: Diagnosis not present

## 2021-11-21 DIAGNOSIS — M810 Age-related osteoporosis without current pathological fracture: Secondary | ICD-10-CM | POA: Diagnosis not present

## 2021-11-21 DIAGNOSIS — R0982 Postnasal drip: Secondary | ICD-10-CM

## 2021-11-21 HISTORY — DX: Frequency of micturition: R35.0

## 2021-11-21 LAB — POC URINALSYSI DIPSTICK (AUTOMATED)
Bilirubin, UA: NEGATIVE
Blood, UA: NEGATIVE
Glucose, UA: NEGATIVE
Ketones, UA: NEGATIVE
Leukocytes, UA: NEGATIVE
Nitrite, UA: NEGATIVE
Protein, UA: NEGATIVE
Spec Grav, UA: <=1.005 — AB (ref 1.010–1.025)
Urobilinogen, UA: 0.2 E.U./dL
pH, UA: 7.5 (ref 5.0–8.0)

## 2021-11-21 NOTE — Assessment & Plan Note (Signed)
Reviewed bone density scan from 2022 which has improved.  Continue Prolia 60 mg injections semi-annually. She will follow up with endocrinology in February 2024 to determine if she needs more aggressive treatment.

## 2021-11-21 NOTE — Assessment & Plan Note (Addendum)
Controlled.  Continue Zyrtec 10 mg daily.  Continue Flonase daily.

## 2021-11-21 NOTE — Assessment & Plan Note (Signed)
Above goal in the office today, also on recheck.  Discussed to monitor BP at home, report if readings are consistently at or above 140/90. She has no prior diagnosis of hypertension, however, she does have a significant FH of heart disease.  Continue to monitor.

## 2021-11-21 NOTE — Assessment & Plan Note (Signed)
Reduced to 2 small glasses nightly. Continue to monitor.

## 2021-11-21 NOTE — Assessment & Plan Note (Signed)
Intermittent, occurring at night. Overall controlled and infrequent.  No concerns today.

## 2021-11-21 NOTE — Assessment & Plan Note (Addendum)
Improving.  UA today negative.  Discussed good bedtime habits including limiting liquids before bed, limit caffeine and foods that can irritate the bladder. Limit alcohol and other diuretics.   Fortunately, symptoms are improving.  No history of recurrent cystitis.  Consider Urology referral if symptoms persist.

## 2021-11-21 NOTE — Assessment & Plan Note (Signed)
Reviewed lipid panel from April and June 2023.  Continue atorvastatin 20 mg. Repeat lipid panel pending.   Commended her on dietary changes.

## 2021-11-21 NOTE — Assessment & Plan Note (Signed)
Controlled since dietary changes.  Continue to monitor.

## 2021-11-21 NOTE — Patient Instructions (Addendum)
Schedule a lab appointment to check your cholesterol.   Continue with Prolia injections twice annually.   Start monitoring your blood pressure daily, around the same time of day, for the next 2-3 weeks.  Ensure that you have rested for 30 minutes prior to checking your blood pressure.   Record your readings and notify me if you see numbers consistently at or above 130 on top and/or 90 on bottom.  It was a pleasure meeting you!

## 2021-11-21 NOTE — Progress Notes (Signed)
Subjective:    Patient ID: Denise Parker, female    DOB: 1950/12/29, 71 y.o.   MRN: [id number]  HPI  Denise Parker is a very pleasant 71 y.o. female of Dr. Selena Batten who presents today for transfer of care visit.  1) Osteoporosis: Currently managed on Prolia injections 60 mg semi-annually. Last bone density scan was in August of 2022 with normal reading to right femur and osteoporosis to left forearm. T score of -3.5 which had improved from -3.8 in 2020.  She was referred to endocrinology by per prior PCP as her bone density had not improved as much as expected.   2) Hyperlipidemia: Currently managed on atorvastatin 20 mg daily for which she's been taking since early 2000's. Last lipid panel was in June 2023 with LDL of 104, HDL of 62.  She has a significant family history of heart disease in her family including her 20 year old brother.   She is working on lifestyle changes in an attempt to lower her cholesterol. Since her last visit she's changed her diet, she is non dairy and 80% plant based. She is walking, does yoga, dances for exercise.   3) Urinary Frequency: Acute for the last 2 weeks. Occurs at night only, getting up 2-4 times every night. Also with pelvic discomfort. She increased her water intake and has noticed improvement. She denies dysuria, hematuria, vaginal itching/discharge.   BP Readings from Last 3 Encounters:  11/21/21 (!) 140/68  06/10/21 110/62  01/30/21 120/78        Review of Systems  Respiratory:  Negative for shortness of breath.   Cardiovascular:  Negative for chest pain.  Gastrointestinal:  Negative for constipation.  Genitourinary:  Positive for frequency. Negative for dysuria, hematuria, vaginal bleeding and vaginal discharge.         Past Medical History:  Diagnosis Date   Arthritis    Bronchitis 01/30/2021   Bursitis    back   Cough in adult 01/30/2021   Herpes zoster 04/13/2013   HLD (hyperlipidemia)    HOH (hard of hearing)      Social History   Socioeconomic History   Marital status: Married    Spouse name: [name]   Number of children: 1   Years of education: IT sales professional   Highest education level: Not on file  Occupational History   Not on file  Tobacco Use   Smoking status: Never   Smokeless tobacco: Never  Vaping Use   Vaping Use: Never used  Substance and Sexual Activity   Alcohol use: Yes    Alcohol/week: 21.0 standard drinks of alcohol    Types: 21 Glasses of wine per week    Comment: Wine-regular   Drug use: No   Sexual activity: Yes    Birth control/protection: Surgical, Post-menopausal  Other Topics Concern   Not on file  Social History Narrative      Does not have a living will or HPOA   Desires CPR, would not want prolonged life support if futile.      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[employer]      Family: Son - [name], has 5 grandchildren (2 grown in [place]) and 3 live here      Enjoys: reading, listen to books on tape, walk, hike, volunteering at the  senior center      Exercise: walking - 5-7 times a week, 4 miles per day   Diet: pretty good, limits pork and beef, fish once a week      Safety   Seat belts: Yes    Guns: Yes  and secure   Safe in relationships: Yes       Social Determinants of Health   Financial Resource Strain: Not on file  Food Insecurity: Not on file  Transportation Needs: Not on file  Physical Activity: Not on file  Stress: Not on file  Social Connections: Not on file  Intimate Partner Violence: Not on file    Past Surgical History:  Procedure Laterality Date   BREAST CYST ASPIRATION Left 90s   benign   CATARACT EXTRACTION W/PHACO Left 03/26/2017   Procedure: CATARACT EXTRACTION PHACO AND INTRAOCULAR LENS PLACEMENT (IOC);  Surgeon: Nevada Crane, MD;  Location: [facility]  ORS;  Service: Ophthalmology;  Laterality: Left;   fluid pack lot #   [id number] [place]    00:23.1 AP%  7.5 CDE   1.71    CATARACT EXTRACTION W/PHACO Right 07/30/2017   Procedure: CATARACT EXTRACTION PHACO AND INTRAOCULAR LENS PLACEMENT (IOC);  Surgeon: Nevada Crane, MD;  Location: [facility] ORS;  Service: Ophthalmology;  Laterality: Right;  fluid pack lot #  [id number]  Exp  03/26/2018 [place]   00:32.1 AP%   5.9 CDE   1.81   COLONOSCOPY WITH PROPOFOL N/A 09/11/2017   Procedure: COLONOSCOPY WITH PROPOFOL;  Surgeon: Pasty Spillers, MD;  Location: [facility] ENDOSCOPY;  Service: Gastroenterology;  Laterality: N/A;   TONSILLECTOMY     TUBAL LIGATION      Family History  Problem Relation Age of Onset   Heart disease Mother    Stroke Mother    Heart disease Father    Lupus Father    Breast cancer Sister 51   Leukemia Sister    Colon cancer Sister 7   Lupus Sister    Breast cancer Sister    Lupus Sister    Heart attack Brother 28    Allergies  Allergen Reactions   Probiotic Product Itching and Swelling    Can't remember the name, it was a women's probiotic supplement. It cause burning, itching and swelling of the ears    Current Outpatient Medications on File Prior to Visit  Medication Sig Dispense Refill   atorvastatin (LIPITOR) 20 MG tablet Take 1 tablet (20 mg total) by mouth daily. 90 tablet 3   Calcium Carbonate-Vit D-Min (CALTRATE 600+D PLUS MINERALS) 600-800 MG-UNIT TABS Take 2 tablets by mouth.     cetirizine (ZYRTEC) 10 MG tablet TAKE 1 TABLET (10 MG) BY MOUTH DAILY 90 tablet 1   CRANBERRY-VITAMIN C PO Take 1 tablet by mouth daily.      ELDERBERRY PO Take 2 each by mouth daily at 12 noon.     fluorometholone (FML) 0.1 % ophthalmic suspension Place 1 drop into both eyes daily.     fluticasone (FLONASE) 50 MCG/ACT nasal spray SHAKE LIQUID AND USE 1 SPRAY IN EACH NOSTRIL DAILY 16 g 6   GLUCOSAMINE CHONDROITIN COMPLX PO Take 1 tablet by mouth daily.      MELATONIN PO Take by mouth.      Menthol, Topical Analgesic, (BIOFREEZE EX) Apply 1 application topically daily as needed (bursitis in the hip and back pain).     PROLIA 60 MG/ML SOSY injection INJECT 60MG  SUBCUTANEOUSLY EVERY 6 MONTHS 1 mL 0   Turmeric 500 MG  TABS Take 500 mg by mouth 3 (three) times daily.     vitamin E 400 UNIT capsule Take 400 Units by mouth daily.     docusate sodium (COLACE) 100 MG capsule Take 200 mg by mouth daily as needed for mild constipation.     No current facility-administered medications on file prior to visit.    BP (!) 140/68   Pulse 60   Temp (!) 97.3 F (36.3 C) (Temporal)   Ht 5' 3.25" (1.607 m)   Wt 193 lb (87.5 kg)   SpO2 99%   BMI 33.92 kg/m  Objective:   Physical Exam Cardiovascular:     Rate and Rhythm: Normal rate and regular rhythm.  Pulmonary:     Effort: Pulmonary effort is normal.     Breath sounds: Normal breath sounds.  Musculoskeletal:     Cervical back: Neck supple.  Skin:    General: Skin is warm and dry.  Neurological:     Mental Status: She is alert and oriented to person, place, and time.  Psychiatric:        Mood and Affect: Mood normal.           Assessment & Plan:   Problem List Items Addressed This Visit       Respiratory   Allergic rhinitis with postnasal drip    Controlled.  Continue Zyrtec 10 mg daily.  Continue Flonase daily.        Digestive   GERD (gastroesophageal reflux disease)    Controlled since dietary changes.  Continue to monitor.         Musculoskeletal and Integument   Osteoporosis    Reviewed bone density scan from 2022 which has improved.  Continue Prolia 60 mg injections semi-annually. She will follow up with endocrinology in February 2024 to determine if she needs more aggressive treatment.         Other   HLD (hyperlipidemia) - Primary    Reviewed lipid panel from April and June 2023.  Continue atorvastatin 20 mg. Repeat lipid panel pending.   Commended her on dietary changes.        Relevant Orders   Lipid panel   Heavy alcohol use    Reduced to 2 small glasses nightly. Continue to monitor.       Restless legs    Intermittent, occurring at night. Overall controlled and infrequent.  No concerns today.      Urinary frequency    Improving.  UA today negative.  Discussed good bedtime habits including limiting liquids before bed, limit caffeine and foods that can irritate the bladder. Limit alcohol and other diuretics.   Fortunately, symptoms are improving.  No history of recurrent cystitis.  Consider Urology referral if symptoms persist.       Relevant Orders   POCT Urinalysis Dipstick (Automated) (Completed)   Elevated blood pressure reading in office without diagnosis of hypertension    Above goal in the office today, also on recheck.  Discussed to monitor BP at home, report if readings are consistently at or above 140/90. She has no prior diagnosis of hypertension, however, she does have a significant FH of heart disease.  Continue to monitor.           Pleas Koch, NP

## 2021-11-27 ENCOUNTER — Other Ambulatory Visit (INDEPENDENT_AMBULATORY_CARE_PROVIDER_SITE_OTHER): Payer: Medicare Other

## 2021-11-27 DIAGNOSIS — E785 Hyperlipidemia, unspecified: Secondary | ICD-10-CM | POA: Diagnosis not present

## 2021-11-27 LAB — LIPID PANEL
Cholesterol: 187 mg/dL (ref 0–200)
HDL: 63.6 mg/dL (ref >39.00)
LDL Cholesterol: 107 mg/dL — ABNORMAL HIGH (ref 0–99)
NonHDL: 123.72
Total CHOL/HDL Ratio: 3
Triglycerides: 86 mg/dL (ref 0.0–149.0)
VLDL: 17.2 mg/dL (ref 0.0–40.0)

## 2021-12-04 DIAGNOSIS — Z23 Encounter for immunization: Secondary | ICD-10-CM | POA: Diagnosis not present

## 2021-12-10 ENCOUNTER — Other Ambulatory Visit: Payer: Self-pay

## 2021-12-10 DIAGNOSIS — M810 Age-related osteoporosis without current pathological fracture: Secondary | ICD-10-CM

## 2021-12-10 MED ORDER — PROLIA 60 MG/ML ~~LOC~~ SOSY: PREFILLED_SYRINGE | SUBCUTANEOUS | 0 refills | Status: DC

## 2021-12-10 NOTE — Telephone Encounter (Signed)
Fax from [facility] to refill Prolia injection

## 2021-12-18 ENCOUNTER — Telehealth: Payer: Self-pay

## 2021-12-18 NOTE — Telephone Encounter (Signed)
Sam with [facility] confirmed delivery of Prolia by 12/20/21

## 2021-12-18 NOTE — Telephone Encounter (Signed)
Patient gets RX for Prolia thru [facility] RX was sent on 12/10/21 already. Will call them to make sure delivery gets done to our office. Patient advised. Next injection due 01/12/22 or after.  Lab on 01/03/22 and NV 01/13/22

## 2021-12-23 ENCOUNTER — Other Ambulatory Visit: Payer: Self-pay | Admitting: Primary Care

## 2021-12-23 DIAGNOSIS — M810 Age-related osteoporosis without current pathological fracture: Secondary | ICD-10-CM

## 2022-01-03 ENCOUNTER — Other Ambulatory Visit (INDEPENDENT_AMBULATORY_CARE_PROVIDER_SITE_OTHER): Payer: Medicare Other

## 2022-01-03 DIAGNOSIS — M810 Age-related osteoporosis without current pathological fracture: Secondary | ICD-10-CM | POA: Diagnosis not present

## 2022-01-03 LAB — BASIC METABOLIC PANEL
BUN: 10 mg/dL (ref 6–23)
CO2: 31 mEq/L (ref 19–32)
Calcium: 8.4 mg/dL (ref 8.4–10.5)
Chloride: 105 mEq/L (ref 96–112)
Creatinine, Ser: 0.66 mg/dL (ref 0.40–1.20)
GFR: 87.31 mL/min (ref >60.00)
Glucose, Bld: 82 mg/dL (ref 70–99)
Potassium: 3.8 mEq/L (ref 3.5–5.1)
Sodium: 140 mEq/L (ref 135–145)

## 2022-01-13 ENCOUNTER — Ambulatory Visit (INDEPENDENT_AMBULATORY_CARE_PROVIDER_SITE_OTHER): Payer: Medicare Other

## 2022-01-13 DIAGNOSIS — M810 Age-related osteoporosis without current pathological fracture: Secondary | ICD-10-CM

## 2022-01-13 MED ORDER — DENOSUMAB 60 MG/ML ~~LOC~~ SOSY
60.0000 mg | PREFILLED_SYRINGE | Freq: Once | SUBCUTANEOUS | Status: AC
  Administered 2022-01-13: 60 mg via SUBCUTANEOUS

## 2022-01-13 NOTE — Progress Notes (Signed)
Per orders of Katherine Clark, NP injection of Prolia given by Neeley Sedivy V Eldonna Neuenfeldt. Patient tolerated injection well. 

## 2022-01-13 NOTE — Telephone Encounter (Signed)
CrCl is 106.43 mL/min. Calcium normal 8.4

## 2022-01-29 ENCOUNTER — Telehealth: Payer: Self-pay

## 2022-01-29 NOTE — Telephone Encounter (Signed)
Last Prolia inj: 01/13/22 Next Prolia inj DUE: 07/13/22

## 2022-04-01 ENCOUNTER — Telehealth: Payer: Self-pay

## 2022-04-01 DIAGNOSIS — J309 Allergic rhinitis, unspecified: Secondary | ICD-10-CM

## 2022-04-01 MED ORDER — CETIRIZINE HCL 10 MG PO TABS: 10.0000 mg | ORAL_TABLET | Freq: Every day | ORAL | 1 refills | Status: AC | PRN

## 2022-04-01 NOTE — Telephone Encounter (Signed)
Refills sent to pharmacy. 

## 2022-04-01 NOTE — Telephone Encounter (Signed)
Received refill request for    

## 2022-04-02 ENCOUNTER — Encounter: Payer: Self-pay | Admitting: Internal Medicine

## 2022-04-02 ENCOUNTER — Ambulatory Visit (INDEPENDENT_AMBULATORY_CARE_PROVIDER_SITE_OTHER): Payer: Medicare Other | Admitting: Internal Medicine

## 2022-04-02 VITALS — BP 116/70 | HR 58 | Ht 63.25 in | Wt 201.0 lb

## 2022-04-02 DIAGNOSIS — M810 Age-related osteoporosis without current pathological fracture: Secondary | ICD-10-CM | POA: Diagnosis not present

## 2022-04-02 LAB — BASIC METABOLIC PANEL
BUN: 10 mg/dL (ref 6–23)
CO2: 27 mEq/L (ref 19–32)
Calcium: 9 mg/dL (ref 8.4–10.5)
Chloride: 103 mEq/L (ref 96–112)
Creatinine, Ser: 0.59 mg/dL (ref 0.40–1.20)
GFR: 89.55 mL/min (ref >60.00)
Glucose, Bld: 90 mg/dL (ref 70–99)
Potassium: 4.2 mEq/L (ref 3.5–5.1)
Sodium: 140 mEq/L (ref 135–145)

## 2022-04-02 LAB — ALBUMIN: Albumin: 4.6 g/dL (ref 3.5–5.2)

## 2022-04-02 LAB — VITAMIN D 25 HYDROXY (VIT D DEFICIENCY, FRACTURES): VITD: 42.57 ng/mL (ref 30.00–100.00)

## 2022-04-02 LAB — MAGNESIUM: Magnesium: 2.2 mg/dL (ref 1.5–2.5)

## 2022-04-02 LAB — PHOSPHORUS: Phosphorus: 3.5 mg/dL (ref 2.3–4.6)

## 2022-04-02 NOTE — Progress Notes (Unsigned)
Name: Denise Parker  MRN/ DOB: [id number], April 06, 1950    Age/ Sex: 72 y.o., female    PCP: Pleas Koch, NP   Reason for Endocrinology Evaluation: Osteoporosis     Date of Initial Endocrinology Evaluation: 04/02/2022     HPI: Denise Parker is a 72 y.o. female with a past medical history of dyslipidemia, osteoporosis, restless leg syndrome. The patient presented for initial endocrinology clinic visit on 04/02/2022 for consultative assistance with her osteoporosis.   Pt was diagnosed with osteoporosis:2018 with a T-score of -3.4 at the left distal radius    Menarche at age : 28 Menopausal at age : early 57's  Fracture Hx: right wrist - 1990 ( slipped on ice) and in 2012- right tibial (stepped off pavement )   Hx of HRT: yes, short period of time ( few months)  FH of osteoporosis or hip fracture: no Prior Hx of anti-resorptive therapy : was started on Fosamax  in 2018 but discontinued due to   esophageal/chest pain  by 03/2017 Started  Prolia 10/2017  Caltrate Calcium 600 mg BID  Radiation exposure - none   Denies recent falls  Has back pains and right hip   She avoids dairy due to dyslipidemia, eats mostly  plant based food Last Prolia 12/2021 No glucocorticoids except through eye drops     HISTORY:  Past Medical History:  Past Medical History:  Diagnosis Date   Arthritis    Bronchitis 01/30/2021   Bursitis    back   Cough in adult 01/30/2021   Herpes zoster 04/13/2013   HLD (hyperlipidemia)    HOH (hard of hearing)    Past Surgical History:  Past Surgical History:  Procedure Laterality Date   BREAST CYST ASPIRATION Left 90s   benign   CATARACT EXTRACTION W/PHACO Left 03/26/2017   Procedure: CATARACT EXTRACTION PHACO AND INTRAOCULAR LENS PLACEMENT ([place]);  Surgeon: Eulogio Bear, MD;  Location: [facility] ORS;  Service: Ophthalmology;  Laterality: Left;   fluid pack lot #   [id number] H [place]    00:23.1 AP%  7.5 CDE   1.71    CATARACT EXTRACTION W/PHACO  Right 07/30/2017   Procedure: CATARACT EXTRACTION PHACO AND INTRAOCULAR LENS PLACEMENT ([facility]);  Surgeon: Eulogio Bear, MD;  Location: [facility] OR[facility];  Service: Ophthalmology;  Laterality: Right;  fluid pack lot #  [id number]  Exp  03/26/2018 [place]   00:32.1 AP%   5.9 CDE   1.81   COLONOSCOPY WITH PROPOFOL N/A 09/11/2017   Procedure: COLONOSCOPY WITH PROPOFOL;  Surgeon: Virgel Manifold, MD;  Location: [facility] END[facility];  Service: Gastroenterology;  Laterality: N/A;   TONSILLECTOMY     TUBAL LIGATION      Social History:  reports that she has never smoked. She has never used smokeless tobacco. She reports current alcohol use of about 21.0 standard drinks of alcohol per week. She reports that she does not use drugs. Family History: family history includes Breast cancer in her sister; Breast cancer (age of onset: 69) in her sister; Colon cancer (age of onset: 36) in her sister; Heart attack (age of onset: 81) in her brother; Heart disease in her father and mother; Leukemia in her sister; Lupus in her father, sister, and sister; Stroke in her mother.   HOME MEDICATIONS: Allergies as of 04/02/2022       Reactions   Probiotic Product Itching, Swelling   Can't remember the name, it was a women's probiotic supplement. It cause burning, itching and swelling  of the ears        Medication List        Accurate as of April 02, 2022 11:41 AM. If you have any questions, ask your nurse or doctor.          atorvastatin 20 MG tablet Commonly known as: LIPITOR Take 1 tablet (20 mg total) by mouth daily.   BIOFREEZE EX Apply 1 application topically daily as needed (bursitis in the hip and back pain).   Caltrate 600+D Plus Minerals 600-800 MG-UNIT Tabs Take 2 tablets by mouth.   cetirizine 10 MG tablet Commonly known as: ZYRTEC Take 1 tablet (10 mg total) by mouth daily as needed for allergies.   CRANBERRY-VITAMIN C PO Take 1 tablet by mouth daily.   ELDERBERRY PO Take 2 each by mouth daily at  12 noon.   fluorometholone 0.1 % ophthalmic suspension Commonly known as: FML Place 1 drop into both eyes daily.   fluticasone 50 MCG/ACT nasal spray Commonly known as: FLONASE SHAKE LIQUID AND USE 1 SPRAY IN EACH NOSTRIL DAILY   GLUCOSAMINE CHONDROITIN COMPLX PO Take 1 tablet by mouth daily.   MELATONIN PO Take by mouth.   Prolia 60 MG/ML Sosy injection Generic drug: denosumab INJECT 60MG  SUBCUTANEOUSLY EVERY 6 MONTHS   Turmeric 500 MG Tabs Take 500 mg by mouth 3 (three) times daily.   vitamin E 180 MG (400 UNITS) capsule Take 400 Units by mouth daily.          REVIEW OF SYSTEMS: A comprehensive ROS was conducted with the patient and is negative except as per HPI    OBJECTIVE:  VS: BP 116/70 (BP Location: Left Arm, Patient Position: Sitting, Cuff Size: Large)   Pulse (!) 58   Ht 5' 3.25" (1.607 m)   Wt 201 lb (91.2 kg)   SpO2 99%   BMI 35.32 kg/m    Wt Readings from Last 3 Encounters:  04/02/22 201 lb (91.2 kg)  11/21/21 193 lb (87.5 kg)  06/10/21 198 lb 6 oz (90 kg)     EXAM: General: Pt appears well and is in NAD  Eyes: External eye exam normal without stare, lid lag or exophthalmos.  EOM intact.  PERRL.  Neck: General: Supple without adenopathy. Thyroid: Thyroid size normal.  No goiter or nodules appreciated. No thyroid bruit.  Lungs: Clear with good BS bilat with no rales, rhonchi, or wheezes  Heart: Auscultation: RRR.  Abdomen: Normoactive bowel sounds, soft, nontender, without masses or organomegaly palpable  Extremities:  BL LE: No pretibial edema normal ROM and strength.  Mental Status: Judgment, insight: Intact Orientation: Oriented to time, place, and person Mood and affect: No depression, anxiety, or agitation     DATA REVIEWED:  Latest Reference Range & Units 04/02/22 11:49  Sodium 135 - 145 mEq/L 140  Potassium 3.5 - 5.1 mEq/L 4.2  Chloride 96 - 112 mEq/L 103  CO2 19 - 32 mEq/L 27  Glucose 70 - 99 mg/dL 90  BUN 6 - 23 mg/dL 10   Creatinine 0.40 - 1.20 mg/dL 0.59  Calcium 8.4 - 10.5 mg/dL 9.0  Phosphorus 2.3 - 4.6 mg/dL 3.5  Magnesium 1.5 - 2.5 mg/dL 2.2  Albumin 3.5 - 5.2 g/dL 4.6  GFR >60.00 mL/min 89.55    Latest Reference Range & Units 04/02/22 11:49  VITD 30.00 - 100.00 ng/mL 42.57      ASSESSMENT/PLAN/RECOMMENDATIONS:   Osteoporosis :  -Patient with severe osteoporosis and a  history of two fragility fractures in the past -  She did not tolerate alendronate, due to chest pains when she eats or drinks (~esophageal spasms?) -She was unclear of the exact start date of Prolia, but looking at her records it appears that this was started 10/2017, which means she has been on it for approximately 5 years -I have recommended that she takes her next dose of Prolia scheduled in May 2024 -She will have her bone density done by August, 2024 and we will discuss alternative therapy based on her bone density results, when I see her in September. -Emphasized the importance of optimizing calcium and vitamin D intake -We will proceed with 24-hour urine collection for cortisol and calcium, to exclude secondary causes of osteoporosis -Labs today show normal BMP including calcium, normal vitamin D, magnesium and Phosphorus   F/U 10/2022  Signed electronically by: Mack Guise, MD  [facility] Endocrinology  [address] [place], [place] [address] Phone: [phone] FAX: [phone]   CC: Pleas Koch, NP [place] [address] Phone: [phone] Fax: [phone]   Return to Endocrinology clinic as below: No future appointments.

## 2022-04-02 NOTE — Patient Instructions (Addendum)
Calcium 600 mg twice daily   Take Prolia through your primary care office in May, will repeat Bone density in August and discuss alternatives on next visit    24-Hour Urine Collection  You will be collecting your urine for a 24-hour period of time. Your timer starts with your first urine of the morning (For example - If you first pee at [place], your timer will start at [place]) [place] away your first urine of the morning Collect your urine every time you pee for the next 24 hours STOP your urine collection 24 hours after you started the collection (For example - You would stop at 9AM the day after you started)

## 2022-04-03 LAB — PARATHYROID HORMONE, INTACT (NO CA): PTH: 28 pg/mL (ref 16–77)

## 2022-04-04 ENCOUNTER — Other Ambulatory Visit: Payer: Medicare Other

## 2022-04-04 DIAGNOSIS — M810 Age-related osteoporosis without current pathological fracture: Secondary | ICD-10-CM | POA: Diagnosis not present

## 2022-04-09 LAB — CORTISOL, URINE, 24 HOUR
24 Hour urine volume (VMAHVA): 3750 mL
CREATININE, URINE: 0.93 g/(24.h) (ref 0.50–2.15)
Cortisol (Ur), Free: 49.8 mcg/24 h (ref 4.0–50.0)

## 2022-04-09 LAB — CALCIUM, URINE, 24 HOUR: Calcium, 24H Urine: 173 mg/24 h

## 2022-04-15 ENCOUNTER — Other Ambulatory Visit: Payer: Self-pay

## 2022-05-08 ENCOUNTER — Other Ambulatory Visit: Payer: Self-pay | Admitting: Primary Care

## 2022-05-08 DIAGNOSIS — Z1231 Encounter for screening mammogram for malignant neoplasm of breast: Secondary | ICD-10-CM

## 2022-05-16 ENCOUNTER — Telehealth: Payer: Self-pay

## 2022-05-16 DIAGNOSIS — E785 Hyperlipidemia, unspecified: Secondary | ICD-10-CM

## 2022-05-16 MED ORDER — ATORVASTATIN CALCIUM 20 MG PO TABS: 20.0000 mg | ORAL_TABLET | Freq: Every day | ORAL | 1 refills | Status: DC

## 2022-05-16 NOTE — Addendum Note (Signed)
Addended by: Pleas Koch on: 05/16/2022 04:11 PM   Modules accepted: Orders

## 2022-05-16 NOTE — Telephone Encounter (Signed)
Refills sent to pharmacy. 

## 2022-05-16 NOTE — Telephone Encounter (Signed)
Received refill request for atorvastatin 20 mg tab

## 2022-05-26 ENCOUNTER — Telehealth: Payer: Self-pay

## 2022-05-26 DIAGNOSIS — J309 Allergic rhinitis, unspecified: Secondary | ICD-10-CM

## 2022-05-26 MED ORDER — FLUTICASONE PROPIONATE 50 MCG/ACT NA SUSP: 1.0000 | Freq: Two times a day (BID) | NASAL | 0 refills | Status: DC | PRN

## 2022-05-26 NOTE — Telephone Encounter (Signed)
Refills sent to pharmacy. 

## 2022-05-26 NOTE — Addendum Note (Signed)
Addended by: Pleas Koch on: 05/26/2022 05:50 PM   Modules accepted: Orders

## 2022-05-26 NOTE — Telephone Encounter (Signed)
Received refill request for  

## 2022-06-03 ENCOUNTER — Other Ambulatory Visit: Payer: Self-pay | Admitting: Primary Care

## 2022-06-03 ENCOUNTER — Telehealth: Payer: Self-pay | Admitting: Primary Care

## 2022-06-03 DIAGNOSIS — M810 Age-related osteoporosis without current pathological fracture: Secondary | ICD-10-CM

## 2022-06-03 NOTE — Telephone Encounter (Signed)
Patient called our office today regarding a message from [facility], stated her prolia was not approved. Patient was told by [facility] to call provider's office and find out what she needs for her insurance to approve prolia, she believes she may need to get labs done prior to. Would like a call back whenever possible, please advise [phone].

## 2022-06-03 NOTE — Telephone Encounter (Signed)
Received refill request for Prolia from pharmacy.   Does this get sent to our office or does she pick this up and have her Prolia injection completed in our office?  Looks like she saw Endo in February 2024 regarding osteoporosis.

## 2022-06-04 NOTE — Telephone Encounter (Signed)
Called and spoke to patient let her know that once benefits have been processed and we have that information we will call to set her up. Prolia not due until May 19 or after.

## 2022-06-04 NOTE — Telephone Encounter (Signed)
Have we received auth for prolia due in may

## 2022-06-05 NOTE — Telephone Encounter (Signed)
Following up on auth has it been received yet?

## 2022-06-05 NOTE — Telephone Encounter (Signed)
Reached out to patient. Made aware that we are working on getting all benefits submitted when it is approved we will send in for refill and call patient to set up appointments.

## 2022-06-06 ENCOUNTER — Other Ambulatory Visit (HOSPITAL_COMMUNITY): Payer: Self-pay

## 2022-06-06 NOTE — Telephone Encounter (Signed)
Prolia VOB initiated via [email or web address] 

## 2022-06-10 NOTE — Telephone Encounter (Signed)
Have we received approval for this patient?

## 2022-06-16 ENCOUNTER — Telehealth: Payer: Self-pay

## 2022-06-16 NOTE — Telephone Encounter (Signed)
New encounter for Prolia BIV routed to [facility]

## 2022-06-16 NOTE — Telephone Encounter (Signed)
Pt ready for scheduling for Prolia  on or after : 07/13/22  Out-of-pocket cost due at time of visit: $0  Primary: Medicare Prolia co-insurance: 0% Admin fee co-insurance: 0%  Secondary: Tricare for Life Prolia co-insurance: [place] Medicare Part B coinsurance and deductible Admin fee co-insurance:   Tertiary: A[employer] Med[employer] Prolia co-insurance: Admin fee co-insurance:  Medical Benefit Details: Date Benefits were checked: 06/10/22 Deductible: $0 met of $240 required/ Coinsurance: 0%/ Admin Fee: 0%  Prior Auth: N/A PA# Expiration Date:    Pharmacy benefit: Copay $--- If patient wants fill through the pharmacy benefit please send prescription to:  --- , and include estimated need by date in rx notes. Pharmacy will ship medication directly to the office.  Patient NOT eligible for Prolia Copay Card. Copay Card can make patient's cost as little as $25. Link to apply: [email or web address]://www.amgensupportplus.[email or web address]/copay  ** This summary of benefits is an estimation of the patient's out-of-pocket cost. Exact cost may very based on individual plan coverage.

## 2022-06-16 NOTE — Telephone Encounter (Signed)
Please start new PA for Prolia.  Patient's last inj was on 01/13/2022.  Please respond to Barnie Mort at [facility].  I am just filling in for the week.  Thank you!

## 2022-06-24 ENCOUNTER — Encounter: Payer: Self-pay | Admitting: Primary Care

## 2022-06-24 ENCOUNTER — Ambulatory Visit (INDEPENDENT_AMBULATORY_CARE_PROVIDER_SITE_OTHER): Payer: Medicare Other | Admitting: Primary Care

## 2022-06-24 VITALS — BP 112/74 | HR 65 | Temp 97.7°F | Ht 63.25 in | Wt 200.0 lb

## 2022-06-24 DIAGNOSIS — M545 Low back pain, unspecified: Secondary | ICD-10-CM | POA: Diagnosis not present

## 2022-06-24 DIAGNOSIS — E785 Hyperlipidemia, unspecified: Secondary | ICD-10-CM

## 2022-06-24 DIAGNOSIS — M810 Age-related osteoporosis without current pathological fracture: Secondary | ICD-10-CM | POA: Diagnosis not present

## 2022-06-24 DIAGNOSIS — F1090 Alcohol use, unspecified, uncomplicated: Secondary | ICD-10-CM | POA: Diagnosis not present

## 2022-06-24 DIAGNOSIS — K219 Gastro-esophageal reflux disease without esophagitis: Secondary | ICD-10-CM

## 2022-06-24 DIAGNOSIS — M791 Myalgia, unspecified site: Secondary | ICD-10-CM | POA: Diagnosis not present

## 2022-06-24 DIAGNOSIS — G8929 Other chronic pain: Secondary | ICD-10-CM | POA: Diagnosis not present

## 2022-06-24 NOTE — Assessment & Plan Note (Signed)
Following with endocrinology. Reviewed office notes from February 2024.   Continue Prolia 60 mg/ml semi-annually.  Due in May 2024.

## 2022-06-24 NOTE — Assessment & Plan Note (Signed)
Controlled with dietary changes. Continue to monitor.   Avoid NSAIDs.

## 2022-06-24 NOTE — Assessment & Plan Note (Addendum)
Commended her on a healthy diet and regular exercise.  Continue atorvastatin 20 mg daily. Reviewed lipid panel from October 2023.

## 2022-06-24 NOTE — Assessment & Plan Note (Signed)
Stable.       - Continue to monitor

## 2022-06-24 NOTE — Patient Instructions (Addendum)
We will contact you regarding your Prolia injection.   You will either be contacted via phone regarding your referral to physical therapy, or you may receive a letter on your MyChart portal from our referral team with instructions for scheduling an appointment. Please let us know if you have not been contacted by anyone within two weeks.  It was a pleasure to see you today!

## 2022-06-24 NOTE — Assessment & Plan Note (Signed)
Suspect pelvic symptoms are MSK related, especially as she can provoke with movement.  No alarm signs on exam.  Refer for physical therapy.

## 2022-06-24 NOTE — Progress Notes (Signed)
Subjective:    Patient ID: Denise Parker, female    DOB: [birth year], 72 y.o.   MRN: [id number]  HPI  Denise Parker is a very pleasant 72 y.o. female with a history of GERD, chronic gastritis, osteoporosis, hyperlipidemia, heavy alcohol use, restless legs who presents today for follow-up of chronic conditions and to discuss a few new concerns.   Immunizations: -Influenza: Completed last season -Shingles: Completed Shingrix series -Pneumonia: Completed Prevnar 13 in 2015 and Pneumovax 23 in 2020  Mammogram: Completed in September 2023 and is scheduled for September 2024. Bone Density Scan: Scheduled for 2024  Colonoscopy: Completed in 2019, due 2029   1) Hyperlipidemia: Currently managed on atorvastatin 20 mg daily.  Last lipid panel is from October 2023 with LDL of 107, HDL of 63.  2) Osteoporosis: Currently managed on Prolia 60 mg/mL injections semiannually.  Her last bone density scan was in August 2022 and she is scheduled for September 2024.  She follows with endocrine neurology who completed lab work in February 2024.  The plan is to continue her Prolia injection as scheduled for May 2024 and reevaluate after bone density scan later this year.  3) Pelvic Pain: Symptom onset about 3 weeks ago with soreness to the mid to lower pelvic region with certain movements (getting into bed, lifting her legs). History of tubal ligation which is where her pain is felt.   She is very active with exercise, does high intensity courses and yoga. Just prior to symptom onset she did complete a high intensity class (lunges, squats).   She denies vaginal bleeding, pelvic mass, urinary symptoms.   4) Acute Back Pain: Acute on chronic back pain flare that began about 2 weeks ago. Her pain begins to the right lower back with radiation down her buttocks, right groin, down to right calf.   Her symptom onset began while she was visiting friends in [place]. During this trip she was sleeping  in different beds, long car rides to and from [place]. She's been taking Tumeric and using BioFreeze.   Symptoms are worse with physical activity in the yard. She has done chair yoga and walking with improvement. She denies numbness, loss of bowel/bladder control.   BP Readings from Last 3 Encounters:  06/24/22 112/74  04/02/22 116/70  11/21/21 (!) 140/68     Review of Systems  Respiratory:  Negative for shortness of breath.   Cardiovascular:  Negative for chest pain.  Gastrointestinal:  Negative for constipation and diarrhea.  Genitourinary:  Negative for dysuria, frequency and vaginal bleeding.  Musculoskeletal:  Positive for arthralgias and back pain.  Neurological:  Negative for numbness.         Past Medical History:  Diagnosis Date   Arthritis    Bronchitis 01/30/2021   Bursitis    back   Chronic gastritis without bleeding 05/30/2019   Cough in adult 01/30/2021   Herpes zoster 04/13/2013   HLD (hyperlipidemia)    HOH (hard of hearing)    Urinary frequency 11/21/2021    Social History   Socioeconomic History   Marital status: Married    Spouse name: Denise Parker   Number of children: 1   Years of education: IT sales professional   Highest education level: Not on file  Occupational History   Not on file  Tobacco Use   Smoking status: Never   Smokeless tobacco: Never  Vaping Use   Vaping Use: Never used  Substance and Sexual Activity   Alcohol use: Yes  Alcohol/week: 21.0 standard drinks of alcohol    Types: 21 Glasses of wine per week    Comment: Wine-regular   Drug use: No   Sexual activity: Yes    Birth control/protection: Surgical, Post-menopausal  Other Topics Concern   Not on file  Social History Narrative      Does not have a living will or HPOA   Desires CPR, would not want prolonged life support if futile.      05/30/19   From: originally from this area, but spent time in [place]   Living: with husband Denise Parker, and son and his family live upstairs    Work: retired from [employer], also use to teach [employer]      Family: Son - Denise Parker, has 5 grandchildren (2 grown in [place]) and 3 live here      Enjoys: reading, listen to books on tape, walk, hike, volunteering at the senior center      Exercise: walking - 5-7 times a week, 4 miles per day   Diet: pretty good, limits pork and beef, fish once a week      Safety   Seat belts: Yes    Guns: Yes  and secure   Safe in relationships: Yes       Social Determinants of Corporate investment banker Strain: Not on file  Food Insecurity: Not on file  Transportation Needs: Not on file  Physical Activity: Not on file  Stress: Not on file  Social Connections: Not on file  Intimate Partner Violence: Not on file    Past Surgical History:  Procedure Laterality Date   BREAST CYST ASPIRATION Left 90s   benign   CATARACT EXTRACTION W/PHACO Left 03/26/2017   Procedure: CATARACT EXTRACTION PHACO AND INTRAOCULAR LENS PLACEMENT ([facility]);  Surgeon: [place] Crane, MD;  Location: [facility] ORS;  Service: Ophthalmology;  Laterality: Left;   fluid pack lot #   [id number] H [place]    00:23.1 AP%  7.5 CDE   1.71    CATARACT EXTRACTION W/PHACO Right 07/30/2017   Procedure: CATARACT EXTRACTION PHACO AND INTRAOCULAR LENS PLACEMENT ([facility]);  Surgeon: [place] Crane, MD;  Location: [facility] OR[facility];  Service: Ophthalmology;  Laterality: Right;  fluid pack lot #  [id number]  Exp  03/26/2018 [place]   00:32.1 AP%   5.9 CDE   1.81   COLONOSCOPY WITH PROPOFOL N/A 09/11/2017   Procedure: COLONOSCOPY WITH PROPOFOL;  Surgeon: Pasty Spillers, MD;  Location: [facility] ENDOSCOPY;  Service: Gastroenterology;  Laterality: N/A;   TONSILLECTOMY     TUBAL LIGATION      Family History  Problem Relation Age of Onset   Heart disease Mother    Stroke Mother    Heart disease Father    Lupus Father    Breast cancer Sister 79   Leukemia Sister    Colon cancer Sister 8   Lupus Sister    Breast cancer Sister    Lupus Sister     Heart attack Brother 86    Allergies  Allergen Reactions   Probiotic Product Itching and Swelling    Can't remember the name, it was a women's probiotic supplement. It cause burning, itching and swelling of the ears    Current Outpatient Medications on File Prior to Visit  Medication Sig Dispense Refill   atorvastatin (LIPITOR) 20 MG tablet Take 1 tablet (20 mg total) by mouth daily. for cholesterol. 90 tablet 1   Calcium Carbonate-Vit D-Min (CALTRATE 600+D PLUS MINERALS) 600-800 MG-UNIT TABS  Take 2 tablets by mouth.     cetirizine (ZYRTEC) 10 MG tablet Take 1 tablet (10 mg total) by mouth daily as needed for allergies. 90 tablet 1   COLLAGEN PO Take by mouth.     CRANBERRY-VITAMIN C PO Take 1 tablet by mouth daily.      denosumab (PROLIA) 60 MG/ML SOSY injection INJECT 60MG  SUBCUTANEOUSLY EVERY 6 MONTHS 1 mL 0   fluorometholone (FML) 0.1 % ophthalmic suspension Place 1 drop into both eyes daily.     fluticasone (FLONASE) 50 MCG/ACT nasal spray Place 1 spray into both nostrils 2 (two) times daily as needed for allergies or rhinitis. 48 g 0   GLUCOSAMINE CHONDROITIN COMPLX PO Take 1 tablet by mouth daily.      MELATONIN PO Take by mouth.     Menthol, Topical Analgesic, (BIOFREEZE EX) Apply 1 application topically daily as needed (bursitis in the hip and back pain).     Turmeric 500 MG TABS Take 500 mg by mouth 3 (three) times daily.     vitamin E 400 UNIT capsule Take 400 Units by mouth daily.     ELDERBERRY PO Take 2 each by mouth daily at 12 noon. (Patient not taking: Reported on 06/24/2022)     No current facility-administered medications on file prior to visit.    BP 112/74   Pulse 65   Temp 97.7 F (36.5 C) (Temporal)   Ht 5' 3.25" (1.607 m)   Wt 200 lb (90.7 kg)   SpO2 100%   BMI 35.15 kg/m  Objective:   Physical Exam Cardiovascular:     Rate and Rhythm: Normal rate and regular rhythm.  Pulmonary:     Effort: Pulmonary effort is normal.     Breath sounds: Normal breath  sounds.  Musculoskeletal:     Cervical back: Neck supple.  Skin:    General: Skin is warm and dry.  Neurological:     Mental Status: She is alert and oriented to person, place, and time.  Psychiatric:        Mood and Affect: Mood normal.           Assessment & Plan:  Osteoporosis, unspecified osteoporosis type, unspecified pathological fracture presence Assessment & Plan: Following with endocrinology. Reviewed office notes from February 2024.   Continue Prolia 60 mg/ml semi-annually.  Due in May 2024.   Chronic low back pain, unspecified back pain laterality, unspecified whether sciatica present Assessment & Plan: Acute on chronic flare.   Given little improvement with typical conservative care, will refer to physical therapy.  She agrees.   Orders: -     Ambulatory referral to Physical Therapy  Heavy alcohol use Assessment & Plan: Stable.   Continue to monitor.    Hyperlipidemia, unspecified hyperlipidemia type Assessment & Plan: Commended her on a healthy diet and regular exercise.  Continue atorvastatin 20 mg daily. Reviewed lipid panel from October 2023.  Orders: -     Lipid panel; Future -     Comprehensive metabolic panel; Future  Myalgia Assessment & Plan: Suspect pelvic symptoms are MSK related, especially as she can provoke with movement.  No alarm signs on exam.  Refer for physical therapy.   Orders: -     Ambulatory referral to Physical Therapy  Gastroesophageal reflux disease, unspecified whether esophagitis present Assessment & Plan: Controlled with dietary changes. Continue to monitor.   Avoid NSAIDs.         Doreene Nest, NP

## 2022-06-24 NOTE — Assessment & Plan Note (Signed)
Acute on chronic flare.   Given little improvement with typical conservative care, will refer to physical therapy.  She agrees.

## 2022-06-27 ENCOUNTER — Other Ambulatory Visit (HOSPITAL_COMMUNITY): Payer: Self-pay

## 2022-06-27 NOTE — Telephone Encounter (Signed)
Prior authorization is not needed at this time. Successful test claim with co-pay of $0.00

## 2022-07-07 ENCOUNTER — Other Ambulatory Visit: Payer: Self-pay

## 2022-07-07 ENCOUNTER — Other Ambulatory Visit (INDEPENDENT_AMBULATORY_CARE_PROVIDER_SITE_OTHER): Payer: Medicare Other

## 2022-07-07 DIAGNOSIS — E785 Hyperlipidemia, unspecified: Secondary | ICD-10-CM

## 2022-07-07 LAB — LIPID PANEL
Cholesterol: 196 mg/dL (ref 0–200)
HDL: 65 mg/dL (ref >39.00)
LDL Cholesterol: 115 mg/dL — ABNORMAL HIGH (ref 0–99)
NonHDL: 131.43
Total CHOL/HDL Ratio: 3
Triglycerides: 84 mg/dL (ref 0.0–149.0)
VLDL: 16.8 mg/dL (ref 0.0–40.0)

## 2022-07-07 LAB — COMPREHENSIVE METABOLIC PANEL
ALT: 16 U/L (ref 0–35)
AST: 18 U/L (ref 0–37)
Albumin: 4 g/dL (ref 3.5–5.2)
Alkaline Phosphatase: 40 U/L (ref 39–117)
BUN: 8 mg/dL (ref 6–23)
CO2: 30 mEq/L (ref 19–32)
Calcium: 8.9 mg/dL (ref 8.4–10.5)
Chloride: 104 mEq/L (ref 96–112)
Creatinine, Ser: 0.65 mg/dL (ref 0.40–1.20)
GFR: 87.32 mL/min (ref >60.00)
Glucose, Bld: 84 mg/dL (ref 70–99)
Potassium: 4 mEq/L (ref 3.5–5.1)
Sodium: 141 mEq/L (ref 135–145)
Total Bilirubin: 0.5 mg/dL (ref 0.2–1.2)
Total Protein: 6.5 g/dL (ref 6.0–8.3)

## 2022-07-07 MED ORDER — DENOSUMAB 60 MG/ML ~~LOC~~ SOSY: 60.0000 mg | PREFILLED_SYRINGE | Freq: Once | SUBCUTANEOUS | 0 refills | Status: AC

## 2022-07-07 NOTE — Telephone Encounter (Deleted)
Follow up on labs and if delivered scheduled 07/15/22

## 2022-07-07 NOTE — Telephone Encounter (Addendum)
Called patient reviewed all following information including appointment, Co pay due at time of visit and if pick up of injection is needed from outside pharmacy.     Out of pocket for patient: $0   Lab appointment : 07/07/22  Nurse visit:  07/15/22  Lab order placed: No lab order in   Prolia has been  []   Ordered  []   Script sent to local pharmacy for patient to bring   [x]   [facility]   []   Script sent to [facility] to deliver

## 2022-07-15 ENCOUNTER — Other Ambulatory Visit: Payer: Self-pay

## 2022-07-15 ENCOUNTER — Telehealth: Payer: Self-pay

## 2022-07-15 ENCOUNTER — Ambulatory Visit: Payer: Medicare Other | Admitting: *Deleted

## 2022-07-15 MED ORDER — DENOSUMAB 60 MG/ML ~~LOC~~ SOSY: 60.0000 mg | PREFILLED_SYRINGE | Freq: Once | SUBCUTANEOUS | 0 refills | Status: AC

## 2022-07-15 NOTE — Telephone Encounter (Signed)
Need to call patient once received from mail order to set up of nurse visit.

## 2022-07-15 NOTE — Telephone Encounter (Signed)
Patient came to the office for a Prolia shot. Patient stated that she got a call last night from [facility]. Patient stated that she called them back this morning and was advised that the Prolia medication has to come from their pharmacy or it would cost her $300. Patient was advised that Joellen will order her Prolia from [facility] and will call her when it comes in to reschedule for nurse visit.

## 2022-07-17 ENCOUNTER — Ambulatory Visit (INDEPENDENT_AMBULATORY_CARE_PROVIDER_SITE_OTHER): Payer: Medicare Other

## 2022-07-17 DIAGNOSIS — E785 Hyperlipidemia, unspecified: Secondary | ICD-10-CM

## 2022-07-17 DIAGNOSIS — M810 Age-related osteoporosis without current pathological fracture: Secondary | ICD-10-CM | POA: Diagnosis not present

## 2022-07-17 MED ORDER — DENOSUMAB 60 MG/ML ~~LOC~~ SOSY
60.0000 mg | PREFILLED_SYRINGE | Freq: Once | SUBCUTANEOUS | Status: AC
Start: 2022-07-17 — End: 2022-07-17
  Administered 2022-07-17: 60 mg via SUBCUTANEOUS

## 2022-07-17 MED ORDER — ATORVASTATIN CALCIUM 40 MG PO TABS
40.0000 mg | ORAL_TABLET | Freq: Every day | ORAL | 1 refills | Status: DC
Start: 2022-07-17 — End: 2022-12-17

## 2022-07-17 NOTE — Progress Notes (Signed)
Per orders of Mayra Reel, DNP, injection of Prolia given by Nanci Pina. Patient tolerated injection well.

## 2022-07-24 ENCOUNTER — Ambulatory Visit (INDEPENDENT_AMBULATORY_CARE_PROVIDER_SITE_OTHER): Payer: Medicare Other | Admitting: Family

## 2022-07-24 ENCOUNTER — Encounter: Payer: Self-pay | Admitting: Family

## 2022-07-24 ENCOUNTER — Ambulatory Visit (INDEPENDENT_AMBULATORY_CARE_PROVIDER_SITE_OTHER)
Admission: RE | Admit: 2022-07-24 | Discharge: 2022-07-24 | Disposition: A | Discharge status: Home / Self Care | Payer: Medicare Other | Source: Ambulatory Visit | Attending: Family | Admitting: Family

## 2022-07-24 VITALS — BP 138/74 | HR 55 | Temp 97.8°F | Ht 63.25 in | Wt 203.2 lb

## 2022-07-24 DIAGNOSIS — M25561 Pain in right knee: Secondary | ICD-10-CM

## 2022-07-24 HISTORY — DX: Pain in right knee: M25.561

## 2022-07-24 NOTE — Assessment & Plan Note (Signed)
Xray today in office to r/o acute injury  Ice/heat to site prn  Elevate knee when able Rest / compression for now.  Voltaren gel tylenol prn

## 2022-07-24 NOTE — Progress Notes (Signed)
Established Patient Office Visit  Subjective:   Patient ID: Denise Parker, female    DOB: 05/19/1950  Age: 72 y.o. MRN: [id number]  CC:  Chief Complaint  Patient presents with   Right Knee Pain    HPI: Denise Parker is a 72 y.o. female presenting on 07/24/2022 for Right Knee Pain   HPI  C/o right knee swelling especially on her left inner aspect of patella.  One week ago she was doing her normal two mile walk in the am, and then went to her zumba class and then a tai chi class (but this is all normal for her regular routine)    She did state during her zumba class it started with pain, and by the time she had her tai chi class it hurt even more and later that night she had noticed the swelling and pain. She has been applying ice as well with mild relief and decrease slightly in swelling.   Symptoms worse with lying down and or sitting still (not moving) Hurts when she is extending the leg as well.       ROS: Negative unless specifically indicated above in HPI.   Relevant past medical history reviewed and updated as indicated.   Allergies and medications reviewed and updated.   Current Outpatient Medications:    atorvastatin (LIPITOR) 40 MG tablet, Take 1 tablet (40 mg total) by mouth daily. for cholesterol., Disp: 90 tablet, Rfl: 1   Calcium Carbonate-Vit D-Min (CALTRATE 600+D PLUS MINERALS) 600-800 MG-UNIT TABS, Take 2 tablets by mouth., Disp: , Rfl:    cetirizine (ZYRTEC) 10 MG tablet, Take 1 tablet (10 mg total) by mouth daily as needed for allergies., Disp: 90 tablet, Rfl: 1   COLLAGEN PO, Take by mouth., Disp: , Rfl:    CRANBERRY-VITAMIN C PO, Take 1 tablet by mouth daily. , Disp: , Rfl:    denosumab (PROLIA) 60 MG/ML SOSY injection, INJECT 60MG  SUBCUTANEOUSLY EVERY 6 MONTHS, Disp: 1 mL, Rfl: 0   fluorometholone (FML) 0.1 % ophthalmic suspension, Place 1 drop into both eyes daily., Disp: , Rfl:    fluticasone (FLONASE) 50 MCG/ACT nasal spray, Place 1  spray into both nostrils 2 (two) times daily as needed for allergies or rhinitis., Disp: 48 g, Rfl: 0   GLUCOSAMINE CHONDROITIN COMPLX PO, Take 1 tablet by mouth daily. , Disp: , Rfl:    MELATONIN PO, Take by mouth., Disp: , Rfl:    Menthol, Topical Analgesic, (BIOFREEZE EX), Apply 1 application topically daily as needed (bursitis in the hip and back pain)., Disp: , Rfl:    Turmeric 500 MG TABS, Take 500 mg by mouth 3 (three) times daily., Disp: , Rfl:    vitamin E 400 UNIT capsule, Take 400 Units by mouth daily., Disp: , Rfl:   Allergies  Allergen Reactions   Probiotic Product Itching and Swelling    Can't remember the name, it was a women's probiotic supplement. It cause burning, itching and swelling of the ears    Objective:   BP 138/74 (BP Location: Right Arm, Patient Position: Sitting, Cuff Size: Large)   Pulse (!) 55   Temp 97.8 F (36.6 C) (Temporal)   Ht 5' 3.25" (1.607 m)   Wt 203 lb 4 oz (92.2 kg)   SpO2 98%   BMI 35.72 kg/m    Physical Exam Musculoskeletal:     Right knee: Swelling (lower left medial aspect of patella , also with point tenderness) present. Decreased range of motion (pain  with extension and with internal rotation).     Assessment & Plan:  Acute pain of right knee Assessment & Plan: Xray today in office to r/o acute injury  Ice/heat to site prn  Elevate knee when able Rest / compression for now.  Voltaren gel tylenol prn   Orders: -     DG Knee Complete 4 Views Right; Future     Follow up plan: Return for f/u PCP if no improvement in symptoms.  Mort Sawyers, FNP

## 2022-07-24 NOTE — Patient Instructions (Addendum)
  Recommend that you try some voltaren gel and also ice/apply heat.  Tylenol as needed.  Wear compression sleeve.    Regards,   Mort Sawyers FNP-C

## 2022-07-28 NOTE — Progress Notes (Signed)
One of Kates patient  She is seeing you 6/5 for right knee pain.  These were xray findings.

## 2022-07-28 NOTE — Progress Notes (Signed)
Knee xray does show inflammation as well as arthritic changes.  There is some tilting of the patella which means it is slightly off its normal location, however this can typically be treated with rest, ice, elevation, compression, and anti inflammatory medications as needed. Also working on strengthing the quad muscles is helpful. Follow up with Dr. Patsy Lager on 6/5 for further evaluation.

## 2022-07-29 ENCOUNTER — Other Ambulatory Visit: Payer: Self-pay

## 2022-07-29 ENCOUNTER — Ambulatory Visit: Payer: Medicare Other | Attending: Internal Medicine | Admitting: Physical Therapy

## 2022-07-29 DIAGNOSIS — G8929 Other chronic pain: Secondary | ICD-10-CM | POA: Diagnosis not present

## 2022-07-29 DIAGNOSIS — M5459 Other low back pain: Secondary | ICD-10-CM | POA: Diagnosis not present

## 2022-07-29 DIAGNOSIS — M791 Myalgia, unspecified site: Secondary | ICD-10-CM | POA: Insufficient documentation

## 2022-07-29 DIAGNOSIS — M545 Low back pain, unspecified: Secondary | ICD-10-CM | POA: Diagnosis not present

## 2022-07-29 DIAGNOSIS — M6281 Muscle weakness (generalized): Secondary | ICD-10-CM | POA: Diagnosis not present

## 2022-07-29 NOTE — Therapy (Addendum)
OUTPATIENT PHYSICAL THERAPY THORACOLUMBAR EVALUATION   Patient Name: Denise Parker MRN: [id number] DOB:12/26/1950, 72 y.o., female Today's Date: 07/29/2022  END OF SESSION:  PT End of Session - 07/29/22 1258     Visit Number 1    Number of Visits 20    Date for PT Re-Evaluation 10/07/22    Authorization Type Medicare & [employer]    Authorization - Visit Number 1    Authorization - Number of Visits 20    Progress Note Due on Visit 10    PT Start Time 1115    PT Stop Time 1200    PT Time Calculation (min) 45 min    Activity Tolerance Patient tolerated treatment well    Behavior During Therapy [facility] for tasks assessed/performed             Past Medical History:  Diagnosis Date   Arthritis    Bronchitis 01/30/2021   Bursitis    back   Chronic gastritis without bleeding 05/30/2019   Cough in adult 01/30/2021   Herpes zoster 04/13/2013   HLD (hyperlipidemia)    HOH (hard of hearing)    Urinary frequency 11/21/2021   Past Surgical History:  Procedure Laterality Date   BREAST CYST ASPIRATION Left 90s   benign   CATARACT EXTRACTION W/PHACO Left 03/26/2017   Procedure: CATARACT EXTRACTION PHACO AND INTRAOCULAR LENS PLACEMENT (IOC);  Surgeon: Nevada Crane, MD;  Location: [facility] ORS;  Service: Ophthalmology;  Laterality: Left;   fluid pack lot #   [id number] [place]    00:23.1 AP%  7.5 CDE   1.71    CATARACT EXTRACTION W/PHACO Right 07/30/2017   Procedure: CATARACT EXTRACTION PHACO AND INTRAOCULAR LENS PLACEMENT (IOC);  Surgeon: Nevada Crane, MD;  Location: [facility] ORS;  Service: Ophthalmology;  Laterality: Right;  fluid pack lot #  [id number]  Exp  03/26/2018 [place]   00:32.1 AP%   5.9 CDE   1.81   COLONOSCOPY WITH PROPOFOL N/A 09/11/2017   Procedure: COLONOSCOPY WITH PROPOFOL;  Surgeon: Pasty Spillers, MD;  Location: [facility] ENDOSCOPY;  Service: Gastroenterology;  Laterality: N/A;   TONSILLECTOMY     TUBAL LIGATION     Patient Active Problem List   Diagnosis Date Noted    Acute pain of right knee 07/24/2022   Elevated blood pressure reading in office without diagnosis of hypertension 11/21/2021   Heavy alcohol use 05/30/2019   Restless legs 05/30/2019   Allergic reaction 06/29/2018   GERD (gastroesophageal reflux disease) 03/03/2017   Trochanteric bursitis, right hip 10/21/2016   Osteoporosis 09/09/2016   Chronic back pain 07/15/2016   Decreased hearing 07/15/2016   Myalgia 10/08/2015   Arthritis 06/20/2015   HLD (hyperlipidemia) 06/20/2015   Metatarsalgia 09/27/2012   Allergic rhinitis with postnasal drip 06/16/2011    PCP: Dr. Vernona Rieger   REFERRING PROVIDER: Dr. Vernona Rieger   REFERRING DIAG: Unspecified low back pain, Lumbar radiculopathy   Rationale for Evaluation and Treatment: Rehabilitation  THERAPY DIAG:  Other low back pain  Muscle weakness (generalized)  ONSET DATE: 02/2014   SUBJECTIVE:  SUBJECTIVE STATEMENT: See pertinent history   PERTINENT HISTORY:  Pt would like to go by "Denise Parker". Pt reports having ongoing back pain since 2016 which she attributes to her degenerative disc disease. She has been doing physical therapy for the past several years off and on. Her most recent episode occurred after traveling where she felt that laying on different surfaces and sitting for prolonged times trigger symptoms. She know feels pain that radiates down her right hip and into right groin. She reports also hurting her right knee when going up and down stairs at a vacation home that had stairs to get up to loft. She describes the right knee and hip hurting her the most. Her knee has hurt her to the point where she has decreased her activity with decreased walking and high intensity exercise. She prefers laying on her right side, but she has had to lay on her  back to sleep because of right knee pain.   PAIN:  Are you having pain? Yes: NPRS scale: 1-2/10, 4-5/10  Pain location: Right side of low back and hip, Anterior right right knee over patella  Pain description: Achy  Aggravating factors: Keeping knee in the same position for long periods of time, and full weight bearing on right knee   Relieving factors: Iciing and Alieve and Voltaren; but all of these only work for a couple of hours   PRECAUTIONS: None  WEIGHT BEARING RESTRICTIONS: No  FALLS:  Has patient fallen in last 6 months? No  LIVING ENVIRONMENT: Lives with: lives with their spouse Lives in: House/apartment basement apartment Stairs: No  13 steps to first floor and going downstairs hurts worse. She does have railing on stairs  Has following equipment at home: None  OCCUPATION: Retired   PLOF: Independent  PATIENT GOALS: To relieve right hip and knee pain   NEXT MD VISIT: 07/30/2022 Dr. Patsy Lager for knee   OBJECTIVE:   BP 142/73 HR 61 SpO2 100%  DIAGNOSTIC FINDINGS:  CLINICAL DATA:  Right knee pain   EXAM: RIGHT KNEE - COMPLETE 4+ VIEW   COMPARISON:  None Available.   FINDINGS: Minimal enthesopathic changes off the superior patella. Lateral tilting of the patella on the sunrise view. Minimal degenerative changes in the medial compartment without loss of joint space. Mild degenerative changes in the patellofemoral compartment. No other abnormalities.   IMPRESSION: 1. Mild degenerative changes in the patellofemoral compartment. Minimal degenerative changes in the medial compartment without loss of joint space. 2. Lateral tilting of the patella on the sunrise view. 3. Minimal enthesopathic changes off the superior patella.     Electronically Signed   By: Gerome Sam III M.D.   On: 07/25/2022 13:00    PATIENT SURVEYS:  FOTO 56/100 with target of 70  SCREENING FOR RED FLAGS: Bowel or bladder incontinence: No Spinal tumors: No Cauda equina  syndrome: No Compression fracture: No Abdominal aneurysm: No  COGNITION: Overall cognitive status: Within functional limits for tasks assessed     SENSATION: WFL  MUSCLE LENGTH: Hamstrings: Right 90 deg; Left 90 deg Thomas test: Negative Bilaterally   POSTURE: rounded shoulders  PALPATION: Not performed   LUMBAR ROM:   AROM eval  Flexion 100%  Extension 100%*  Right lateral flexion 100%  Left lateral flexion 100%*  Right rotation 100%*  Left rotation 100%   (Blank rows = not tested)  LOWER EXTREMITY ROM:  ROM Right eval Left eval  Hip flexion    Hip extension    Hip abduction    Hip adduction    Hip internal rotation    Hip external rotation    Knee flexion    Knee extension    Ankle dorsiflexion    Ankle plantarflexion    Ankle inversion    Ankle eversion     (Blank rows = not tested)  LOWER EXTREMITY MMT:     Active  Right eval Left eval  Hip flexion 4- 4-  Hip extension 4- 4-  Hip abduction 4- 4-  Hip adduction    Hip internal rotation    Hip external rotation    Knee flexion 4+ 4+  Knee extension 4+ 4+  Ankle dorsiflexion 4+ 4+  Ankle plantarflexion    Ankle inversion    Ankle eversion     (Blank rows = not tested)    LUMBAR SPECIAL TESTS:  Straight leg raise test: Negative, FABER test: NT , and FADIR NT    GAIT: Distance walked: 50 feet  Assistive device utilized: None Level of assistance: Complete Independence Comments: Not gait deficits noted   TODAY'S TREATMENT:                                                                                                                              DATE:   07/29/22: Prone Quad Stretch 2 x 30 sec  Modified Thomas Test 2 x 30 sec   Supine Bridges 1 x 10   ABDOMINAL MMT  5 Normal Pelvic tilt & lower legs 45-30-15-0 4+ Good + Pelvic tilt & lower an extended leg 04 Good Pelvic tilt & lower an extended leg 15 4- Good - Pelvic tilt & lower an extended leg  30 3+ Fair + Pelvic tilt & extend leg x 10 3 Fair Pelvic tilt & extend leg x 5 3- Fair - Pelvic tilt & extend leg x 1 2+ Poor + Pelvic tilt x 10 2 Poor Pelvic tilt x 5 2- Poor - Pelvic tilt x 1 1 Trace Unable to perform pelvic tilt 0 Zero No contraction   PATIENT EDUCATION:  Education details: form and technique for correct form with exercise  Person educated: Patient Education method: Programmer, multimedia, Demonstration, Verbal cues, and Handouts Education comprehension: verbalized understanding, returned demonstration, verbal cues required, and needs further education  HOME EXERCISE PROGRAM: Access Code: [email or web address] URL: [email or web address] Date: 07/29/2022 Prepared by: Ellin Goodie  Exercises - Supine Bridge  - 3-4 x weekly - 3 sets - 10 reps - Prone Quadriceps Stretch with Strap  - 1 x daily - 3 reps - 60 sec hold - Modified Thomas Stretch  - 1 x daily - 3 reps - 30-60 sec  hold - Hooklying Sequential Leg March and Lower  - 3-4 x weekly - 3 sets - 10 reps  ASSESSMENT:  CLINICAL IMPRESSION: Patient is a 72 y.o. AA female who was seen today for  physical therapy evaluation and treatment for chronic right sided low back pain. Pt shows signs and symptoms that indicate she is most appropriate for movement control treatment based classification group with low pain and stable symptoms. She demonstrates decreased hip strength and flexibility and increased pain with movement. She will benefit from skilled PT to address these aforementioned deficits to return to her PLOF and to improve the quality of her life.   OBJECTIVE IMPAIRMENTS: difficulty walking, decreased ROM, decreased strength, impaired flexibility, postural dysfunction, and pain.   ACTIVITY LIMITATIONS: carrying, lifting, bending, standing, squatting, and stairs  PARTICIPATION LIMITATIONS: driving and community activity  PERSONAL FACTORS: Age, Past/current experiences, Time since onset of  injury/illness/exacerbation.  are also affecting patient's functional outcome.   REHAB POTENTIAL: Good  CLINICAL DECISION MAKING: Stable/uncomplicated  EVALUATION COMPLEXITY: Low   GOALS: Goals reviewed with patient? No  SHORT TERM GOALS: Target date: 08/12/2022  Pt will be independent with HEP in order to improve strength and balance in order to decrease fall risk and improve function at home and work. Baseline: NT  Goal status: INITIAL    LONG TERM GOALS: Target date: 10/07/2022  Patient will have improved function and activity level as evidenced by an increase in FOTO score by 10 points or more.  Baseline: 56/100 with target of 70 Goal status: INITIAL  2.  Patient will improve hip strength by 1/3 grade MMT (ie 4- to 4) for improved lumbar stability and reduction in symptoms and return to PLOF.  Baseline: Hip Flex R/L 4-/4-, Hip Ext R/L 4-/4-, Hip Abd R/L 4-/4- Goal status: INITIAL  3.  Patient will improve abdominal strength by 1/3 grade MMT (ie 4- to 4) for improve lumbar stability and reduction in symptoms and return to PLOF.  Baseline: NT  Goal status: INITIAL  PLAN:  PT FREQUENCY: 1-2x/week  PT DURATION: 10 weeks  PLANNED INTERVENTIONS: Therapeutic exercises, Therapeutic activity, Neuromuscular re-education, Balance training, Gait training, Patient/Family education, Joint mobilization, Joint manipulation, Aquatic Therapy, Dry Needling, Electrical stimulation, Spinal manipulation, Spinal mobilization, Cryotherapy, Moist heat, Manual therapy, and Re-evaluation.  PLAN FOR NEXT SESSION: Hip IR and ER, Abdominal testing, FADIR and FABER. Progress hip and abdominal strengthening.   Ellin Goodie PT, DPT  07/29/2022, 1:03 PM

## 2022-07-29 NOTE — Progress Notes (Unsigned)
    Steven Veazie T. Valerio Pinard, MD, CAQ Sports Medicine [facility] [address] [place], [address]  Phone: [phone]  FAX: [phone]  Denise Parker - 72 y.o. female  MRN [id number]  Date of Birth: 11-03-1950  Date: 07/30/2022  PCP: Doreene Nest, NP  Referral: Doreene Nest, NP  No chief complaint on file.  Subjective:   Denise Parker is a 72 y.o. very pleasant female patient with There is no height or weight on file to calculate BMI. who presents with the following:  She is a very pleasant 72 year old lady, she presents with some ongoing right-sided knee pain.  She is here in the office to receive consultation by Dr. Chestine Spore.  She is historically quite active.  Almost 2 weeks ago, she was doing her normal 2 mile walk in the morning, subsequently she went to Zumba class and tai chi, and during the Zumba class she developed some medial knee pain.  On review of her plain films from last week, she does have some early degenerative joint disease.    Review of Systems is noted in the HPI, as appropriate  Objective:   There were no vitals taken for this visit.  GEN: No acute distress; alert,appropriate. PULM: Breathing comfortably in no respiratory distress PSYCH: Normally interactive.   Laboratory and Imaging Data:  Assessment and Plan:   ***

## 2022-07-30 ENCOUNTER — Ambulatory Visit: Payer: Medicare Other | Admitting: Family Medicine

## 2022-07-30 ENCOUNTER — Encounter: Payer: Self-pay | Admitting: Family Medicine

## 2022-07-30 VITALS — BP 132/70 | HR 62 | Temp 97.9°F | Ht 63.25 in | Wt 202.1 lb

## 2022-07-30 DIAGNOSIS — M25561 Pain in right knee: Secondary | ICD-10-CM

## 2022-07-30 DIAGNOSIS — M7050 Other bursitis of knee, unspecified knee: Secondary | ICD-10-CM

## 2022-07-30 DIAGNOSIS — M1711 Unilateral primary osteoarthritis, right knee: Secondary | ICD-10-CM

## 2022-07-30 MED ORDER — TRIAMCINOLONE ACETONIDE 40 MG/ML IJ SUSP
40.0000 mg | Freq: Once | INTRAMUSCULAR | Status: AC
Start: 2022-07-30 — End: 2022-07-30
  Administered 2022-07-30: 40 mg via INTRA_ARTICULAR

## 2022-08-04 ENCOUNTER — Encounter: Payer: 59 | Admitting: Physical Therapy

## 2022-08-05 ENCOUNTER — Ambulatory Visit: Payer: Medicare Other | Admitting: Physical Therapy

## 2022-08-05 DIAGNOSIS — M6281 Muscle weakness (generalized): Secondary | ICD-10-CM | POA: Diagnosis not present

## 2022-08-05 DIAGNOSIS — M545 Low back pain, unspecified: Secondary | ICD-10-CM | POA: Diagnosis not present

## 2022-08-05 DIAGNOSIS — M791 Myalgia, unspecified site: Secondary | ICD-10-CM | POA: Diagnosis not present

## 2022-08-05 DIAGNOSIS — M5459 Other low back pain: Secondary | ICD-10-CM | POA: Diagnosis not present

## 2022-08-05 DIAGNOSIS — G8929 Other chronic pain: Secondary | ICD-10-CM | POA: Diagnosis not present

## 2022-08-05 NOTE — Therapy (Signed)
OUTPATIENT PHYSICAL THERAPY TREATMENT NOTE   Patient Name: Denise Parker MRN: [id number] DOB:February 09, 1951, 72 y.o., female Today's Date: 08/05/2022  PCP: Dr. Vernona Rieger  REFERRING PROVIDER: Dr. Vernona Rieger   END OF SESSION:   PT End of Session - 08/05/22 1522     Visit Number 3    Number of Visits 20    Date for PT Re-Evaluation 10/07/22    Authorization Type Medicare & [employer]    Authorization - Visit Number 3    Authorization - Number of Visits 20    Progress Note Due on Visit 10    PT Start Time 1520    PT Stop Time 1600    PT Time Calculation (min) 40 min    Activity Tolerance Patient tolerated treatment well    Behavior During Therapy [facility] for tasks assessed/performed             Past Medical History:  Diagnosis Date   Arthritis    Bronchitis 01/30/2021   Bursitis    back   Chronic gastritis without bleeding 05/30/2019   Cough in adult 01/30/2021   Herpes zoster 04/13/2013   HLD (hyperlipidemia)    HOH (hard of hearing)    Urinary frequency 11/21/2021   Past Surgical History:  Procedure Laterality Date   BREAST CYST ASPIRATION Left 90s   benign   CATARACT EXTRACTION W/PHACO Left 03/26/2017   Procedure: CATARACT EXTRACTION PHACO AND INTRAOCULAR LENS PLACEMENT (IOC);  Surgeon: Nevada Crane, MD;  Location: [facility] ORS;  Service: Ophthalmology;  Laterality: Left;   fluid pack lot #   [id number] [place]    00:23.1 AP%  7.5 CDE   1.71    CATARACT EXTRACTION W/PHACO Right 07/30/2017   Procedure: CATARACT EXTRACTION PHACO AND INTRAOCULAR LENS PLACEMENT (IOC);  Surgeon: Nevada Crane, MD;  Location: [facility] ORS;  Service: Ophthalmology;  Laterality: Right;  fluid pack lot #  [id number]  Exp  03/26/2018 [place]   00:32.1 AP%   5.9 CDE   1.81   COLONOSCOPY WITH PROPOFOL N/A 09/11/2017   Procedure: COLONOSCOPY WITH PROPOFOL;  Surgeon: Pasty Spillers, MD;  Location: [facility] ENDOSCOPY;  Service: Gastroenterology;  Laterality: N/A;   TONSILLECTOMY     TUBAL  LIGATION     Patient Active Problem List   Diagnosis Date Noted   Acute pain of right knee 07/24/2022   Elevated blood pressure reading in office without diagnosis of hypertension 11/21/2021   Heavy alcohol use 05/30/2019   Restless legs 05/30/2019   Allergic reaction 06/29/2018   GERD (gastroesophageal reflux disease) 03/03/2017   Trochanteric bursitis, right hip 10/21/2016   Osteoporosis 09/09/2016   Chronic back pain 07/15/2016   Decreased hearing 07/15/2016   Myalgia 10/08/2015   Arthritis 06/20/2015   HLD (hyperlipidemia) 06/20/2015   Metatarsalgia 09/27/2012   Allergic rhinitis with postnasal drip 06/16/2011    REFERRING DIAG: M54.50,G89.29 (ICD-10-CM) - Chronic low back pain, unspecified back pain laterality, unspecified whether sciatica present   THERAPY DIAG:  Other low back pain  Muscle weakness (generalized)  Rationale for Evaluation and Treatment Rehabilitation  PERTINENT HISTORY: Pt would like to go by "Denise Parker". Pt reports having ongoing back pain since 2016 which she attributes to her degenerative disc disease. She has been doing physical therapy for the past several years off and on. Her most recent episode occurred after traveling where she felt that laying on different surfaces and sitting for prolonged times trigger symptoms. She know feels pain that radiates down her right hip and  into right groin. She reports also hurting her right knee when going up and down stairs at a vacation home that had stairs to get up to loft. She describes the right knee and hip hurting her the most. Her knee has hurt her to the point where she has decreased her activity with decreased walking and high intensity exercise. She prefers laying on her right side, but she has had to lay on her back to sleep because of right knee pain.   PRECAUTIONS: None   SUBJECTIVE:                                                                                                                                                                                       SUBJECTIVE STATEMENT:  Pt reports that the thomas stretch and quad stretch have been really difficult. She reports that laying on her left side causes her back pain to come on. She also avoid vacuuming and raking because this tends to bring on her low back pain. She recently received a cortisone shot in her right knee and she has experienced significant relief especially with walking.    PAIN:  Are you having pain? Yes: NPRS scale: 2-3/10 Pain location: Right knee  Pain description: Achy  Aggravating factors: Water aerobic- flutter kick   Relieving factors: Not extending knee    OBJECTIVE: (objective measures completed at initial evaluation unless otherwise dated)    BP 142/73 HR 61 SpO2 100%   DIAGNOSTIC FINDINGS:  CLINICAL DATA:  Right knee pain   EXAM: RIGHT KNEE - COMPLETE 4+ VIEW   COMPARISON:  None Available.   FINDINGS: Minimal enthesopathic changes off the superior patella. Lateral tilting of the patella on the sunrise view. Minimal degenerative changes in the medial compartment without loss of joint space. Mild degenerative changes in the patellofemoral compartment. No other abnormalities.   IMPRESSION: 1. Mild degenerative changes in the patellofemoral compartment. Minimal degenerative changes in the medial compartment without loss of joint space. 2. Lateral tilting of the patella on the sunrise view. 3. Minimal enthesopathic changes off the superior patella.     Electronically Signed   By: Gerome Sam III M.D.   On: 07/25/2022 13:00     PATIENT SURVEYS:  FOTO 56/100 with target of 70   SCREENING FOR RED FLAGS: Bowel or bladder incontinence: No Spinal tumors: No Cauda equina syndrome: No Compression fracture: No Abdominal aneurysm: No   COGNITION: Overall cognitive status: Within functional limits for tasks assessed                          SENSATION: [facility]  MUSCLE LENGTH: Hamstrings:  Right 90 deg; Left 90 deg Thomas test: Negative Bilaterally    POSTURE: rounded shoulders   PALPATION: Not performed    LUMBAR ROM:    AROM eval  Flexion 100%  Extension 100%*  Right lateral flexion 100%  Left lateral flexion 100%*  Right rotation 100%*  Left rotation 100%   (Blank rows = not tested)   LOWER EXTREMITY ROM:                            ROM Right eval Left eval  Hip flexion      Hip extension      Hip abduction      Hip adduction      Hip internal rotation      Hip external rotation      Knee flexion      Knee extension      Ankle dorsiflexion      Ankle plantarflexion      Ankle inversion      Ankle eversion       (Blank rows = not tested)   LOWER EXTREMITY MMT:       Active  Right eval Left eval  Hip flexion 4- 4-  Hip extension 4- 4-  Hip abduction 4- 4-  Hip adduction      Hip internal rotation      Hip external rotation      Knee flexion 4+ 4+  Knee extension 4+ 4+  Ankle dorsiflexion 4+ 4+  Ankle plantarflexion      Ankle inversion      Ankle eversion       (Blank rows = not tested)       LUMBAR SPECIAL TESTS:  Straight leg raise test: Negative, FABER test: NT , and FADIR NT      GAIT: Distance walked: 50 feet  Assistive device utilized: None Level of assistance: Complete Independence Comments: Not gait deficits noted    TODAY'S TREATMENT:                                                                                                                              DATE:    08/05/22: Nu-Step Seat and arms at 8 with resistance at 3 for 5 min  Hip MMT:     IR R/L 4+/4+     ER R/L 4+/4+   ABDOMINAL MMT  4- Good - Pelvic tilt & lower an extended leg 30 Hooklying Toes taps 3 x 5  Thomas Stretch on End of Mat 2 x 30 sec  Side Lying Prone Stretch 2 x 30 sec  -Pt reports decreased sensation compared to prone quad stretch   Mini-Squat 3 x 10  -min VC to maintain upright posture and keep knees behind toes    07/29/22: Prone Quad Stretch 2 x 30 sec  Modified Thomas Test 2 x 30 sec   Supine Bridges 1  x 10    ABDOMINAL MMT  5 Normal Pelvic tilt & lower legs 45-30-15-0 4+ Good + Pelvic tilt & lower an extended leg 04 Good Pelvic tilt & lower an extended leg 15 4- Good - Pelvic tilt & lower an extended leg 30 3+ Fair + Pelvic tilt & extend leg x 10 3 Fair Pelvic tilt & extend leg x 5 3- Fair - Pelvic tilt & extend leg x 1 2+ Poor + Pelvic tilt x 10 2 Poor Pelvic tilt x 5 2- Poor - Pelvic tilt x 1 1 Trace Unable to perform pelvic tilt 0 Zero No contraction     PATIENT EDUCATION:  Education details: form and technique for correct form with exercise  Person educated: Patient Education method: Programmer, multimedia, Demonstration, Verbal cues, and Handouts Education comprehension: verbalized understanding, returned demonstration, verbal cues required, and needs further education   HOME EXERCISE PROGRAM: Access Code: [email or web address] URL: [email or web address] Date: 08/05/2022 Prepared by: Ellin Goodie  Exercises - Prone Quadriceps Stretch with Strap  - 1 x daily - 3 reps - 60 sec hold - Hooklying Sequential Leg March and Lower  - 3-4 x weekly - 3 sets - 5-10 reps - Thomas Stretch on Table  - 1 x daily - 3 reps - 30-60 sec  hold - Mini Squat  - 3-4 x weekly - 3 sets - 10 reps ASSESSMENT:   CLINICAL IMPRESSION: Pt presents for first treatment after initial eval for chronic low back pain. She shows improved pain response to weight bearing activity as evidenced by ability to perform mini-squats. She also demonstrates abdominal weakness with lumbar lordosis with leg lifts. Pt was able to complete all exercises without an increase in her symptoms. She will continue to benefit from skilled PT to address these aforementioned deficits to return to her PLOF and to improve the quality of her life.    OBJECTIVE IMPAIRMENTS: difficulty walking, decreased ROM, decreased strength, impaired  flexibility, postural dysfunction, and pain.    ACTIVITY LIMITATIONS: carrying, lifting, bending, standing, squatting, and stairs   PARTICIPATION LIMITATIONS: driving and community activity   PERSONAL FACTORS: Age, Past/current experiences, Time since onset of injury/illness/exacerbation.  are also affecting patient's functional outcome.    REHAB POTENTIAL: Good   CLINICAL DECISION MAKING: Stable/uncomplicated   EVALUATION COMPLEXITY: Low     GOALS: Goals reviewed with patient? No   SHORT TERM GOALS: Target date: 08/12/2022   Pt will be independent with HEP in order to improve strength and balance in order to decrease fall risk and improve function at home and work. Baseline: 08/05/22  Goal status: Achieved        LONG TERM GOALS: Target date: 10/07/2022   Patient will have improved function and activity level as evidenced by an increase in FOTO score by 10 points or more.  Baseline: 56/100 with target of 70 Goal status: Ongoing    2.  Patient will improve hip strength by 1/3 grade MMT (ie 4- to 4) for improved lumbar stability and reduction in symptoms and return to PLOF.  Baseline: Hip Flex R/L 4-/4-, Hip Ext R/L 4-/4-, Hip Abd R/L 4-/4-, Hip ER R/L4/4, Hip IR R/L 4/4  Goal status: Ongoing    3.  Patient will improve abdominal strength by 1/3 grade MMT (ie 4- to 4) for improve lumbar stability and reduction in symptoms and return to PLOF.  Baseline: Grade 4- on Sahramann  Goal status: Ongoing    PLAN:   PT FREQUENCY: 1-2x/week   PT DURATION: 10  weeks   PLANNED INTERVENTIONS: Therapeutic exercises, Therapeutic activity, Neuromuscular re-education, Balance training, Gait training, Patient/Family education, Joint mobilization, Joint manipulation, Aquatic Therapy, Dry Needling, Electrical stimulation, Spinal manipulation, Spinal mobilization, Cryotherapy, Moist heat, Manual therapy, and Re-evaluation.   PLAN FOR NEXT SESSION:  Progress hip and abdominal strengthening:  weight mini-squats and hook lying to taps. Standing hip abduction.      Ellin Goodie PT, DPT  08/05/2022, 3:23 PM

## 2022-08-06 ENCOUNTER — Encounter: Payer: 59 | Admitting: Physical Therapy

## 2022-08-11 ENCOUNTER — Encounter: Payer: 59 | Admitting: Physical Therapy

## 2022-08-12 ENCOUNTER — Ambulatory Visit: Payer: Medicare Other | Admitting: Physical Therapy

## 2022-08-12 DIAGNOSIS — M5459 Other low back pain: Secondary | ICD-10-CM

## 2022-08-12 DIAGNOSIS — G8929 Other chronic pain: Secondary | ICD-10-CM | POA: Diagnosis not present

## 2022-08-12 DIAGNOSIS — M6281 Muscle weakness (generalized): Secondary | ICD-10-CM | POA: Diagnosis not present

## 2022-08-12 DIAGNOSIS — M545 Low back pain, unspecified: Secondary | ICD-10-CM | POA: Diagnosis not present

## 2022-08-12 DIAGNOSIS — M791 Myalgia, unspecified site: Secondary | ICD-10-CM | POA: Diagnosis not present

## 2022-08-12 NOTE — Therapy (Signed)
OUTPATIENT PHYSICAL THERAPY TREATMENT NOTE   Patient Name: Denise Parker MRN: [id number] DOB:[birth year], 72 y.o., female Today's Date: 08/12/2022  PCP: Dr. Vernona Rieger  REFERRING PROVIDER: Dr. Vernona Rieger   END OF SESSION:   PT End of Session - 08/12/22 1650     Visit Number 4    Number of Visits 20    Date for PT Re-Evaluation 10/07/22    Authorization Type Medicare & [employer]et[employer]    Authorization - Visit Number 4    Authorization - Number of Visits 20    Progress Note Due on Visit 10    PT Start Time 1600    PT Stop Time 1645    PT Time Calculation (min) 45 min    Activity Tolerance Patient tolerated treatment well    Behavior During Therapy [facility] for tasks assessed/performed              Past Medical History:  Diagnosis Date   Arthritis    Bronchitis 01/30/2021   Bursitis    back   Chronic gastritis without bleeding 05/30/2019   Cough in adult 01/30/2021   Herpes zoster 04/13/2013   HLD (hyperlipidemia)    HOH (hard of hearing)    Urinary frequency 11/21/2021   Past Surgical History:  Procedure Laterality Date   BREAST CYST ASPIRATION Left 90s   benign   CATARACT EXTRACTION W/PHACO Left 03/26/2017   Procedure: CATARACT EXTRACTION PHACO AND INTRAOCULAR LENS PLACEMENT (IOC);  Surgeon: Nevada Crane, MD;  Location: [facility] ORS;  Service: Ophthalmology;  Laterality: Left;   fluid pack lot #   [id number] H [place]    00:23.1 AP%  7.5 CDE   1.71    CATARACT EXTRACTION W/PHACO Right 07/30/2017   Procedure: CATARACT EXTRACTION PHACO AND INTRAOCULAR LENS PLACEMENT (IOC);  Surgeon: Nevada Crane, MD;  Location: [facility] ORS;  Service: Ophthalmology;  Laterality: Right;  fluid pack lot #  [id number]  Exp  03/26/2018 [place]   00:32.1 AP%   5.9 CDE   1.81   COLONOSCOPY WITH PROPOFOL N/A 09/11/2017   Procedure: COLONOSCOPY WITH PROPOFOL;  Surgeon: Pasty Spillers, MD;  Location: [facility] ENDOS[facility];  Service: Gastroenterology;  Laterality: N/A;   TONSILLECTOMY     TUBAL  LIGATION     Patient Active Problem List   Diagnosis Date Noted   Acute pain of right knee 07/24/2022   Elevated blood pressure reading in office without diagnosis of hypertension 11/21/2021   Heavy alcohol use 05/30/2019   Restless legs 05/30/2019   Allergic reaction 06/29/2018   GERD (gastroesophageal reflux disease) 03/03/2017   Trochanteric bursitis, right hip 10/21/2016   Osteoporosis 09/09/2016   Chronic back pain 07/15/2016   Decreased hearing 07/15/2016   Myalgia 10/08/2015   Arthritis 06/20/2015   HLD (hyperlipidemia) 06/20/2015   Metatarsalgia 09/27/2012   Allergic rhinitis with postnasal drip 06/16/2011    REFERRING DIAG: M54.50,G89.29 (ICD-10-CM) - Chronic low back pain, unspecified back pain laterality, unspecified whether sciatica present   THERAPY DIAG:  No diagnosis found.  Rationale for Evaluation and Treatment Rehabilitation  PERTINENT HISTORY: Pt would like to go by "[name]". Pt reports having ongoing back pain since 2016 which she attributes to her degenerative disc disease. She has been doing physical therapy for the past several years off and on. Her most recent episode occurred after traveling where she felt that laying on different surfaces and sitting for prolonged times trigger symptoms. She know feels pain that radiates down her right hip and into right groin. She  reports also hurting her right knee when going up and down stairs at a vacation home that had stairs to get up to loft. She describes the right knee and hip hurting her the most. Her knee has hurt her to the point where she has decreased her activity with decreased walking and high intensity exercise. She prefers laying on her right side, but she has had to lay on her back to sleep because of right knee pain.   PRECAUTIONS: None   SUBJECTIVE:                                                                                                                                                                                       SUBJECTIVE STATEMENT:  Pt states that her right knee has been paining her since walking down hill. She had a prior (2 weeks ago) cortisone injection in the right knee which resolved pain but it is now starting to pain her in the medial side of the knee and quad into quad tendon.    PAIN:  Are you having pain? No   OBJECTIVE: (objective measures completed at initial evaluation unless otherwise dated)    BP 142/73 HR 61 SpO2 100%   DIAGNOSTIC FINDINGS:  CLINICAL DATA:  Right knee pain   EXAM: RIGHT KNEE - COMPLETE 4+ VIEW   COMPARISON:  None Available.   FINDINGS: Minimal enthesopathic changes off the superior patella. Lateral tilting of the patella on the sunrise view. Minimal degenerative changes in the medial compartment without loss of joint space. Mild degenerative changes in the patellofemoral compartment. No other abnormalities.   IMPRESSION: 1. Mild degenerative changes in the patellofemoral compartment. Minimal degenerative changes in the medial compartment without loss of joint space. 2. Lateral tilting of the patella on the sunrise view. 3. Minimal enthesopathic changes off the superior patella.     Electronically Signed   By: Gerome Sam III M.D.   On: 07/25/2022 13:00     PATIENT SURVEYS:  FOTO 56/100 with target of 70   SCREENING FOR RED FLAGS: Bowel or bladder incontinence: No Spinal tumors: No Cauda equina syndrome: No Compression fracture: No Abdominal aneurysm: No   COGNITION: Overall cognitive status: Within functional limits for tasks assessed                          SENSATION: WFL   MUSCLE LENGTH: Hamstrings: Right 90 deg; Left 90 deg Thomas test: Negative Bilaterally    POSTURE: rounded shoulders   PALPATION: Not performed    LUMBAR ROM:    AROM eval  Flexion 100%  Extension 100%*  Right lateral  flexion 100%  Left lateral flexion 100%*  Right rotation 100%*  Left rotation 100%   (Blank rows = not  tested)   LOWER EXTREMITY ROM:                            ROM Right eval Left eval  Hip flexion      Hip extension      Hip abduction      Hip adduction      Hip internal rotation      Hip external rotation      Knee flexion      Knee extension      Ankle dorsiflexion      Ankle plantarflexion      Ankle inversion      Ankle eversion       (Blank rows = not tested)   LOWER EXTREMITY MMT:       Active  Right eval Left eval  Hip flexion 4- 4-  Hip extension 4- 4-  Hip abduction 4- 4-  Hip adduction      Hip internal rotation      Hip external rotation      Knee flexion 4+ 4+  Knee extension 4+ 4+  Ankle dorsiflexion 4+ 4+  Ankle plantarflexion      Ankle inversion      Ankle eversion       (Blank rows = not tested)       LUMBAR SPECIAL TESTS:  Straight leg raise test: Negative, FABER test: NT , and FADIR NT      GAIT: Distance walked: 50 feet  Assistive device utilized: None Level of assistance: Complete Independence Comments: Not gait deficits noted    TODAY'S TREATMENT:                                                                                                                              DATE:    08/12/22: Nu-Step Seat and arms at 8 with resistance at 3 for 5 min  Standing Marches #3 1 x 10  Standing Marches with water jugs 2 x 10  Standing Hip Abduction with BUE support 1 x 10  Standing Hip Abduction with #3 AW and BUE support 2 x 10    08/05/22: Nu-Step Seat and arms at 8 with resistance at 3 for 5 min  Hip MMT:     IR R/L 4+/4+     ER R/L 4+/4+   ABDOMINAL MMT  4- Good - Pelvic tilt & lower an extended leg 30 Hooklying Toes taps 3 x 5  Thomas Stretch on End of Mat 2 x 30 sec  Side Lying Prone Stretch 2 x 30 sec  -Pt reports decreased sensation compared to prone quad stretch   Mini-Squat 3 x 10  -min VC to maintain upright posture and keep knees behind toes   07/29/22: Prone Quad Stretch 2 x 30 sec  Modified Thomas Test 2 x 30  sec   Supine Bridges 1 x 10    ABDOMINAL MMT  5 Normal Pelvic tilt & lower legs 45-30-15-0 4+ Good + Pelvic tilt & lower an extended leg 04 Good Pelvic tilt & lower an extended leg 15 4- Good - Pelvic tilt & lower an extended leg 30 3+ Fair + Pelvic tilt & extend leg x 10 3 Fair Pelvic tilt & extend leg x 5 3- Fair - Pelvic tilt & extend leg x 1 2+ Poor + Pelvic tilt x 10 2 Poor Pelvic tilt x 5 2- Poor - Pelvic tilt x 1 1 Trace Unable to perform pelvic tilt 0 Zero No contraction     PATIENT EDUCATION:  Education details: form and technique for correct form with exercise  Person educated: Patient Education method: Programmer, multimedia, Demonstration, Verbal cues, and Handouts Education comprehension: verbalized understanding, returned demonstration, verbal cues required, and needs further education   HOME EXERCISE PROGRAM: Access Code: [email or web address] URL: [email or web address] Date: 08/12/2022 Prepared by: Ellin Goodie  Exercises - Prone Quadriceps Stretch with Strap  - 1 x daily - 3 reps - 60 sec hold - Hooklying Sequential Leg March and Lower  - 3-4 x weekly - 3 sets - 5-10 reps - Thomas Stretch on Table  - 1 x daily - 3 reps - 30-60 sec  hold - Mini Squat  - 3-4 x weekly - 3 sets - 10 reps - Standing Hip Flexion March  - 3-4 x weekly - 3 sets - 10 reps - Standing Hip Abduction with Counter Support  - 3-4 x weekly - 3 sets - 10 reps  ASSESSMENT:   CLINICAL IMPRESSION: Despite initial report of increased right knee pain, pt was able to perform all exercises without an increase in her right knee pain. Session focused on hip strengthening in sagittal and coronal plane. She will continue to benefit from skilled PT to address these aforementioned deficits to return to her PLOF and to improve the quality of her life.     OBJECTIVE IMPAIRMENTS: difficulty walking, decreased ROM, decreased strength, impaired flexibility, postural dysfunction, and pain.    ACTIVITY  LIMITATIONS: carrying, lifting, bending, standing, squatting, and stairs   PARTICIPATION LIMITATIONS: driving and community activity   PERSONAL FACTORS: Age, Past/current experiences, Time since onset of injury/illness/exacerbation.  are also affecting patient's functional outcome.    REHAB POTENTIAL: Good   CLINICAL DECISION MAKING: Stable/uncomplicated   EVALUATION COMPLEXITY: Low     GOALS: Goals reviewed with patient? No   SHORT TERM GOALS: Target date: 08/12/2022   Pt will be independent with HEP in order to improve strength and balance in order to decrease fall risk and improve function at home and work. Baseline: 08/05/22  Goal status: Achieved        LONG TERM GOALS: Target date: 10/07/2022   Patient will have improved function and activity level as evidenced by an increase in FOTO score by 10 points or more.  Baseline: 56/100 with target of 70 Goal status: Ongoing    2.  Patient will improve hip strength by 1/3 grade MMT (ie 4- to 4) for improved lumbar stability and reduction in symptoms and return to PLOF.  Baseline: Hip Flex R/L 4-/4-, Hip Ext R/L 4-/4-, Hip Abd R/L 4-/4-, Hip ER R/L4/4, Hip IR R/L 4/4  Goal status: Ongoing    3.  Patient will improve abdominal strength by 1/3 grade MMT (ie 4- to 4) for improve lumbar stability and reduction in  symptoms and return to PLOF.  Baseline: Grade 4- on Sahramann  Goal status: Ongoing    PLAN:   PT FREQUENCY: 1-2x/week   PT DURATION: 10 weeks   PLANNED INTERVENTIONS: Therapeutic exercises, Therapeutic activity, Neuromuscular re-education, Balance training, Gait training, Patient/Family education, Joint mobilization, Joint manipulation, Aquatic Therapy, Dry Needling, Electrical stimulation, Spinal manipulation, Spinal mobilization, Cryotherapy, Moist heat, Manual therapy, and Re-evaluation.   PLAN FOR NEXT SESSION:  See how pt reacts to Omega knee ext and hs curl. Progress hip and abdominal strengthening: weight  mini-squats, paloff press, and progress abdominal HEP exercise.      Ellin Goodie PT, DPT  08/12/2022, 4:51 PM

## 2022-08-13 ENCOUNTER — Encounter: Payer: 59 | Admitting: Physical Therapy

## 2022-08-14 ENCOUNTER — Encounter: Payer: 59 | Admitting: Physical Therapy

## 2022-08-19 ENCOUNTER — Encounter: Payer: 59 | Admitting: Physical Therapy

## 2022-08-20 ENCOUNTER — Ambulatory Visit: Payer: Medicare Other

## 2022-08-20 VITALS — Ht 64.0 in | Wt 199.0 lb

## 2022-08-20 DIAGNOSIS — M6281 Muscle weakness (generalized): Secondary | ICD-10-CM | POA: Diagnosis not present

## 2022-08-20 DIAGNOSIS — M5459 Other low back pain: Secondary | ICD-10-CM

## 2022-08-20 DIAGNOSIS — G8929 Other chronic pain: Secondary | ICD-10-CM | POA: Diagnosis not present

## 2022-08-20 DIAGNOSIS — Z0000 Encounter for general adult medical examination without abnormal findings: Secondary | ICD-10-CM

## 2022-08-20 DIAGNOSIS — M545 Low back pain, unspecified: Secondary | ICD-10-CM | POA: Diagnosis not present

## 2022-08-20 DIAGNOSIS — M791 Myalgia, unspecified site: Secondary | ICD-10-CM | POA: Diagnosis not present

## 2022-08-20 NOTE — Patient Instructions (Signed)
Denise Parker , Thank you for taking time to come for your Medicare Wellness Visit. I appreciate your ongoing commitment to your health goals. Please review the following plan we discussed and let me know if I can assist you in the future.   These are the goals we discussed:  Goals      DIET - EAT MORE FRUITS AND VEGETABLES     Would like to get to 70% plant based diet     DIET - EAT MORE FRUITS AND VEGETABLES     Work towards 80% plant based     Increase physical activity     Starting 07/07/2016, I will continue to exercise for at least 45 min 3-4 days per week.       Patient Stated     Lose 60 pounds.     Weight (lb) < 146 lb (66.2 kg)        This is a list of the screening recommended for you and due dates:  Health Maintenance  Topic Date Due   DTaP/Tdap/Td vaccine (1 - Tdap) Never done   COVID-19 Vaccine (7 - 2023-24 season) 01/29/2022   Flu Shot  09/25/2022   Mammogram  10/25/2022   Medicare Annual Wellness Visit  08/20/2023   Colon Cancer Screening  09/12/2027   Pneumonia Vaccine  Completed   DEXA scan (bone density measurement)  Completed   Hepatitis C Screening  Completed   Zoster (Shingles) Vaccine  Completed   HPV Vaccine  Aged Out   Cologuard (Stool DNA test)  Discontinued    Advanced directives: on file in chart  Conditions/risks identified: none  Next appointment: Follow up in one year for your annual wellness visit 08/24/23 2:30 telephone call   Preventive Care 65 Years and Older, Female Preventive care refers to lifestyle choices and visits with your health care provider that can promote health and wellness. What does preventive care include? A yearly physical exam. This is also called an annual well check. Dental exams once or twice a year. Routine eye exams. Ask your health care provider how often you should have your eyes checked. Personal lifestyle choices, including: Daily care of your teeth and gums. Regular physical activity. Eating a healthy  diet. Avoiding tobacco and drug use. Limiting alcohol use. Practicing safe sex. Taking low-dose aspirin every day. Taking vitamin and mineral supplements as recommended by your health care provider. What happens during an annual well check? The services and screenings done by your health care provider during your annual well check will depend on your age, overall health, lifestyle risk factors, and family history of disease. Counseling  Your health care provider may ask you questions about your: Alcohol use. Tobacco use. Drug use. Emotional well-being. Home and relationship well-being. Sexual activity. Eating habits. History of falls. Memory and ability to understand (cognition). Work and work Astronomer. Reproductive health. Screening  You may have the following tests or measurements: Height, weight, and BMI. Blood pressure. Lipid and cholesterol levels. These may be checked every 5 years, or more frequently if you are over 47 years old. Skin check. Lung cancer screening. You may have this screening every year starting at age 40 if you have a 30-pack-year history of smoking and currently smoke or have quit within the past 15 years. Fecal occult blood test (FOBT) of the stool. You may have this test every year starting at age 6. Flexible sigmoidoscopy or colonoscopy. You may have a sigmoidoscopy every 5 years or a colonoscopy every 10 years  starting at age 38. Hepatitis C blood test. Hepatitis B blood test. Sexually transmitted disease (STD) testing. Diabetes screening. This is done by checking your blood sugar (glucose) after you have not eaten for a while (fasting). You may have this done every 1-3 years. Bone density scan. This is done to screen for osteoporosis. You may have this done starting at age 59. Mammogram. This may be done every 1-2 years. Talk to your health care provider about how often you should have regular mammograms. Talk with your health care provider about  your test results, treatment options, and if necessary, the need for more tests. Vaccines  Your health care provider may recommend certain vaccines, such as: Influenza vaccine. This is recommended every year. Tetanus, diphtheria, and acellular pertussis (Tdap, Td) vaccine. You may need a Td booster every 10 years. Zoster vaccine. You may need this after age 94. Pneumococcal 13-valent conjugate (PCV13) vaccine. One dose is recommended after age 32. Pneumococcal polysaccharide (PPSV23) vaccine. One dose is recommended after age 46. Talk to your health care provider about which screenings and vaccines you need and how often you need them. This information is not intended to replace advice given to you by your health care provider. Make sure you discuss any questions you have with your health care provider. Document Released: 03/09/2015 Document Revised: 10/31/2015 Document Reviewed: 12/12/2014 Elsevier Interactive Patient Education  2017 [employer].  Fall Prevention in the Home Falls can cause injuries. They can happen to people of all ages. There are many things you can do to make your home safe and to help prevent falls. What can I do on the outside of my home? Regularly fix the edges of walkways and driveways and fix any cracks. Remove anything that might make you trip as you walk through a door, such as a raised step or threshold. Trim any bushes or trees on the path to your home. Use bright outdoor lighting. Clear any walking paths of anything that might make someone trip, such as rocks or tools. Regularly check to see if handrails are loose or broken. Make sure that both sides of any steps have handrails. Any raised decks and porches should have guardrails on the edges. Have any leaves, snow, or ice cleared regularly. Use sand or salt on walking paths during winter. Clean up any spills in your garage right away. This includes oil or grease spills. What can I do in the bathroom? Use  night lights. Install grab bars by the toilet and in the tub and shower. Do not use towel bars as grab bars. Use non-skid mats or decals in the tub or shower. If you need to sit down in the shower, use a plastic, non-slip stool. Keep the floor dry. Clean up any water that spills on the floor as soon as it happens. Remove soap buildup in the tub or shower regularly. Attach bath mats securely with double-sided non-slip rug tape. Do not have throw rugs and other things on the floor that can make you trip. What can I do in the bedroom? Use night lights. Make sure that you have a light by your bed that is easy to reach. Do not use any sheets or blankets that are too big for your bed. They should not hang down onto the floor. Have a firm chair that has side arms. You can use this for support while you get dressed. Do not have throw rugs and other things on the floor that can make you trip. What  can I do in the kitchen? Clean up any spills right away. Avoid walking on wet floors. Keep items that you use a lot in easy-to-reach places. If you need to reach something above you, use a strong step stool that has a grab bar. Keep electrical cords out of the way. Do not use floor polish or wax that makes floors slippery. If you must use wax, use non-skid floor wax. Do not have throw rugs and other things on the floor that can make you trip. What can I do with my stairs? Do not leave any items on the stairs. Make sure that there are handrails on both sides of the stairs and use them. Fix handrails that are broken or loose. Make sure that handrails are as long as the stairways. Check any carpeting to make sure that it is firmly attached to the stairs. Fix any carpet that is loose or worn. Avoid having throw rugs at the top or bottom of the stairs. If you do have throw rugs, attach them to the floor with carpet tape. Make sure that you have a light switch at the top of the stairs and the bottom of the  stairs. If you do not have them, ask someone to add them for you. What else can I do to help prevent falls? Wear shoes that: Do not have high heels. Have rubber bottoms. Are comfortable and fit you well. Are closed at the toe. Do not wear sandals. If you use a stepladder: Make sure that it is fully opened. Do not climb a closed stepladder. Make sure that both sides of the stepladder are locked into place. Ask someone to hold it for you, if possible. Clearly mark and make sure that you can see: Any grab bars or handrails. First and last steps. Where the edge of each step is. Use tools that help you move around (mobility aids) if they are needed. These include: Canes. Walkers. Scooters. Crutches. Turn on the lights when you go into a dark area. Replace any light bulbs as soon as they burn out. Set up your furniture so you have a clear path. Avoid moving your furniture around. If any of your floors are uneven, fix them. If there are any pets around you, be aware of where they are. Review your medicines with your doctor. Some medicines can make you feel dizzy. This can increase your chance of falling. Ask your doctor what other things that you can do to help prevent falls. This information is not intended to replace advice given to you by your health care provider. Make sure you discuss any questions you have with your health care provider. Document Released: 12/07/2008 Document Revised: 07/19/2015 Document Reviewed: 03/17/2014 Elsevier Interactive Patient Education  2017 [employer].

## 2022-08-20 NOTE — Therapy (Signed)
OUTPATIENT PHYSICAL THERAPY TREATMENT    Patient Name: Denise Parker MRN: [id number] DOB:May 11, 1950, 72 y.o., female Today's Date: 08/20/2022  PCP: Dr. Vernona Rieger  REFERRING PROVIDER: Dr. Vernona Rieger   END OF SESSION:   PT End of Session - 08/20/22 1647     Visit Number 5    Number of Visits 20    Date for PT Re-Evaluation 10/07/22    Authorization Type Medicare & [employer]    Authorization Time Period Medicare primary; AARP secondary    Authorization - Visit Number 5    Authorization - Number of Visits 20    Progress Note Due on Visit 10    PT Start Time 1645    PT Stop Time 1725    PT Time Calculation (min) 40 min    Activity Tolerance Patient tolerated treatment well;No increased pain    Behavior During Therapy [facility] for tasks assessed/performed              Past Medical History:  Diagnosis Date   Arthritis    Bronchitis 01/30/2021   Bursitis    back   Chronic gastritis without bleeding 05/30/2019   Cough in adult 01/30/2021   Herpes zoster 04/13/2013   HLD (hyperlipidemia)    HOH (hard of hearing)    Urinary frequency 11/21/2021   Past Surgical History:  Procedure Laterality Date   BREAST CYST ASPIRATION Left 90s   benign   CATARACT EXTRACTION W/PHACO Left 03/26/2017   Procedure: CATARACT EXTRACTION PHACO AND INTRAOCULAR LENS PLACEMENT (IOC);  Surgeon: Nevada Crane, MD;  Location: [facility] ORS;  Service: Ophthalmology;  Laterality: Left;   fluid pack lot #   [id number] [place]    00:23.1 AP%  7.5 CDE   1.71    CATARACT EXTRACTION W/PHACO Right 07/30/2017   Procedure: CATARACT EXTRACTION PHACO AND INTRAOCULAR LENS PLACEMENT (IOC);  Surgeon: Nevada Crane, MD;  Location: [facility] ORS;  Service: Ophthalmology;  Laterality: Right;  fluid pack lot #  [id number]  Exp  03/26/2018 [place]   00:32.1 AP%   5.9 CDE   1.81   COLONOSCOPY WITH PROPOFOL N/A 09/11/2017   Procedure: COLONOSCOPY WITH PROPOFOL;  Surgeon: Pasty Spillers, MD;  Location: [facility];   Service: Gastroenterology;  Laterality: N/A;   TONSILLECTOMY     TUBAL LIGATION     Patient Active Problem List   Diagnosis Date Noted   Acute pain of right knee 07/24/2022   Elevated blood pressure reading in office without diagnosis of hypertension 11/21/2021   Heavy alcohol use 05/30/2019   Restless legs 05/30/2019   Allergic reaction 06/29/2018   GERD (gastroesophageal reflux disease) 03/03/2017   Trochanteric bursitis, right hip 10/21/2016   Osteoporosis 09/09/2016   Chronic back pain 07/15/2016   Decreased hearing 07/15/2016   Myalgia 10/08/2015   Arthritis 06/20/2015   HLD (hyperlipidemia) 06/20/2015   Metatarsalgia 09/27/2012   Allergic rhinitis with postnasal drip 06/16/2011    REFERRING DIAG: M54.50,G89.29 (ICD-10-CM) - Chronic low back pain, unspecified back pain laterality, unspecified whether sciatica present   THERAPY DIAG:  Other low back pain  Muscle weakness (generalized)  Rationale for Evaluation and Treatment Rehabilitation  PERTINENT HISTORY: Pt reports having ongoing back pain since 2016 which she attributes to her degenerative disc disease. She has been doing physical therapy for the past several years off and on. Her most recent episode occurred after traveling where she felt that laying on different surfaces and sitting for prolonged times trigger symptoms. She know feels pain that  radiates down her right hip and into right groin. She reports also hurting her right knee when going up and down stairs at a vacation home that had stairs to get up to loft. She describes the right knee and hip hurting her the most. Her knee has hurt her to the point where she has decreased her activity with decreased walking and high intensity exercise. She prefers laying on her right side, but she has had to lay on her back to sleep because of right knee pain.   PRECAUTIONS: None   SUBJECTIVE:                                                                                                                                                                                       SUBJECTIVE STATEMENT:  Pt doing generally well, feels like her knee is on the mend, was more tolerant of an instance of down hill walking since previous PT session. Pt reports success with HEP in general.   PAIN:  Are you having pain? No   OBJECTIVE:   TODAY'S TREATMENT:                                                                                                                              DATE:    08/20/22: -Nustep Seat 8, arms 8, level 3 x 5 minutes (self selected speed 120s SPM)   -hip flexor lunge stretch with foot on 2nd step: 2x30 sec bilat (difficult to say if this is superior to the thomas test version, pt appears to have good psoas length and hip extension ROM, has to use more lumbar extension for stretch than goal)   -Standing Marches #3 1 x 15 bilat -Standing Marches with 8lbFW each hand 1 x 20 alternating  -Standing hip extension SLR from forward flexed posture (hands on treadbar) 1x20 bilat  -Standing marching in place 3lbAW bilat, 15lb axial medicine ball 1x20bilat   -Hooklying SAQ 1x15 c 3lb AW bilat thighs on blue foam roll -Hooklying RedTB 1x15, feet at 12" width  -Hooklying SAQ 1x15x3secH c 3lb AW bilat thighs on blue foam  roll -Hooklying GreenTB 1x15, feet at 12" width   ---------------------------------------- 08/12/22: Nu-Step Seat and arms at 8 with resistance at 3 for 5 min  Standing Marches #3 1 x 10  Standing Marches with water jugs 2 x 10  Standing Hip Abduction with BUE support 1 x 10  Standing Hip Abduction with #3 AW and BUE support 2 x 10   08/05/22: Nu-Step Seat and arms at 8 with resistance at 3 for 5 min  Hip MMT:     IR R/L 4+/4+     ER R/L 4+/4+  ABDOMINAL MMT 4- Good - Pelvic tilt & lower an extended leg 30 Hooklying Toes taps 3 x 5  Thomas Stretch on End of Mat 2 x 30 sec  Side Lying Prone Stretch 2 x 30 sec  -Pt reports decreased  sensation compared to prone quad stretch  Mini-Squat 3 x 10  -min VC to maintain upright posture and keep knees behind toes   07/29/22: Prone Quad Stretch 2 x 30 sec  Modified Thomas Test 2 x 30 sec   Supine Bridges 1 x 10    ABDOMINAL MMT  5 Normal Pelvic tilt & lower legs 45-30-15-0 4+ Good + Pelvic tilt & lower an extended leg 04 Good Pelvic tilt & lower an extended leg 15 4- Good - Pelvic tilt & lower an extended leg 30 3+ Fair + Pelvic tilt & extend leg x 10 3 Fair Pelvic tilt & extend leg x 5 3- Fair - Pelvic tilt & extend leg x 1 2+ Poor + Pelvic tilt x 10 2 Poor Pelvic tilt x 5 2- Poor - Pelvic tilt x 1 1 Trace Unable to perform pelvic tilt 0 Zero No contraction     PATIENT EDUCATION:  Education details: form and technique for correct form with exercise  Person educated: Patient Education method: Programmer, multimedia, Demonstration, Verbal cues, and Handouts Education comprehension: verbalized understanding, returned demonstration, verbal cues required, and needs further education   HOME EXERCISE PROGRAM: Access Code: [email or web address] URL: [email or web address] Date: 08/12/2022 Prepared by: Ellin Goodie  Exercises - Prone Quadriceps Stretch with Strap  - 1 x daily - 3 reps - 60 sec hold - Hooklying Sequential Leg March and Lower  - 3-4 x weekly - 3 sets - 5-10 reps - Thomas Stretch on Table  - 1 x daily - 3 reps - 30-60 sec  hold - Mini Squat  - 3-4 x weekly - 3 sets - 10 reps - Standing Hip Flexion March  - 3-4 x weekly - 3 sets - 10 reps - Standing Hip Abduction with Counter Support  - 3-4 x weekly - 3 sets - 10 reps  ASSESSMENT:   CLINICAL IMPRESSION: Continued with POC addressing hip/knee strength avoiding maximal compression at patellofemoral joint. She will continue to benefit from skilled PT to address these aforementioned deficits to return to her PLOF and to improve the quality of her life.     OBJECTIVE IMPAIRMENTS: difficulty walking,  decreased ROM, decreased strength, impaired flexibility, postural dysfunction, and pain.    ACTIVITY LIMITATIONS: carrying, lifting, bending, standing, squatting, and stairs   PARTICIPATION LIMITATIONS: driving and community activity   PERSONAL FACTORS: Age, Past/current experiences, Time since onset of injury/illness/exacerbation.  are also affecting patient's functional outcome.    REHAB POTENTIAL: Good   CLINICAL DECISION MAKING: Stable/uncomplicated   EVALUATION COMPLEXITY: Low     GOALS: Goals reviewed with patient? No   SHORT TERM GOALS: Target date: 08/12/2022   Pt will be independent  with HEP in order to improve strength and balance in order to decrease fall risk and improve function at home and work. Baseline: 08/05/22  Goal status: Achieved     LONG TERM GOALS: Target date: 10/07/2022   Patient will have improved function and activity level as evidenced by an increase in FOTO score by 10 points or more.  Baseline: 56/100 with target of 70 Goal status: Ongoing    2.  Patient will improve hip strength by 1/3 grade MMT (ie 4- to 4) for improved lumbar stability and reduction in symptoms and return to PLOF.  Baseline: Hip Flex R/L 4-/4-, Hip Ext R/L 4-/4-, Hip Abd R/L 4-/4-, Hip ER R/L4/4, Hip IR R/L 4/4  Goal status: Ongoing    3.  Patient will improve abdominal strength by 1/3 grade MMT (ie 4- to 4) for improve lumbar stability and reduction in symptoms and return to PLOF.  Baseline: Grade 4- on Sahramann  Goal status: Ongoing    PLAN:   PT FREQUENCY: 1-2x/week   PT DURATION: 10 weeks   PLANNED INTERVENTIONS: Therapeutic exercises, Therapeutic activity, Neuromuscular re-education, Balance training, Gait training, Patient/Family education, Joint mobilization, Joint manipulation, Aquatic Therapy, Dry Needling, Electrical stimulation, Spinal manipulation, Spinal mobilization, Cryotherapy, Moist heat, Manual therapy, and Re-evaluation.   PLAN FOR NEXT SESSION:  See how  pt reacts to Omega knee ext and hs curl. Progress hip and abdominal strengthening: weight mini-squats, paloff press, and progress abdominal HEP exercise.     4:49 PM, 08/20/22 Rosamaria Lints, PT, DPT Physical Therapist - [place] [phone] (Office)   08/20/2022, 4:49 PM

## 2022-08-20 NOTE — Progress Notes (Signed)
Subjective:   Denise Parker is a 72 y.o. female who presents for Medicare Annual (Subsequent) preventive examination.  Visit Complete: Virtual  I connected with  Denise Parker on 08/20/22 by a audio enabled telemedicine application and verified that I am speaking with the correct person using two identifiers.  Patient Location: Home  Provider Location: Office/Clinic  I discussed the limitations of evaluation and management by telemedicine. The patient expressed understanding and agreed to proceed.   Review of Systems      Cardiac Risk Factors include: advanced age (>24men, >38 women);dyslipidemia     Objective:    Today's Vitals   08/20/22 1459  Weight: 199 lb (90.3 kg)  Height: 5\' 4"  (1.626 m)   Body mass index is 34.16 kg/m.     08/20/2022    3:08 PM 07/29/2022   11:25 AM 06/10/2021    9:26 AM 06/06/2020    9:04 AM 09/11/2017    8:30 AM 07/30/2017   10:30 AM 07/08/2017    1:56 PM  Advanced Directives  Does Patient Have a Medical Advance Directive? Yes No No No No No No  Type of Estate agent of [place];Living will        Does patient want to make changes to medical advance directive? No - Patient declined        Copy of Healthcare Power of Attorney in Chart? Yes - validated most recent copy scanned in chart (See row information)        Would patient like information on creating a medical advance directive?  No - Patient declined Yes (MAU/Ambulatory/Procedural Areas - Information given) Yes (MAU/Ambulatory/Procedural Areas - Information given) No - Patient declined  Yes (MAU/Ambulatory/Procedural Areas - Information given)    Current Medications (verified) Outpatient Encounter Medications as of 08/20/2022  Medication Sig   atorvastatin (LIPITOR) 40 MG tablet Take 1 tablet (40 mg total) by mouth daily. for cholesterol.   Calcium Carbonate-Vit D-Min (CALTRATE 600+D PLUS MINERALS) 600-800 MG-UNIT TABS Take 2 tablets by mouth.   cetirizine  (ZYRTEC) 10 MG tablet Take 1 tablet (10 mg total) by mouth daily as needed for allergies.   COLLAGEN PO Take by mouth.   CRANBERRY-VITAMIN C PO Take 1 tablet by mouth daily.    denosumab (PROLIA) 60 MG/ML SOSY injection INJECT 60MG  SUBCUTANEOUSLY EVERY 6 MONTHS   fluorometholone (FML) 0.1 % ophthalmic suspension Place 1 drop into both eyes daily.   fluticasone (FLONASE) 50 MCG/ACT nasal spray Place 1 spray into both nostrils 2 (two) times daily as needed for allergies or rhinitis.   GLUCOSAMINE CHONDROITIN COMPLX PO Take 1 tablet by mouth daily.    MELATONIN PO Take by mouth.   Menthol, Topical Analgesic, (BIOFREEZE EX) Apply 1 application topically daily as needed (bursitis in the hip and back pain).   Turmeric 500 MG TABS Take 500 mg by mouth 3 (three) times daily.   vitamin E 400 UNIT capsule Take 400 Units by mouth daily.   No facility-administered encounter medications on file as of 08/20/2022.    Allergies (verified) Probiotic product   History: Past Medical History:  Diagnosis Date   Arthritis    Bronchitis 01/30/2021   Bursitis    back   Chronic gastritis without bleeding 05/30/2019   Cough in adult 01/30/2021   Herpes zoster 04/13/2013   HLD (hyperlipidemia)    HOH (hard of hearing)    Urinary frequency 11/21/2021   Past Surgical History:  Procedure Laterality Date   BREAST CYST  ASPIRATION Left 90s   benign   CATARACT EXTRACTION W/PHACO Left 03/26/2017   Procedure: CATARACT EXTRACTION PHACO AND INTRAOCULAR LENS PLACEMENT (IOC);  Surgeon: Nevada Crane, MD;  Location: [facility] ORS;  Service: Ophthalmology;  Laterality: Left;   fluid pack lot #   [id number] [place]    00:23.1 AP%  7.5 CDE   1.71    CATARACT EXTRACTION W/PHACO Right 07/30/2017   Procedure: CATARACT EXTRACTION PHACO AND INTRAOCULAR LENS PLACEMENT (IOC);  Surgeon: Nevada Crane, MD;  Location: [facility] OR[facility];  Service: Ophthalmology;  Laterality: Right;  fluid pack lot #  [id number]  Exp  03/26/2018 [place]    00:32.1 AP%   5.9 CDE   1.81   COLONOSCOPY WITH PROPOFOL N/A 09/11/2017   Procedure: COLONOSCOPY WITH PROPOFOL;  Surgeon: Pasty Spillers, MD;  Location: [facility];  Service: Gastroenterology;  Laterality: N/A;   TONSILLECTOMY     TUBAL LIGATION     Family History  Problem Relation Age of Onset   Heart disease Mother    Stroke Mother    Heart disease Father    Lupus Father    Breast cancer Sister 17   Leukemia Sister    Colon cancer Sister 54   Lupus Sister    Breast cancer Sister    Lupus Sister    Heart attack Brother 61   Social History   Socioeconomic History   Marital status: Married    Spouse name: Denise Parker   Number of children: 1   Years of education: IT sales professional   Highest education level: Master's degree (e.g., MA, MS, MEng, MEd, MSW, MBA)  Occupational History   Not on file  Tobacco Use   Smoking status: Never   Smokeless tobacco: Never  Vaping Use   Vaping Use: Never used  Substance and Sexual Activity   Alcohol use: Yes    Alcohol/week: 21.0 standard drinks of alcohol    Types: 21 Glasses of wine per week    Comment: Wine-regular   Drug use: No   Sexual activity: Yes    Birth control/protection: Surgical, Post-menopausal  Other Topics Concern   Not on file  Social History Narrative      Does not have a living will or HPOA   Desires CPR, would not want prolonged life support if futile.      05/30/19   From: originally from this area, but spent time in [place]   Living: with husband Denise Parker, and son and his family live upstairs   Work: retired from [employer], also use to teach [employer]      Family: Son - Denise Parker, has 5 grandchildren (2 grown in [place]) and 3 live here      Enjoys: reading, listen to books on tape, walk, hike, volunteering at the senior center      Exercise: walking - 5-7 times a week, 4 miles per day   Diet: pretty good, limits pork and beef, fish once a week      Safety   Seat belts: Yes    Guns: Yes   and secure   Safe in relationships: Yes       Social Determinants of Health   Financial Resource Strain: Low Risk  (08/20/2022)   Overall Financial Resource Strain (CARDIA)    Difficulty of Paying Living Expenses: Not hard at all  Food Insecurity: No Food Insecurity (08/20/2022)   Hunger Vital Sign    Worried About Running Out of Food in the Last Year: Never true  Ran Out of Food in the Last Year: Never true  Transportation Needs: No Transportation Needs (08/20/2022)   PRAPARE - Administrator, Civil Service (Medical): No    Lack of Transportation (Non-Medical): No  Physical Activity: Sufficiently Active (08/20/2022)   Exercise Vital Sign    Days of Exercise per Week: 6 days    Minutes of Exercise per Session: 120 min  Stress: No Stress Concern Present (08/20/2022)   [employer] of Occupational Health - Occupational Stress Questionnaire    Feeling of Stress : Not at all  Social Connections: Moderately Integrated (08/20/2022)   Social Connection and Isolation Panel [NHANES]    Frequency of Communication with Friends and Family: More than three times a week    Frequency of Social Gatherings with Friends and Family: More than three times a week    Attends Religious Services: Never    Database administrator or Organizations: Yes    Attends Engineer, structural: More than 4 times per year    Marital Status: Married    Tobacco Counseling Counseling given: Not Answered   Clinical Intake:  Pre-visit preparation completed: Yes  Pain : No/denies pain     BMI - recorded: 34.16 Nutritional Risks: None Diabetes: No  How often do you need to have someone help you when you read instructions, pamphlets, or other written materials from your doctor or pharmacy?: 1 - Never  Interpreter Needed?: No  Information entered by :: C.Shayle Donahoo LPN   Activities of Daily Living    08/20/2022    3:09 PM 08/20/2022    7:39 AM  In your present state of health, do you  have any difficulty performing the following activities:  Hearing? 1 1  Comment wears aids   Vision? 0 0  Difficulty concentrating or making decisions? 0 0  Walking or climbing stairs? 0 1  Dressing or bathing? 0 0  Doing errands, shopping? 0 0  Preparing Food and eating ? N N  Using the Toilet? N N  In the past six months, have you accidently leaked urine? N N  Do you have problems with loss of bowel control? N N  Managing your Medications? N N  Managing your Finances? N N  Housekeeping or managing your Housekeeping? N N    Patient Care Team: Doreene Nest, NP as PCP - General (Internal Medicine) Lemar Lofty, MD as Consulting Physician (Unknown Physician Specialty)  Indicate any recent Medical Services you may have received from other than [facility] providers in the past year (date may be approximate).     Assessment:   This is a routine wellness examination for Denise Parker.  Hearing/Vision screen Hearing Screening - Comments:: Wears aids Vision Screening - Comments:: Glasses - [facility] - UTD on eye exams  Dietary issues and exercise activities discussed:     Goals Addressed             This Visit's Progress    DIET - EAT MORE FRUITS AND VEGETABLES   On track    Would like to get to 70% plant based diet     Patient Stated       Lose 60 pounds.       Depression Screen    08/20/2022    3:07 PM 07/30/2022   10:03 AM 07/24/2022   10:54 AM 06/24/2022    1:53 PM 11/21/2021   11:02 AM 06/10/2021   11:29 AM 06/06/2020    9:07 AM  PHQ 2/9 Scores  PHQ - 2 Score 0 0 0 0 0 0 0  PHQ- 9 Score 0 1 1  0      Fall Risk    08/20/2022    3:09 PM 08/20/2022    7:39 AM 07/24/2022   10:54 AM 06/24/2022    1:53 PM 11/21/2021   11:03 AM  Fall Risk   Falls in the past year? 0 0 0 0 0  Number falls in past yr: 0  0 0 0  Injury with Fall? 0  0 0 0  Risk for fall due to : No Fall Risks  No Fall Risks No Fall Risks   Follow up Falls prevention discussed;Falls evaluation  completed  Falls evaluation completed Falls evaluation completed     MEDICARE RISK AT HOME:   TIMED UP AND GO:  Was the test performed?  No    Cognitive Function:    07/07/2016   10:04 AM 06/13/2015   11:55 AM  MMSE - Mini Mental State Exam  Orientation to time 5 5  Orientation to Place 5 5  Registration 3 3  Attention/ Calculation 0 0  Recall 3 3  Language- name 2 objects 0 0  Language- repeat 1 1  Language- follow 3 step command 3 3  Language- read & follow direction 0 0  Write a sentence 0 0  Copy design 0 0  Total score 20 20        08/20/2022    3:14 PM  6CIT Screen  What Year? 0 points  What month? 0 points  What time? 0 points  Count back from 20 0 points  Months in reverse 0 points  Repeat phrase 0 points  Total Score 0 points    Immunizations Immunization History  Administered Date(s) Administered   Fluad Quad(high Dose 65+) 12/02/2018, 11/30/2019, 12/21/2020, 11/14/2021   Influenza, High Dose Seasonal PF 03/24/2018   Influenza,inj,Quad PF,6+ Mos 02/08/2016, 03/03/2017   Influenza-Unspecified 12/08/2013   PFIZER Comirnaty(Gray Top)Covid-19 Tri-Sucrose Vaccine 12/04/2021   PFIZER(Purple Top)SARS-COV-2 Vaccination 04/10/2019, 05/10/2019, 12/07/2019, 06/15/2020   Pfizer Covid-19 Vaccine Bivalent Booster 108yrs & up 12/19/2020   Pneumococcal Conjugate-13 01/24/2014   Pneumococcal Polysaccharide-23 02/01/2019   Pneumococcal-Unspecified 11/23/2012   Zoster Recombinat (Shingrix) 07/28/2017, 09/29/2017   Zoster, Live 06/16/2011    TDAP status: Due, Education has been provided regarding the importance of this vaccine. Advised may receive this vaccine at local pharmacy or Health Dept. Aware to provide a copy of the vaccination record if obtained from local pharmacy or Health Dept. Verbalized acceptance and understanding.  Flu Vaccine status: Up to date  Pneumococcal vaccine status: Up to date  Covid-19 vaccine status: Completed vaccines  Qualifies for  Shingles Vaccine? Yes   Zostavax completed Yes   Shingrix Completed?: Yes  Screening Tests Health Maintenance  Topic Date Due   DTaP/Tdap/Td (1 - Tdap) Never done   COVID-19 Vaccine (7 - 2023-24 season) 01/29/2022   INFLUENZA VACCINE  09/25/2022   MAMMOGRAM  10/25/2022   Medicare Annual Wellness (AWV)  08/20/2023   Colonoscopy  09/12/2027   Pneumonia Vaccine 62+ Years old  Completed   DEXA SCAN  Completed   Hepatitis C Screening  Completed   Zoster Vaccines- Shingrix  Completed   HPV VACCINES  Aged Out   Fecal DNA (Cologuard)  Discontinued    Health Maintenance  Health Maintenance Due  Topic Date Due   DTaP/Tdap/Td (1 - Tdap) Never done   COVID-19 Vaccine (7 - 2023-24 season) 01/29/2022  Colorectal cancer screening: Type of screening: Colonoscopy. Completed 09/11/17. Repeat every 10 years  Mammogram status: Completed 10/24/21. Repeat every year Scheduled 10/28/22  Bone Density status: Completed 10/09/20. Results reflect: Bone density results: OSTEOPOROSIS. Repeat every 2 years. Scheduled 10/28/22  Lung Cancer Screening: (Low Dose CT Chest recommended if Age 58-80 years, 20 pack-year currently smoking OR have quit w/in 15years.) does not qualify.   Lung Cancer Screening Referral: n/a  Additional Screening:  Hepatitis C Screening: does qualify; Completed 06/14/15  Vision Screening: Recommended annual ophthalmology exams for early detection of glaucoma and other disorders of the eye. Is the patient up to date with their annual eye exam?  Yes  Who is the provider or what is the name of the office in which the patient attends annual eye exams? [facility] If pt is not established with a provider, would they like to be referred to a provider to establish care? Yes .   Dental Screening: Recommended annual dental exams for proper oral hygiene  Community Resource Referral / Chronic Care Management: CRR required this visit?  No   CCM required this visit?  No      Plan:     I have personally reviewed and noted the following in the patient's chart:   Medical and social history Use of alcohol, tobacco or illicit drugs  Current medications and supplements including opioid prescriptions. Patient is not currently taking opioid prescriptions. Functional ability and status Nutritional status Physical activity Advanced directives List of other physicians Hospitalizations, surgeries, and ER visits in previous 12 months Vitals Screenings to include cognitive, depression, and falls Referrals and appointments  In addition, I have reviewed and discussed with patient certain preventive protocols, quality metrics, and best practice recommendations. A written personalized care plan for preventive services as well as general preventive health recommendations were provided to patient.     Maryan Puls, LPN   [id number]/61/0960   After Visit Summary: (MyChart) Due to this being a telephonic visit, the after visit summary with patients personalized plan was offered to patient via MyChart   Nurse Notes: NONE

## 2022-08-21 ENCOUNTER — Encounter: Payer: 59 | Admitting: Physical Therapy

## 2022-08-25 ENCOUNTER — Encounter: Payer: Self-pay | Admitting: Physical Therapy

## 2022-08-25 ENCOUNTER — Ambulatory Visit: Payer: Medicare Other | Attending: Primary Care | Admitting: Physical Therapy

## 2022-08-25 DIAGNOSIS — M6281 Muscle weakness (generalized): Secondary | ICD-10-CM | POA: Diagnosis not present

## 2022-08-25 DIAGNOSIS — M5459 Other low back pain: Secondary | ICD-10-CM | POA: Diagnosis not present

## 2022-08-25 NOTE — Therapy (Signed)
OUTPATIENT PHYSICAL THERAPY TREATMENT    Patient Name: Denise Parker MRN: [id number] DOB:09-Oct-1950, 72 y.o., female Today's Date: 08/25/2022  PCP: Dr. Vernona Rieger  REFERRING PROVIDER: Dr. Vernona Rieger   END OF SESSION:   PT End of Session - 08/25/22 1223     Visit Number 6    Number of Visits 20    Date for PT Re-Evaluation 10/07/22    Authorization Type Medicare & Aetna    Authorization Time Period Medicare primary; A[employer] secondary    Authorization - Visit Number 6    Authorization - Number of Visits 20    Progress Note Due on Visit 10    PT Start Time 1220    PT Stop Time 1300    PT Time Calculation (min) 40 min    Activity Tolerance Patient tolerated treatment well;No increased pain    Behavior During Therapy [facility] for tasks assessed/performed              Past Medical History:  Diagnosis Date   Arthritis    Bronchitis 01/30/2021   Bursitis    back   Chronic gastritis without bleeding 05/30/2019   Cough in adult 01/30/2021   Herpes zoster 04/13/2013   HLD (hyperlipidemia)    HOH (hard of hearing)    Urinary frequency 11/21/2021   Past Surgical History:  Procedure Laterality Date   BREAST CYST ASPIRATION Left 90s   benign   CATARACT EXTRACTION W/PHACO Left 03/26/2017   Procedure: CATARACT EXTRACTION PHACO AND INTRAOCULAR LENS PLACEMENT (IOC);  Surgeon: Nevada Crane, MD;  Location: [facility] OR[facility];  Service: Ophthalmology;  Laterality: Left;   fluid pack lot #   [id number] [place]    00:23.1 AP%  7.5 CDE   1.71    CATARACT EXTRACTION W/PHACO Right 07/30/2017   Procedure: CATARACT EXTRACTION PHACO AND INTRAOCULAR LENS PLACEMENT (IOC);  Surgeon: Nevada Crane, MD;  Location: [facility];  Service: Ophthalmology;  Laterality: Right;  fluid pack lot #  [id number]  Exp  03/26/2018 [place]   00:32.1 AP%   5.9 CDE   1.81   COLONOSCOPY WITH PROPOFOL N/A 09/11/2017   Procedure: COLONOSCOPY WITH PROPOFOL;  Surgeon: Pasty Spillers, MD;  Location: [facility];   Service: Gastroenterology;  Laterality: N/A;   TONSILLECTOMY     TUBAL LIGATION     Patient Active Problem List   Diagnosis Date Noted   Acute pain of right knee 07/24/2022   Elevated blood pressure reading in office without diagnosis of hypertension 11/21/2021   Heavy alcohol use 05/30/2019   Restless legs 05/30/2019   Allergic reaction 06/29/2018   GERD (gastroesophageal reflux disease) 03/03/2017   Trochanteric bursitis, right hip 10/21/2016   Osteoporosis 09/09/2016   Chronic back pain 07/15/2016   Decreased hearing 07/15/2016   Myalgia 10/08/2015   Arthritis 06/20/2015   HLD (hyperlipidemia) 06/20/2015   Metatarsalgia 09/27/2012   Allergic rhinitis with postnasal drip 06/16/2011    REFERRING DIAG: M54.50,G89.29 (ICD-10-CM) - Chronic low back pain, unspecified back pain laterality, unspecified whether sciatica present   THERAPY DIAG:  Other low back pain  Muscle weakness (generalized)  Rationale for Evaluation and Treatment Rehabilitation  PERTINENT HISTORY: Pt reports having ongoing back pain since 2016 which she attributes to her degenerative disc disease. She has been doing physical therapy for the past several years off and on. Her most recent episode occurred after traveling where she felt that laying on different surfaces and sitting for prolonged times trigger symptoms. She know feels pain that  radiates down her right hip and into right groin. She reports also hurting her right knee when going up and down stairs at a vacation home that had stairs to get up to loft. She describes the right knee and hip hurting her the most. Her knee has hurt her to the point where she has decreased her activity with decreased walking and high intensity exercise. She prefers laying on her right side, but she has had to lay on her back to sleep because of right knee pain.   PRECAUTIONS: None   SUBJECTIVE:                                                                                                                                                                                       SUBJECTIVE STATEMENT: Pt has not been as physically active as she is used to because she had a conference over weekend. Last time she attempted walking, she felt increased pain and catching in right knee after 0.25 miles especially when going down hills.   PAIN:  Are you having pain? No   OBJECTIVE:   TODAY'S TREATMENT:                                                                                                                              DATE:    08/25/22: Nustep Seat 8, arms 8, level 3 x 5 minutes (self selected speed 180s SPM) Step up on 4 inch step using BUE support on TM bar  1 x 10 Step up on 4 inch step using 1 UE support on TM bar 1 x 10  Step up on 6 inch step using no BUE support on TM bar 1 x 10  Step up on 8 inch step using no BUE support on TM bar 1 x 10  Runner Step up on 8 inch step using no BUE support on TM 1 x 10  Runner Step up on 8 inch step using no BUE support and jug of water 1 x 10  Eccentric Step down 6 inch step using 1 UE support 1 x 5  -Pt  unable to eccentrically control descent  Eccentric Step down 6 inc step with 4 inch yoga block positioned below to shorten difference and 1 UE support 1 x 10  Hamstring Bridges 3 x 10  -mod VC to extend heels out to target hamstrings more   08/20/22: -Nustep Seat 8, arms 8, level 3 x 5 minutes (self selected speed 120s SPM)   -hip flexor lunge stretch with foot on 2nd step: 2x30 sec bilat (difficult to say if this is superior to the thomas test version, pt appears to have good psoas length and hip extension ROM, has to use more lumbar extension for stretch than goal)   -Standing Marches #3 1 x 15 bilat -Standing Marches with 8lbFW each hand 1 x 20 alternating  -Standing hip extension SLR from forward flexed posture (hands on treadbar) 1x20 bilat  -Standing marching in place 3lbAW bilat, 15lb axial medicine ball 1x20bilat    -Hooklying SAQ 1x15 c 3lb AW bilat thighs on blue foam roll -Hooklying RedTB 1x15, feet at 12" width  -Hooklying SAQ 1x15x3secH c 3lb AW bilat thighs on blue foam roll -Hooklying GreenTB 1x15, feet at 12" width   ---------------------------------------- 08/12/22: Nu-Step Seat and arms at 8 with resistance at 3 for 5 min  Standing Marches #3 1 x 10  Standing Marches with water jugs 2 x 10  Standing Hip Abduction with BUE support 1 x 10  Standing Hip Abduction with #3 AW and BUE support 2 x 10   08/05/22: Nu-Step Seat and arms at 8 with resistance at 3 for 5 min  Hip MMT:     IR R/L 4+/4+     ER R/L 4+/4+  ABDOMINAL MMT 4- Good - Pelvic tilt & lower an extended leg 30 Hooklying Toes taps 3 x 5  Thomas Stretch on End of Mat 2 x 30 sec  Side Lying Prone Stretch 2 x 30 sec  -Pt reports decreased sensation compared to prone quad stretch  Mini-Squat 3 x 10  -min VC to maintain upright posture and keep knees behind toes   07/29/22: Prone Quad Stretch 2 x 30 sec  Modified Thomas Test 2 x 30 sec   Supine Bridges 1 x 10    ABDOMINAL MMT  5 Normal Pelvic tilt & lower legs 45-30-15-0 4+ Good + Pelvic tilt & lower an extended leg 04 Good Pelvic tilt & lower an extended leg 15 4- Good - Pelvic tilt & lower an extended leg 30 3+ Fair + Pelvic tilt & extend leg x 10 3 Fair Pelvic tilt & extend leg x 5 3- Fair - Pelvic tilt & extend leg x 1 2+ Poor + Pelvic tilt x 10 2 Poor Pelvic tilt x 5 2- Poor - Pelvic tilt x 1 1 Trace Unable to perform pelvic tilt 0 Zero No contraction     PATIENT EDUCATION:  Education details: form and technique for correct form with exercise  Person educated: Patient Education method: Programmer, multimedia, Demonstration, Verbal cues, and Handouts Education comprehension: verbalized understanding, returned demonstration, verbal cues required, and needs further education   HOME EXERCISE PROGRAM: Access Code: [email or web address] URL:  [email or web address] Date: 08/25/2022 Prepared by: Ellin Goodie  Exercises - Prone Quadriceps Stretch with Strap  - 1 x daily - 3 reps - 60 sec hold - Thomas Stretch on Table  - 1 x daily - 3 reps - 30-60 sec  hold - Hooklying Sequential Leg March and Lower  - 3-4 x weekly - 3 sets - 5-10 reps -  Mini Squat  - 3-4 x weekly - 3 sets - 10 reps - Standing Hip Abduction with Counter Support  - 3-4 x weekly - 3 sets - 10 reps - Forward Step Down  - 3-4 x weekly - 3 sets - 10 reps -Hamstring Bridge 3-4x weekly 3 sets - 10 reps   ASSESSMENT:   CLINICAL IMPRESSION: Pt shows improvement with right knee strength with ability to perform eccentric step down with no increase in right knee pain. HEP modified to include progressed knee strengthening exercise to continue ongoing right knee strengthening. She will continue to benefit from skilled PT to address these aforementioned deficits to return to her PLOF and to improve the quality of her life.   OBJECTIVE IMPAIRMENTS: difficulty walking, decreased ROM, decreased strength, impaired flexibility, postural dysfunction, and pain.    ACTIVITY LIMITATIONS: carrying, lifting, bending, standing, squatting, and stairs   PARTICIPATION LIMITATIONS: driving and community activity   PERSONAL FACTORS: Age, Past/current experiences, Time since onset of injury/illness/exacerbation.  are also affecting patient's functional outcome.    REHAB POTENTIAL: Good   CLINICAL DECISION MAKING: Stable/uncomplicated   EVALUATION COMPLEXITY: Low     GOALS: Goals reviewed with patient? No   SHORT TERM GOALS: Target date: 08/12/2022   Pt will be independent with HEP in order to improve strength and balance in order to decrease fall risk and improve function at home and work. Baseline: 08/05/22  Goal status: Achieved     LONG TERM GOALS: Target date: 10/07/2022   Patient will have improved function and activity level as evidenced by an increase in FOTO  score by 10 points or more.  Baseline: 56/100 with target of 70 Goal status: Ongoing    2.  Patient will improve hip strength by 1/3 grade MMT (ie 4- to 4) for improved lumbar stability and reduction in symptoms and return to PLOF.  Baseline: Hip Flex R/L 4-/4-, Hip Ext R/L 4-/4-, Hip Abd R/L 4-/4-, Hip ER R/L4/4, Hip IR R/L 4/4  Goal status: Ongoing    3.  Patient will improve abdominal strength by 1/3 grade MMT (ie 4- to 4) for improve lumbar stability and reduction in symptoms and return to PLOF.  Baseline: Grade 4- on Sahramann  Goal status: Ongoing    PLAN:   PT FREQUENCY: 1-2x/week   PT DURATION: 10 weeks   PLANNED INTERVENTIONS: Therapeutic exercises, Therapeutic activity, Neuromuscular re-education, Balance training, Gait training, Patient/Family education, Joint mobilization, Joint manipulation, Aquatic Therapy, Dry Needling, Electrical stimulation, Spinal manipulation, Spinal mobilization, Cryotherapy, Moist heat, Manual therapy, and Re-evaluation.   PLAN FOR NEXT SESSION:  Step downs without UE support. See how pt reacts to Omega knee ext and hs curl. Progress hip and abdominal strengthening: weight mini-squats, paloff press, and progress abdominal HEP exercise.    Ellin Goodie PT, DPT  12:24 PM, 08/25/22

## 2022-09-01 ENCOUNTER — Encounter: Payer: 59 | Admitting: Physical Therapy

## 2022-09-02 ENCOUNTER — Ambulatory Visit: Payer: Medicare Other | Admitting: Physical Therapy

## 2022-09-02 ENCOUNTER — Encounter: Payer: Self-pay | Admitting: Physical Therapy

## 2022-09-02 DIAGNOSIS — M5459 Other low back pain: Secondary | ICD-10-CM | POA: Diagnosis not present

## 2022-09-02 DIAGNOSIS — M6281 Muscle weakness (generalized): Secondary | ICD-10-CM | POA: Diagnosis not present

## 2022-09-02 NOTE — Therapy (Signed)
OUTPATIENT PHYSICAL THERAPY TREATMENT    Patient Name: Denise Parker MRN: [id number] DOB:09/05/1950, 72 y.o., female Today's Date: 09/02/2022  PCP: Dr. Vernona Rieger  REFERRING PROVIDER: Dr. Vernona Rieger   END OF SESSION:   PT End of Session - 09/02/22 1438     Visit Number 7    Number of Visits 20    Date for PT Re-Evaluation 10/07/22    Authorization Type Medicare & Aetna    Authorization Time Period Medicare primary; AARP secondary    Authorization - Visit Number 7    Authorization - Number of Visits 20    Progress Note Due on Visit 10    PT Start Time 1435    PT Stop Time 1515    PT Time Calculation (min) 40 min    Activity Tolerance Patient tolerated treatment well;No increased pain    Behavior During Therapy [facility] for tasks assessed/performed               Past Medical History:  Diagnosis Date   Arthritis    Bronchitis 01/30/2021   Bursitis    back   Chronic gastritis without bleeding 05/30/2019   Cough in adult 01/30/2021   Herpes zoster 04/13/2013   HLD (hyperlipidemia)    HOH (hard of hearing)    Urinary frequency 11/21/2021   Past Surgical History:  Procedure Laterality Date   BREAST CYST ASPIRATION Left 90s   benign   CATARACT EXTRACTION W/PHACO Left 03/26/2017   Procedure: CATARACT EXTRACTION PHACO AND INTRAOCULAR LENS PLACEMENT (IOC);  Surgeon: Nevada Crane, MD;  Location: [facility];  Service: Ophthalmology;  Laterality: Left;   fluid pack lot #   [id number] H [place]    00:23.1 AP%  7.5 CDE   1.71    CATARACT EXTRACTION W/PHACO Right 07/30/2017   Procedure: CATARACT EXTRACTION PHACO AND INTRAOCULAR LENS PLACEMENT (IOC);  Surgeon: Nevada Crane, MD;  Location: [facility];  Service: Ophthalmology;  Laterality: Right;  fluid pack lot #  [id number] H  Exp  03/26/2018 [place]   00:32.1 AP%   5.9 CDE   1.81   COLONOSCOPY WITH PROPOFOL N/A 09/11/2017   Procedure: COLONOSCOPY WITH PROPOFOL;  Surgeon: Pasty Spillers, MD;  Location: [facility];   Service: Gastroenterology;  Laterality: N/A;   TONSILLECTOMY     TUBAL LIGATION     Patient Active Problem List   Diagnosis Date Noted   Acute pain of right knee 07/24/2022   Elevated blood pressure reading in office without diagnosis of hypertension 11/21/2021   Heavy alcohol use 05/30/2019   Restless legs 05/30/2019   Allergic reaction 06/29/2018   GERD (gastroesophageal reflux disease) 03/03/2017   Trochanteric bursitis, right hip 10/21/2016   Osteoporosis 09/09/2016   Chronic back pain 07/15/2016   Decreased hearing 07/15/2016   Myalgia 10/08/2015   Arthritis 06/20/2015   HLD (hyperlipidemia) 06/20/2015   Metatarsalgia 09/27/2012   Allergic rhinitis with postnasal drip 06/16/2011    REFERRING DIAG: M54.50,G89.29 (ICD-10-CM) - Chronic low back pain, unspecified back pain laterality, unspecified whether sciatica present   THERAPY DIAG:  Other low back pain  Muscle weakness (generalized)  Rationale for Evaluation and Treatment Rehabilitation  PERTINENT HISTORY: Pt reports having ongoing back pain since 2016 which she attributes to her degenerative disc disease. She has been doing physical therapy for the past several years off and on. Her most recent episode occurred after traveling where she felt that laying on different surfaces and sitting for prolonged times trigger symptoms. She know feels pain  that radiates down her right hip and into right groin. She reports also hurting her right knee when going up and down stairs at a vacation home that had stairs to get up to loft. She describes the right knee and hip hurting her the most. Her knee has hurt her to the point where she has decreased her activity with decreased walking and high intensity exercise. She prefers laying on her right side, but she has had to lay on her back to sleep because of right knee pain.   PRECAUTIONS: None   SUBJECTIVE:                                                                                                                                                                                       SUBJECTIVE STATEMENT: Pt reports that she thinks that the hamstring bridges caused her more low back pain. Her right knee pain has not worsened since last session. Pt states that she would like to be able to carry food upstairs instead of placing platter one step up above her before taking step.    PAIN:  Are you having pain? No   OBJECTIVE:   TODAY'S TREATMENT:                                                                                                                              DATE:    09/02/22:  TM 2.5 mph with BUE support for 5 min  Pallof press on OMEGA #10 3 x 10  -min VC to increased elbow extension  Standing March with Alternating Shoulder Press #2 DB 3 x 10  -Pt positioned against wall to avoid imbalance  Sled Push with #130 lbs including weight of sled forward x 4  Sled Push with #130 lbs including weight of sled backward x 4 Sled Push with #130 lbs including weight of sled marches x 2  Mini-Squat with water jug to 18 inch surface 1 x 10  -Pt unable to maintain upright posture  Step Up on 6 inch step with water jug 1 x 10  Step Up on 8 inch  step with water jug 2 x 10  -min VC to step up with right and down with left   08/25/22: Nustep Seat 8, arms 8, level 3 x 5 minutes (self selected speed 180s SPM) Step up on 4 inch step using BUE support on TM bar  1 x 10 Step up on 4 inch step using 1 UE support on TM bar 1 x 10  Step up on 6 inch step using no BUE support on TM bar 1 x 10  Step up on 8 inch step using no BUE support on TM bar 1 x 10  Runner Step up on 8 inch step using no BUE support on TM 1 x 10  Runner Step up on 8 inch step using no BUE support and jug of water 1 x 10  Eccentric Step down 6 inch step using 1 UE support 1 x 5  -Pt unable to eccentrically control descent  Eccentric Step down 6 inc step with 4 inch yoga block positioned below to shorten difference and  1 UE support 1 x 10  Hamstring Bridges 3 x 10  -mod VC to extend heels out to target hamstrings more   08/20/22: -Nustep Seat 8, arms 8, level 3 x 5 minutes (self selected speed 120s SPM)   -hip flexor lunge stretch with foot on 2nd step: 2x30 sec bilat (difficult to say if this is superior to the thomas test version, pt appears to have good psoas length and hip extension ROM, has to use more lumbar extension for stretch than goal)   -Standing Marches #3 1 x 15 bilat -Standing Marches with 8lbFW each hand 1 x 20 alternating  -Standing hip extension SLR from forward flexed posture (hands on treadbar) 1x20 bilat  -Standing marching in place 3lbAW bilat, 15lb axial medicine ball 1x20bilat   -Hooklying SAQ 1x15 c 3lb AW bilat thighs on blue foam roll -Hooklying RedTB 1x15, feet at 12" width  -Hooklying SAQ 1x15x3secH c 3lb AW bilat thighs on blue foam roll -Hooklying GreenTB 1x15, feet at 12" width   ---------------------------------------- 08/12/22: Nu-Step Seat and arms at 8 with resistance at 3 for 5 min  Standing Marches #3 1 x 10  Standing Marches with water jugs 2 x 10  Standing Hip Abduction with BUE support 1 x 10  Standing Hip Abduction with #3 AW and BUE support 2 x 10   08/05/22: Nu-Step Seat and arms at 8 with resistance at 3 for 5 min  Hip MMT:     IR R/L 4+/4+     ER R/L 4+/4+  ABDOMINAL MMT 4- Good - Pelvic tilt & lower an extended leg 30 Hooklying Toes taps 3 x 5  Thomas Stretch on End of Mat 2 x 30 sec  Side Lying Prone Stretch 2 x 30 sec  -Pt reports decreased sensation compared to prone quad stretch  Mini-Squat 3 x 10  -min VC to maintain upright posture and keep knees behind toes   07/29/22: Prone Quad Stretch 2 x 30 sec  Modified Thomas Test 2 x 30 sec   Supine Bridges 1 x 10    ABDOMINAL MMT  5 Normal Pelvic tilt & lower legs 45-30-15-0 4+ Good + Pelvic tilt & lower an extended leg 04 Good Pelvic tilt & lower an extended leg 15 4- Good - Pelvic  tilt & lower an extended leg 30 3+ Fair + Pelvic tilt & extend leg x 10 3 Fair Pelvic tilt & extend leg x 5 3-  Fair - Pelvic tilt & extend leg x 1 2+ Poor + Pelvic tilt x 10 2 Poor Pelvic tilt x 5 2- Poor - Pelvic tilt x 1 1 Trace Unable to perform pelvic tilt 0 Zero No contraction     PATIENT EDUCATION:  Education details: form and technique for correct form with exercise  Person educated: Patient Education method: Explanation, Demonstration, Verbal cues, and Handouts Education comprehension: verbalized understanding, returned demonstration, verbal cues required, and needs further education   HOME EXERCISE PROGRAM: Access Code: [email or web address] URL: [email or web address] Date: 09/02/2022 Prepared by: Ellin Goodie  Exercises - Prone Quadriceps Stretch with Strap  - 1 x daily - 3 reps - 60 sec hold - Thomas Stretch on Table  - 1 x daily - 3 reps - 30-60 sec  hold - Standing March with Alternating Shoulder Press   - 3-4 x weekly - 3 sets - 10 reps - Standing Hip Abduction with Counter Support  - 3-4 x weekly - 3 sets - 10 reps - Forward Step Down  - 3-4 x weekly - 3 sets - 10 reps - Step Up  - 3-4 x weekly - 3 sets - 10 reps  ASSESSMENT:   CLINICAL IMPRESSION: Pt continues to demonstrate improvement with LE strength with ability to perform sled push with resistance and step ups with resistance without a provocation in her pain. HEP modified to exclude lumbar extension based exercises like bridges with HS bias. She will continue to benefit from skilled PT to address these aforementioned deficits to return to her PLOF and to improve the quality of her life.   OBJECTIVE IMPAIRMENTS: difficulty walking, decreased ROM, decreased strength, impaired flexibility, postural dysfunction, and pain.    ACTIVITY LIMITATIONS: carrying, lifting, bending, standing, squatting, and stairs   PARTICIPATION LIMITATIONS: driving and community activity   PERSONAL FACTORS: Age, Past/current  experiences, Time since onset of injury/illness/exacerbation.  are also affecting patient's functional outcome.    REHAB POTENTIAL: Good   CLINICAL DECISION MAKING: Stable/uncomplicated   EVALUATION COMPLEXITY: Low     GOALS: Goals reviewed with patient? No   SHORT TERM GOALS: Target date: 08/12/2022   Pt will be independent with HEP in order to improve strength and balance in order to decrease fall risk and improve function at home and work. Baseline: 08/05/22  Goal status: Achieved     LONG TERM GOALS: Target date: 10/07/2022   Patient will have improved function and activity level as evidenced by an increase in FOTO score by 10 points or more.  Baseline: 56/100 with target of 70 Goal status: Ongoing    2.  Patient will improve hip strength by 1/3 grade MMT (ie 4- to 4) for improved lumbar stability and reduction in symptoms and return to PLOF.  Baseline: Hip Flex R/L 4-/4-, Hip Ext R/L 4-/4-, Hip Abd R/L 4-/4-, Hip ER R/L4/4, Hip IR R/L 4/4  Goal status: Ongoing    3.  Patient will improve abdominal strength by 1/3 grade MMT (ie 4- to 4) for improve lumbar stability and reduction in symptoms and return to PLOF.  Baseline: Grade 4- on Sahramann  Goal status: Ongoing    PLAN:   PT FREQUENCY: 1-2x/week   PT DURATION: 10 weeks   PLANNED INTERVENTIONS: Therapeutic exercises, Therapeutic activity, Neuromuscular re-education, Balance training, Gait training, Patient/Family education, Joint mobilization, Joint manipulation, Aquatic Therapy, Dry Needling, Electrical stimulation, Spinal manipulation, Spinal mobilization, Cryotherapy, Moist heat, Manual therapy, and Re-evaluation.   PLAN FOR NEXT SESSION:  Step downs without UE  support. Increase resistance with standing hip abduction and continue with sled push. Maybe try OMEGA leg press and hamstring curl.  Ellin Goodie PT, DPT  2:38 PM, 09/02/22

## 2022-09-04 ENCOUNTER — Encounter: Payer: Self-pay | Admitting: Physical Therapy

## 2022-09-04 ENCOUNTER — Ambulatory Visit: Payer: Medicare Other | Admitting: Physical Therapy

## 2022-09-04 DIAGNOSIS — M5459 Other low back pain: Secondary | ICD-10-CM | POA: Diagnosis not present

## 2022-09-04 DIAGNOSIS — M6281 Muscle weakness (generalized): Secondary | ICD-10-CM | POA: Diagnosis not present

## 2022-09-04 NOTE — Therapy (Signed)
OUTPATIENT PHYSICAL THERAPY TREATMENT    Patient Name: Denise Parker MRN: [id number] DOB:09/29/1950, 72 y.o., female Today's Date: 09/04/2022  PCP: Dr. Vernona Rieger  REFERRING PROVIDER: Dr. Vernona Rieger   END OF SESSION:   PT End of Session - 09/04/22 1123     Visit Number 8    Number of Visits 20    Date for PT Re-Evaluation 10/07/22    Authorization Type Medicare & [employer]    Authorization Time Period Medicare primary; A[employer] secondary    Authorization - Visit Number 8    Authorization - Number of Visits 20    Progress Note Due on Visit 10    PT Start Time 1120    PT Stop Time 1200    PT Time Calculation (min) 40 min    Activity Tolerance Patient tolerated treatment well;No increased pain    Behavior During Therapy [facility] for tasks assessed/performed               Past Medical History:  Diagnosis Date   Arthritis    Bronchitis 01/30/2021   Bursitis    back   Chronic gastritis without bleeding 05/30/2019   Cough in adult 01/30/2021   Herpes zoster 04/13/2013   HLD (hyperlipidemia)    HOH (hard of hearing)    Urinary frequency 11/21/2021   Past Surgical History:  Procedure Laterality Date   BREAST CYST ASPIRATION Left 90s   benign   CATARACT EXTRACTION W/PHACO Left 03/26/2017   Procedure: CATARACT EXTRACTION PHACO AND INTRAOCULAR LENS PLACEMENT (IOC);  Surgeon: Nevada Crane, MD;  Location: [facility] OR[facility];  Service: Ophthalmology;  Laterality: Left;   fluid pack lot #   [id number] H [place]    00:23.1 AP%  7.5 CDE   1.71    CATARACT EXTRACTION W/PHACO Right 07/30/2017   Procedure: CATARACT EXTRACTION PHACO AND INTRAOCULAR LENS PLACEMENT (IOC);  Surgeon: Nevada Crane, MD;  Location: [facility];  Service: Ophthalmology;  Laterality: Right;  fluid pack lot #  [id number]  Exp  03/26/2018 [place]   00:32.1 AP%   5.9 CDE   1.81   COLONOSCOPY WITH PROPOFOL N/A 09/11/2017   Procedure: COLONOSCOPY WITH PROPOFOL;  Surgeon: Pasty Spillers, MD;  Location: [facility];   Service: Gastroenterology;  Laterality: N/A;   TONSILLECTOMY     TUBAL LIGATION     Patient Active Problem List   Diagnosis Date Noted   Acute pain of right knee 07/24/2022   Elevated blood pressure reading in office without diagnosis of hypertension 11/21/2021   Heavy alcohol use 05/30/2019   Restless legs 05/30/2019   Allergic reaction 06/29/2018   GERD (gastroesophageal reflux disease) 03/03/2017   Trochanteric bursitis, right hip 10/21/2016   Osteoporosis 09/09/2016   Chronic back pain 07/15/2016   Decreased hearing 07/15/2016   Myalgia 10/08/2015   Arthritis 06/20/2015   HLD (hyperlipidemia) 06/20/2015   Metatarsalgia 09/27/2012   Allergic rhinitis with postnasal drip 06/16/2011    REFERRING DIAG: M54.50,G89.29 (ICD-10-CM) - Chronic low back pain, unspecified back pain laterality, unspecified whether sciatica present   THERAPY DIAG:  Other low back pain  Muscle weakness (generalized)  Rationale for Evaluation and Treatment Rehabilitation  PERTINENT HISTORY: Pt reports having ongoing back pain since 2016 which she attributes to her degenerative disc disease. She has been doing physical therapy for the past several years off and on. Her most recent episode occurred after traveling where she felt that laying on different surfaces and sitting for prolonged times trigger symptoms. She know feels pain  that radiates down her right hip and into right groin. She reports also hurting her right knee when going up and down stairs at a vacation home that had stairs to get up to loft. She describes the right knee and hip hurting her the most. Her knee has hurt her to the point where she has decreased her activity with decreased walking and high intensity exercise. She prefers laying on her right side, but she has had to lay on her back to sleep because of right knee pain.   PRECAUTIONS: None   SUBJECTIVE:                                                                                                                                                                                       SUBJECTIVE STATEMENT: Pt states that she continues to feel better and that she only felt minor discomfort in right knee when descending hill.   PAIN:  Are you having pain? No   OBJECTIVE:   TODAY'S TREATMENT:                                                                                                                              DATE:    09/04/22: Nu-Step with seat and arms set 8 with resistance at 3 for 5 min  OMEGA Leg Press #55 1 x 15  OMEGA Leg Press #65 1 x 15 OMEGA Leg Press #85 1 x 15  -Pt feels most fatigue when performing this weight at 15 reps  FOTO: 61/100 with target of 70 OMEGA Knee Ext #10 3 x 15  OMEGA HS #20 3 x 15   MANUAL  Supine HS Stretch 4 x 30 sec  Single Knee to Chest 4 x 30 sec    09/02/22:  TM 2.5 mph with BUE support for 5 min  Pallof press on OMEGA #10 3 x 10  -min VC to increased elbow extension  Standing March with Alternating Shoulder Press #2 DB 3 x 10  -Pt positioned against wall to avoid imbalance  Sled Push with #130 lbs including weight of sled forward x  4  Sled Push with #130 lbs including weight of sled backward x 4 Sled Push with #130 lbs including weight of sled marches x 2  Mini-Squat with water jug to 18 inch surface 1 x 10  -Pt unable to maintain upright posture  Step Up on 6 inch step with water jug 1 x 10  Step Up on 8 inch step with water jug 2 x 10  -min VC to step up with right and down with left   08/25/22: Nustep Seat 8, arms 8, level 3 x 5 minutes (self selected speed 180s SPM) Step up on 4 inch step using BUE support on TM bar  1 x 10 Step up on 4 inch step using 1 UE support on TM bar 1 x 10  Step up on 6 inch step using no BUE support on TM bar 1 x 10  Step up on 8 inch step using no BUE support on TM bar 1 x 10  Runner Step up on 8 inch step using no BUE support on TM 1 x 10  Runner Step up on 8 inch step using no BUE  support and jug of water 1 x 10  Eccentric Step down 6 inch step using 1 UE support 1 x 5  -Pt unable to eccentrically control descent  Eccentric Step down 6 inc step with 4 inch yoga block positioned below to shorten difference and 1 UE support 1 x 10  Hamstring Bridges 3 x 10  -mod VC to extend heels out to target hamstrings more   08/20/22: -Nustep Seat 8, arms 8, level 3 x 5 minutes (self selected speed 120s SPM)   -hip flexor lunge stretch with foot on 2nd step: 2x30 sec bilat (difficult to say if this is superior to the thomas test version, pt appears to have good psoas length and hip extension ROM, has to use more lumbar extension for stretch than goal)   -Standing Marches #3 1 x 15 bilat -Standing Marches with 8lbFW each hand 1 x 20 alternating  -Standing hip extension SLR from forward flexed posture (hands on treadbar) 1x20 bilat  -Standing marching in place 3lbAW bilat, 15lb axial medicine ball 1x20bilat   -Hooklying SAQ 1x15 c 3lb AW bilat thighs on blue foam roll -Hooklying RedTB 1x15, feet at 12" width  -Hooklying SAQ 1x15x3secH c 3lb AW bilat thighs on blue foam roll -Hooklying GreenTB 1x15, feet at 12" width   ---------------------------------------- 08/12/22: Nu-Step Seat and arms at 8 with resistance at 3 for 5 min  Standing Marches #3 1 x 10  Standing Marches with water jugs 2 x 10  Standing Hip Abduction with BUE support 1 x 10  Standing Hip Abduction with #3 AW and BUE support 2 x 10   PATIENT EDUCATION:  Education details: form and technique for correct form with exercise  Person educated: Patient Education method: Programmer, multimedia, Demonstration, Verbal cues, and Handouts Education comprehension: verbalized understanding, returned demonstration, verbal cues required, and needs further education   HOME EXERCISE PROGRAM: Access Code: [email or web address] URL: [email or web address] Date: 09/04/2022 Prepared by: Ellin Goodie  Exercises - Prone Quadriceps  Stretch with Strap  - 1 x daily - 3 reps - 60 sec hold - Thomas Stretch on Table  - 1 x daily - 3 reps - 30-60 sec  hold - Single Knee to Chest Stretch  - 1 x daily - 3 reps - 30-60 sec  hold - Standing March with Alternating Shoulder Press   - 3-4 x weekly -  3 sets - 10 reps - Standing Hip Abduction with Counter Support  - 3-4 x weekly - 3 sets - 10 reps - Forward Step Down  - 3-4 x weekly - 3 sets - 10 reps - Step Up  - 3-4 x weekly - 3 sets - 10 reps  ASSESSMENT:   CLINICAL IMPRESSION: Pt continues to show improvement with low back function and strength as evidenced by increased FOTO score and performance of leg press, knee ext and knee flexion machines without an increase in pain. Focus strength training shifted to endurance with performance of increased repetitions per set. She will continue to benefit from skilled PT to address these aforementioned deficits to return to her PLOF and to improve the quality of her life.   OBJECTIVE IMPAIRMENTS: difficulty walking, decreased ROM, decreased strength, impaired flexibility, postural dysfunction, and pain.    ACTIVITY LIMITATIONS: carrying, lifting, bending, standing, squatting, and stairs   PARTICIPATION LIMITATIONS: driving and community activity   PERSONAL FACTORS: Age, Past/current experiences, Time since onset of injury/illness/exacerbation.  are also affecting patient's functional outcome.    REHAB POTENTIAL: Good   CLINICAL DECISION MAKING: Stable/uncomplicated   EVALUATION COMPLEXITY: Low     GOALS: Goals reviewed with patient? No   SHORT TERM GOALS: Target date: 08/12/2022   Pt will be independent with HEP in order to improve strength and balance in order to decrease fall risk and improve function at home and work. Baseline: 08/05/22  Goal status: Achieved     LONG TERM GOALS: Target date: 10/07/2022   Patient will have improved function and activity level as evidenced by an increase in FOTO score by 10 points or more.   Baseline: 56/100 with target of 70 Goal status: Ongoing    2.  Patient will improve hip strength by 1/3 grade MMT (ie 4- to 4) for improved lumbar stability and reduction in symptoms and return to PLOF.  Baseline: Hip Flex R/L 4-/4-, Hip Ext R/L 4-/4-, Hip Abd R/L 4-/4-, Hip ER R/L4/4, Hip IR R/L 4/4  Goal status: Ongoing    3.  Patient will improve abdominal strength by 1/3 grade MMT (ie 4- to 4) for improve lumbar stability and reduction in symptoms and return to PLOF.  Baseline: Grade 4- on Sahramann  Goal status: Ongoing    PLAN:   PT FREQUENCY: 1-2x/week   PT DURATION: 10 weeks   PLANNED INTERVENTIONS: Therapeutic exercises, Therapeutic activity, Neuromuscular re-education, Balance training, Gait training, Patient/Family education, Joint mobilization, Joint manipulation, Aquatic Therapy, Dry Needling, Electrical stimulation, Spinal manipulation, Spinal mobilization, Cryotherapy, Moist heat, Manual therapy, and Re-evaluation.   PLAN FOR NEXT SESSION:  Step downs without UE support. Sled push. Progress hip abduction strengthening exercises   Ellin Goodie PT, DPT  11:24 AM, 09/04/22

## 2022-09-08 ENCOUNTER — Ambulatory Visit: Payer: Medicare Other | Admitting: Physical Therapy

## 2022-09-08 DIAGNOSIS — M5459 Other low back pain: Secondary | ICD-10-CM | POA: Diagnosis not present

## 2022-09-08 DIAGNOSIS — M6281 Muscle weakness (generalized): Secondary | ICD-10-CM

## 2022-09-08 NOTE — Therapy (Signed)
OUTPATIENT PHYSICAL THERAPY TREATMENT    Patient Name: Denise Parker MRN: [id number] DOB:[birth year], 72 y.o., female Today's Date: 09/08/2022  PCP: Dr. Vernona Rieger  REFERRING PROVIDER: Dr. Vernona Rieger   END OF SESSION:   PT End of Session - 09/08/22 1349     Visit Number 9    Number of Visits 20    Date for PT Re-Evaluation 10/07/22    Authorization Type Medicare & Aetna    Authorization Time Period Medicare primary; AARP secondary    Authorization - Visit Number 9    Authorization - Number of Visits 20    Progress Note Due on Visit 10    PT Start Time 1350    PT Stop Time 1430    PT Time Calculation (min) 40 min    Activity Tolerance Patient tolerated treatment well;No increased pain    Behavior During Therapy [facility] for tasks assessed/performed               Past Medical History:  Diagnosis Date   Arthritis    Bronchitis 01/30/2021   Bursitis    back   Chronic gastritis without bleeding 05/30/2019   Cough in adult 01/30/2021   Herpes zoster 04/13/2013   HLD (hyperlipidemia)    HOH (hard of hearing)    Urinary frequency 11/21/2021   Past Surgical History:  Procedure Laterality Date   BREAST CYST ASPIRATION Left 90s   benign   CATARACT EXTRACTION W/PHACO Left 03/26/2017   Procedure: CATARACT EXTRACTION PHACO AND INTRAOCULAR LENS PLACEMENT (IOC);  Surgeon: Nevada Crane, MD;  Location: [facility];  Service: Ophthalmology;  Laterality: Left;   fluid pack lot #   [id number] H [place]    00:23.1 AP%  7.5 CDE   1.71    CATARACT EXTRACTION W/PHACO Right 07/30/2017   Procedure: CATARACT EXTRACTION PHACO AND INTRAOCULAR LENS PLACEMENT (IOC);  Surgeon: Nevada Crane, MD;  Location: [facility];  Service: Ophthalmology;  Laterality: Right;  fluid pack lot #  [id number] H  Exp  03/26/2018 [place]   00:32.1 AP%   5.9 CDE   1.81   COLONOSCOPY WITH PROPOFOL N/A 09/11/2017   Procedure: COLONOSCOPY WITH PROPOFOL;  Surgeon: Pasty Spillers, MD;  Location: [facility];   Service: Gastroenterology;  Laterality: N/A;   TONSILLECTOMY     TUBAL LIGATION     Patient Active Problem List   Diagnosis Date Noted   Acute pain of right knee 07/24/2022   Elevated blood pressure reading in office without diagnosis of hypertension 11/21/2021   Heavy alcohol use 05/30/2019   Restless legs 05/30/2019   Allergic reaction 06/29/2018   GERD (gastroesophageal reflux disease) 03/03/2017   Trochanteric bursitis, right hip 10/21/2016   Osteoporosis 09/09/2016   Chronic back pain 07/15/2016   Decreased hearing 07/15/2016   Myalgia 10/08/2015   Arthritis 06/20/2015   HLD (hyperlipidemia) 06/20/2015   Metatarsalgia 09/27/2012   Allergic rhinitis with postnasal drip 06/16/2011    REFERRING DIAG: M54.50,G89.29 (ICD-10-CM) - Chronic low back pain, unspecified back pain laterality, unspecified whether sciatica present   THERAPY DIAG:  Other low back pain  Muscle weakness (generalized)  Rationale for Evaluation and Treatment Rehabilitation  PERTINENT HISTORY: Pt reports having ongoing back pain since 2016 which she attributes to her degenerative disc disease. She has been doing physical therapy for the past several years off and on. Her most recent episode occurred after traveling where she felt that laying on different surfaces and sitting for prolonged times trigger symptoms. She know feels pain  that radiates down her right hip and into right groin. She reports also hurting her right knee when going up and down stairs at a vacation home that had stairs to get up to loft. She describes the right knee and hip hurting her the most. Her knee has hurt her to the point where she has decreased her activity with decreased walking and high intensity exercise. She prefers laying on her right side, but she has had to lay on her back to sleep because of right knee pain.   PRECAUTIONS: None   SUBJECTIVE:                                                                                                                                                                                       SUBJECTIVE STATEMENT: Pt reports that her right knee pain continues to decrease and she has returned to ambulating 3 miles.   PAIN:  Are you having pain? No   OBJECTIVE:   TODAY'S TREATMENT:                                                                                                                              DATE:    09/08/22:  TM 2.0 mph for 5 min  Sahrmann Level: 5  Standing Eccentric Step Down from 6 inch step onto 2 inch yoga block with BUE support 1 x 10  Standing Eccentric Step Down from 6 inch step onto 1 inch piece of wood with BUE support 1 x 10  -Pt reports that this depth is more similar to her setup at home  Standing Side Bend with #8 lb water jug 2 x 10  -min VC to avoid shoulder elevation Pallof Press #5 3 x 10   09/04/22: Nu-Step with seat and arms set 8 with resistance at 3 for 5 min  OMEGA Leg Press #55 1 x 15  OMEGA Leg Press #65 1 x 15 OMEGA Leg Press #85 1 x 15  -Pt feels most fatigue when performing this weight at 15 reps  FOTO: 61/100 with target of 70 OMEGA Knee Ext #10 3  x 15  OMEGA HS #20 3 x 15   MANUAL  Supine HS Stretch 4 x 30 sec  Single Knee to Chest 4 x 30 sec    09/02/22:  TM 2.5 mph with BUE support for 5 min  Pallof press on OMEGA #10 3 x 10  -min VC to increased elbow extension  Standing March with Alternating Shoulder Press #2 DB 3 x 10  -Pt positioned against wall to avoid imbalance  Sled Push with #130 lbs including weight of sled forward x 4  Sled Push with #130 lbs including weight of sled backward x 4 Sled Push with #130 lbs including weight of sled marches x 2  Mini-Squat with water jug to 18 inch surface 1 x 10  -Pt unable to maintain upright posture  Step Up on 6 inch step with water jug 1 x 10  Step Up on 8 inch step with water jug 2 x 10  -min VC to step up with right and down with left    PATIENT EDUCATION:  Education  details: form and technique for correct form with exercise  Person educated: Patient Education method: Programmer, multimedia, Demonstration, Verbal cues, and Handouts Education comprehension: verbalized understanding, returned demonstration, verbal cues required, and needs further education   HOME EXERCISE PROGRAM: Access Code: [email or web address] URL: [email or web address] Date: 09/08/2022 Prepared by: Ellin Goodie  Exercises - Prone Quadriceps Stretch with Strap  - 1 x daily - 3 reps - 60 sec hold - Thomas Stretch on Table  - 1 x daily - 3 reps - 30-60 sec  hold - Standing March with Alternating Shoulder Press   - 3-4 x weekly - 3 sets - 10 reps - Standing Hip Abduction with Counter Support  - 3-4 x weekly - 3 sets - 10 reps - Forward Step Down  - 3-4 x weekly - 3 sets - 10 reps - Standing Sidebends  - 3-4 x weekly - 3 sets - 10 reps  ASSESSMENT:   CLINICAL IMPRESSION: Pt demonstrates improvement in abdominal strength with increased Sahrmann score. Focused on anti-rotation core strengthening exercises given pt complaint of increased low back pain when rotating and twisting back. She did not experience an increase in her low back pain with exercises and she is close to end of POC. She will continue to benefit from skilled PT to address these aforementioned deficits to return to her PLOF and to improve the quality of her life.   OBJECTIVE IMPAIRMENTS: difficulty walking, decreased ROM, decreased strength, impaired flexibility, postural dysfunction, and pain.    ACTIVITY LIMITATIONS: carrying, lifting, bending, standing, squatting, and stairs   PARTICIPATION LIMITATIONS: driving and community activity   PERSONAL FACTORS: Age, Past/current experiences, Time since onset of injury/illness/exacerbation.  are also affecting patient's functional outcome.    REHAB POTENTIAL: Good   CLINICAL DECISION MAKING: Stable/uncomplicated   EVALUATION COMPLEXITY: Low     GOALS: Goals reviewed with  patient? No   SHORT TERM GOALS: Target date: 08/12/2022   Pt will be independent with HEP in order to improve strength and balance in order to decrease fall risk and improve function at home and work. Baseline: 08/05/22  Goal status: Achieved     LONG TERM GOALS: Target date: 10/07/2022   Patient will have improved function and activity level as evidenced by an increase in FOTO score by 10 points or more.  Baseline: 56/100 with target of 70  09/08/22: 61/100  Goal status: Ongoing    2.  Patient will improve hip strength  by 1/3 grade MMT (ie 4- to 4) for improved lumbar stability and reduction in symptoms and return to PLOF.  Baseline: Hip Flex R/L 4-/4-, Hip Ext R/L 4-/4-, Hip Abd R/L 4-/4-, Hip ER R/L4/4, Hip IR R/L 4/4  Goal status: Ongoing    3.  Patient will improve abdominal strength by 1/3 grade MMT (ie 4- to 4) for improve lumbar stability and reduction in symptoms and return to PLOF.  Baseline: Grade 4- on Sahramann 09/08/22: Level 5  Goal status: Achieved    PLAN:   PT FREQUENCY: 1-2x/week   PT DURATION: 10 weeks   PLANNED INTERVENTIONS: Therapeutic exercises, Therapeutic activity, Neuromuscular re-education, Balance training, Gait training, Patient/Family education, Joint mobilization, Joint manipulation, Aquatic Therapy, Dry Needling, Electrical stimulation, Spinal manipulation, Spinal mobilization, Cryotherapy, Moist heat, Manual therapy, and Re-evaluation.   PLAN FOR NEXT SESSION:  Progress note. Step downs without UE support. Sled push. Progress hip abduction strengthening exercises and rotational exercises   Ellin Goodie PT, DPT  1:50 PM, 09/08/22

## 2022-09-10 ENCOUNTER — Encounter: Payer: Self-pay | Admitting: Physical Therapy

## 2022-09-10 ENCOUNTER — Ambulatory Visit: Payer: Medicare Other | Admitting: Physical Therapy

## 2022-09-10 DIAGNOSIS — M5459 Other low back pain: Secondary | ICD-10-CM | POA: Diagnosis not present

## 2022-09-10 DIAGNOSIS — M6281 Muscle weakness (generalized): Secondary | ICD-10-CM | POA: Diagnosis not present

## 2022-09-10 NOTE — Therapy (Signed)
OUTPATIENT PHYSICAL THERAPY PROGRESS and DISCHARGE NOTE    Patient Name: Denise Parker MRN: [id number] DOB:1950/09/21, 72 y.o., female Today's Date: 09/10/2022  PCP: Dr. Vernona Rieger  REFERRING PROVIDER: Dr. Vernona Rieger   END OF SESSION:   PT End of Session - 09/10/22 1349     Visit Number 10    Number of Visits 20    Date for PT Re-Evaluation 10/07/22    Authorization Type Medicare & Aetna    Authorization Time Period Medicare primary; AARP secondary    Authorization - Visit Number 10    Authorization - Number of Visits 20    Progress Note Due on Visit 10    PT Start Time 1345    PT Stop Time 1415    PT Time Calculation (min) 30 min    Activity Tolerance Patient tolerated treatment well;No increased pain    Behavior During Therapy [facility] for tasks assessed/performed               Past Medical History:  Diagnosis Date   Arthritis    Bronchitis 01/30/2021   Bursitis    back   Chronic gastritis without bleeding 05/30/2019   Cough in adult 01/30/2021   Herpes zoster 04/13/2013   HLD (hyperlipidemia)    HOH (hard of hearing)    Urinary frequency 11/21/2021   Past Surgical History:  Procedure Laterality Date   BREAST CYST ASPIRATION Left 90s   benign   CATARACT EXTRACTION W/PHACO Left 03/26/2017   Procedure: CATARACT EXTRACTION PHACO AND INTRAOCULAR LENS PLACEMENT (IOC);  Surgeon: Nevada Crane, MD;  Location: [facility] ORS;  Service: Ophthalmology;  Laterality: Left;   fluid pack lot #   [id number] H [place]    00:23.1 AP%  7.5 CDE   1.71    CATARACT EXTRACTION W/PHACO Right 07/30/2017   Procedure: CATARACT EXTRACTION PHACO AND INTRAOCULAR LENS PLACEMENT (IOC);  Surgeon: Nevada Crane, MD;  Location: [facility] OR[facility];  Service: Ophthalmology;  Laterality: Right;  fluid pack lot #  [id number]  Exp  03/26/2018 [place]   00:32.1 AP%   5.9 CDE   1.81   COLONOSCOPY WITH PROPOFOL N/A 09/11/2017   Procedure: COLONOSCOPY WITH PROPOFOL;  Surgeon: Pasty Spillers, MD;   Location: [facility];  Service: Gastroenterology;  Laterality: N/A;   TONSILLECTOMY     TUBAL LIGATION     Patient Active Problem List   Diagnosis Date Noted   Acute pain of right knee 07/24/2022   Elevated blood pressure reading in office without diagnosis of hypertension 11/21/2021   Heavy alcohol use 05/30/2019   Restless legs 05/30/2019   Allergic reaction 06/29/2018   GERD (gastroesophageal reflux disease) 03/03/2017   Trochanteric bursitis, right hip 10/21/2016   Osteoporosis 09/09/2016   Chronic back pain 07/15/2016   Decreased hearing 07/15/2016   Myalgia 10/08/2015   Arthritis 06/20/2015   HLD (hyperlipidemia) 06/20/2015   Metatarsalgia 09/27/2012   Allergic rhinitis with postnasal drip 06/16/2011    REFERRING DIAG: M54.50,G89.29 (ICD-10-CM) - Chronic low back pain, unspecified back pain laterality, unspecified whether sciatica present   THERAPY DIAG:  Other low back pain  Muscle weakness (generalized)  Rationale for Evaluation and Treatment Rehabilitation  PERTINENT HISTORY: Pt reports having ongoing back pain since 2016 which she attributes to her degenerative disc disease. She has been doing physical therapy for the past several years off and on. Her most recent episode occurred after traveling where she felt that laying on different surfaces and sitting for prolonged times trigger symptoms. She  know feels pain that radiates down her right hip and into right groin. She reports also hurting her right knee when going up and down stairs at a vacation home that had stairs to get up to loft. She describes the right knee and hip hurting her the most. Her knee has hurt her to the point where she has decreased her activity with decreased walking and high intensity exercise. She prefers laying on her right side, but she has had to lay on her back to sleep because of right knee pain.   PRECAUTIONS: None   SUBJECTIVE:                                                                                                                                                                                       SUBJECTIVE STATEMENT: Pt states that she continues to feel no increase in pain after walking 3 miles.   PAIN:  Are you having pain? No   OBJECTIVE:   TODAY'S TREATMENT:                                                                                                                              DATE:    09/10/22: TM 2.0 mph for 5 min  Hip MMT: -Flex R/L 4+/4+ -Abd R/L 4/4  - Ext R/L 4/4  -IR R/L 4+/4+ -ER R/L 4+/4+  Reviewed home exercise plan along with progressions and pt was able to demonstrate understanding.   09/08/22:  TM 2.0 mph for 5 min  Sahrmann Level: 5  Standing Eccentric Step Down from 6 inch step onto 2 inch yoga block with BUE support 1 x 10  Standing Eccentric Step Down from 6 inch step onto 1 inch piece of wood with BUE support 1 x 10  -Pt reports that this depth is more similar to her setup at home  Standing Side Bend with #8 lb water jug 2 x 10  -min VC to avoid shoulder elevation Pallof Press #5 3 x 10   09/04/22: Nu-Step with seat and arms set 8 with resistance at 3 for 5 min  OMEGA  Leg Press #55 1 x 15  OMEGA Leg Press #65 1 x 15 OMEGA Leg Press #85 1 x 15  -Pt feels most fatigue when performing this weight at 15 reps  FOTO: 61/100 with target of 70 OMEGA Knee Ext #10 3 x 15  OMEGA HS #20 3 x 15   MANUAL  Supine HS Stretch 4 x 30 sec  Single Knee to Chest 4 x 30 sec    09/02/22:  TM 2.5 mph with BUE support for 5 min  Pallof press on OMEGA #10 3 x 10  -min VC to increased elbow extension  Standing March with Alternating Shoulder Press #2 DB 3 x 10  -Pt positioned against wall to avoid imbalance  Sled Push with #130 lbs including weight of sled forward x 4  Sled Push with #130 lbs including weight of sled backward x 4 Sled Push with #130 lbs including weight of sled marches x 2  Mini-Squat with water jug to 18 inch surface 1  x 10  -Pt unable to maintain upright posture  Step Up on 6 inch step with water jug 1 x 10  Step Up on 8 inch step with water jug 2 x 10  -min VC to step up with right and down with left    PATIENT EDUCATION:  Education details: form and technique for correct form with exercise  Person educated: Patient Education method: Programmer, multimedia, Demonstration, Verbal cues, and Handouts Education comprehension: verbalized understanding, returned demonstration, verbal cues required, and needs further education   HOME EXERCISE PROGRAM: Access Code: [email or web address] URL: [email or web address] Date: 09/10/2022 Prepared by: Ellin Goodie  Exercises - Prone Quadriceps Stretch with Strap  - 1 x daily - 3 reps - 60 sec hold - Thomas Stretch on Table  - 1 x daily - 3 reps - 30-60 sec  hold - Standing March with Alternating Shoulder Press   - 3-4 x weekly - 3 sets - 10 reps - Standing Hip Abduction with Counter Support  - 3-4 x weekly - 3 sets - 10 reps - Forward Step Down  - 3-4 x weekly - 3 sets - 10 reps - Mini Squat  - 3-4 x weekly - 3 sets - 10 reps  ASSESSMENT:   CLINICAL IMPRESSION: Pt has now met all of her rehab goals with exception of perception of low back function. She shows improved LE and abdominal strength that has translated into her being able to return to walking for recreational exercise. She has been educated on home exercise plan along with progressions to maintain functional gains.   OBJECTIVE IMPAIRMENTS: difficulty walking, decreased ROM, decreased strength, impaired flexibility, postural dysfunction, and pain.    ACTIVITY LIMITATIONS: carrying, lifting, bending, standing, squatting, and stairs   PARTICIPATION LIMITATIONS: driving and community activity   PERSONAL FACTORS: Age, Past/current experiences, Time since onset of injury/illness/exacerbation.  are also affecting patient's functional outcome.    REHAB POTENTIAL: Good   CLINICAL DECISION MAKING:  Stable/uncomplicated   EVALUATION COMPLEXITY: Low     GOALS: Goals reviewed with patient? No   SHORT TERM GOALS: Target date: 08/12/2022   Pt will be independent with HEP in order to improve strength and balance in order to decrease fall risk and improve function at home and work. Baseline: 08/05/22  Goal status: Achieved     LONG TERM GOALS: Target date: 10/07/2022   Patient will have improved function and activity level as evidenced by an increase in FOTO score by 10 points or more.  Baseline:  56/100 with target of 70  09/08/22: 61/100  Goal status: Not Met    2.  Patient will improve hip strength by 1/3 grade MMT (ie 4- to 4) for improved lumbar stability and reduction in symptoms and return to PLOF.  Baseline: Hip Flex R/L 4-/4-, Hip Ext R/L 4-/4-, Hip Abd R/L 4-/4-, Hip ER R/L4/4, Hip IR R/L 4/4 Hip Flex R/L 4+/4+, Hip Ext R/L 4/4, Hip Abd R/L 4/4 Hip ER R/L 4+/4+, Hip IR R/L 4+/4+ Goal status: Achieved    3.  Patient will improve abdominal strength by 1/3 grade MMT (ie 4- to 4) for improve lumbar stability and reduction in symptoms and return to PLOF.  Baseline: Grade 4- on Sahramann 09/08/22: Level 5  Goal status: Achieved    PLAN:   PT FREQUENCY: 1-2x/week   PT DURATION: 10 weeks   PLANNED INTERVENTIONS: Therapeutic exercises, Therapeutic activity, Neuromuscular re-education, Balance training, Gait training, Patient/Family education, Joint mobilization, Joint manipulation, Aquatic Therapy, Dry Needling, Electrical stimulation, Spinal manipulation, Spinal mobilization, Cryotherapy, Moist heat, Manual therapy, and Re-evaluation.   PLAN FOR NEXT SESSION:  Discharge from PT   Ellin Goodie PT, DPT  1:50 PM, 09/10/22

## 2022-09-15 ENCOUNTER — Encounter: Payer: 59 | Admitting: Physical Therapy

## 2022-09-17 ENCOUNTER — Encounter: Payer: 59 | Admitting: Physical Therapy

## 2022-09-23 ENCOUNTER — Encounter: Payer: 59 | Admitting: Physical Therapy

## 2022-09-29 ENCOUNTER — Encounter: Payer: 59 | Admitting: Physical Therapy

## 2022-10-01 ENCOUNTER — Encounter: Payer: 59 | Admitting: Physical Therapy

## 2022-10-02 DIAGNOSIS — H43813 Vitreous degeneration, bilateral: Secondary | ICD-10-CM | POA: Diagnosis not present

## 2022-10-02 DIAGNOSIS — H04123 Dry eye syndrome of bilateral lacrimal glands: Secondary | ICD-10-CM | POA: Diagnosis not present

## 2022-10-02 DIAGNOSIS — Z961 Presence of intraocular lens: Secondary | ICD-10-CM | POA: Diagnosis not present

## 2022-10-28 ENCOUNTER — Ambulatory Visit
Admission: RE | Admit: 2022-10-28 | Discharge: 2022-10-28 | Disposition: A | Discharge status: Home / Self Care | Payer: Medicare Other | Source: Ambulatory Visit | Attending: Internal Medicine | Admitting: Internal Medicine

## 2022-10-28 ENCOUNTER — Ambulatory Visit
Admission: RE | Admit: 2022-10-28 | Discharge: 2022-10-28 | Disposition: A | Discharge status: Home / Self Care | Payer: Medicare Other | Source: Ambulatory Visit | Attending: Primary Care | Admitting: Primary Care

## 2022-10-28 DIAGNOSIS — Z1231 Encounter for screening mammogram for malignant neoplasm of breast: Secondary | ICD-10-CM | POA: Insufficient documentation

## 2022-10-28 DIAGNOSIS — M810 Age-related osteoporosis without current pathological fracture: Secondary | ICD-10-CM | POA: Insufficient documentation

## 2022-10-28 DIAGNOSIS — Z780 Asymptomatic menopausal state: Secondary | ICD-10-CM | POA: Diagnosis not present

## 2022-10-29 ENCOUNTER — Telehealth: Payer: Self-pay | Admitting: Internal Medicine

## 2022-10-29 NOTE — Telephone Encounter (Signed)
Please schedule the patient for follow-up visit with me to discuss bone density results, preferably within the next month.   Thanks

## 2022-11-04 DIAGNOSIS — Z23 Encounter for immunization: Secondary | ICD-10-CM | POA: Diagnosis not present

## 2022-12-03 ENCOUNTER — Encounter: Payer: Self-pay | Admitting: Internal Medicine

## 2022-12-03 ENCOUNTER — Ambulatory Visit (INDEPENDENT_AMBULATORY_CARE_PROVIDER_SITE_OTHER): Payer: Medicare Other | Admitting: Internal Medicine

## 2022-12-03 VITALS — BP 120/78 | HR 58 | Ht 64.0 in | Wt 197.0 lb

## 2022-12-03 DIAGNOSIS — M810 Age-related osteoporosis without current pathological fracture: Secondary | ICD-10-CM

## 2022-12-03 LAB — BASIC METABOLIC PANEL
BUN: 9 mg/dL (ref 6–23)
CO2: 26 meq/L (ref 19–32)
Calcium: 9.2 mg/dL (ref 8.4–10.5)
Chloride: 105 meq/L (ref 96–112)
Creatinine, Ser: 0.54 mg/dL (ref 0.40–1.20)
GFR: 91.05 mL/min (ref >60.00)
Glucose, Bld: 92 mg/dL (ref 70–99)
Potassium: 3.9 meq/L (ref 3.5–5.1)
Sodium: 141 meq/L (ref 135–145)

## 2022-12-03 LAB — VITAMIN D 25 HYDROXY (VIT D DEFICIENCY, FRACTURES): VITD: 43.89 ng/mL (ref 30.00–100.00)

## 2022-12-03 NOTE — Patient Instructions (Signed)
Calcium 600mg  twice daily

## 2022-12-03 NOTE — Progress Notes (Signed)
Name: Denise Parker  MRN/ DOB: [id number], [birth year]    Age/ Sex: 72 y.o., female    PCP: Doreene Nest, NP   Reason for Endocrinology Evaluation: Osteoporosis     Date of Initial Endocrinology Evaluation: 04/02/2022    HPI: Denise Parker is a 72 y.o. female with a past medical history of dyslipidemia, osteoporosis, restless leg syndrome. The patient presented for initial endocrinology clinic visit on 04/02/2022 for consultative assistance with her osteoporosis.   Pt was diagnosed with osteoporosis:2018 with a T-score of -3.4 at the left distal radius    Menarche at age : 26 Menopausal at age : early 32's  Fracture Hx: right wrist - 1990 ( slipped on ice) and in 2012- right tibial (stepped off pavement )   Hx of HRT: yes, short period of time ( few months)  FH of osteoporosis or hip fracture: no Prior Hx of anti-resorptive therapy : was started on Fosamax  in 2018 but discontinued due to   esophageal/chest pain  by 03/2017 Started  Prolia 10/2017 until 06/2022   Radiation exposure - none   24-hour urinary calcium was normal at 173 Mg as well as cortisol 49.8 mcg 03/2022   SUBJECTIVE:    Today (12/03/22):  Denise Parker is here for follow-up on osteoporosis.   Last Prolia injection was on 07/17/2022 She had a fall in the summer  No bone fractures  Denies heartburn  Denies constipation or diarrhea  Has right knee pain     Caltrate/D  600 mg twice daily   HISTORY:  Past Medical History:  Past Medical History:  Diagnosis Date   Arthritis    Bronchitis 01/30/2021   Bursitis    back   Chronic gastritis without bleeding 05/30/2019   Cough in adult 01/30/2021   Herpes zoster 04/13/2013   HLD (hyperlipidemia)    HOH (hard of hearing)    Urinary frequency 11/21/2021   Past Surgical History:  Past Surgical History:  Procedure Laterality Date   BREAST CYST ASPIRATION Left 90s   benign   CATARACT EXTRACTION W/PHACO Left 03/26/2017    Procedure: CATARACT EXTRACTION PHACO AND INTRAOCULAR LENS PLACEMENT (IOC);  Surgeon: Nevada Crane, MD;  Location: [facility];  Service: Ophthalmology;  Laterality: Left;   fluid pack lot #   [id number] H [place]    00:23.1 AP%  7.5 CDE   1.71    CATARACT EXTRACTION W/PHACO Right 07/30/2017   Procedure: CATARACT EXTRACTION PHACO AND INTRAOCULAR LENS PLACEMENT (IOC);  Surgeon: Nevada Crane, MD;  Location: [facility];  Service: Ophthalmology;  Laterality: Right;  fluid pack lot #  [id number] H  Exp  03/26/2018 [place]   00:32.1 AP%   5.9 CDE   1.81   COLONOSCOPY WITH PROPOFOL N/A 09/11/2017   Procedure: COLONOSCOPY WITH PROPOFOL;  Surgeon: Pasty Spillers, MD;  Location: [facility];  Service: Gastroenterology;  Laterality: N/A;   TONSILLECTOMY     TUBAL LIGATION      Social History:  reports that she has never smoked. She has never used smokeless tobacco. She reports current alcohol use of about 21.0 standard drinks of alcohol per week. She reports that she does not use drugs. Family History: family history includes Breast cancer in her sister; Breast cancer (age of onset: 33) in her sister; Colon cancer (age of onset: 62) in her sister; Heart attack (age of onset: 60) in her brother; Heart disease in her father and mother; Leukemia in her sister; Lupus in  her father, sister, and sister; Stroke in her mother.   HOME MEDICATIONS: Allergies as of 12/03/2022       Reactions   Probiotic Product Itching, Swelling   Can't remember the name, it was a women's probiotic supplement. It cause burning, itching and swelling of the ears        Medication List        Accurate as of December 03, 2022 11:36 AM. If you have any questions, ask your nurse or doctor.          STOP taking these medications    Prolia 60 MG/ML Sosy injection Generic drug: denosumab Stopped by: Johnney Ou Suhey Radford       TAKE these medications    atorvastatin 40 MG tablet Commonly known as: LIPITOR Take 1 tablet (40  mg total) by mouth daily. for cholesterol.   BIOFREEZE EX Apply 1 application topically daily as needed (bursitis in the hip and back pain).   Caltrate 600+D Plus Minerals 600-800 MG-UNIT Tabs Take 2 tablets by mouth.   cetirizine 10 MG tablet Commonly known as: ZYRTEC Take 1 tablet (10 mg total) by mouth daily as needed for allergies.   COLLAGEN PO Take by mouth.   CRANBERRY-VITAMIN C PO Take 1 tablet by mouth daily.   fluorometholone 0.1 % ophthalmic suspension Commonly known as: FML Place 1 drop into both eyes daily.   fluticasone 50 MCG/ACT nasal spray Commonly known as: FLONASE Place 1 spray into both nostrils 2 (two) times daily as needed for allergies or rhinitis.   GLUCOSAMINE CHONDROITIN COMPLX PO Take 1 tablet by mouth daily.   MELATONIN PO Take by mouth.   OMEGA 3 500 PO Take 2 capsules by mouth daily at 6 (six) AM.   Turmeric 500 MG Tabs Take 500 mg by mouth 3 (three) times daily.   vitamin E 180 MG (400 UNITS) capsule Take 400 Units by mouth daily.          REVIEW OF SYSTEMS: A comprehensive ROS was conducted with the patient and is negative except as per HPI    OBJECTIVE:  VS: BP 120/78 (BP Location: Left Arm, Patient Position: Sitting, Cuff Size: Large)   Pulse (!) 58   Ht 5\' 4"  (1.626 m)   Wt 197 lb (89.4 kg)   SpO2 98%   BMI 33.81 kg/m    Wt Readings from Last 3 Encounters:  12/03/22 197 lb (89.4 kg)  08/20/22 199 lb (90.3 kg)  07/30/22 202 lb 2 oz (91.7 kg)     EXAM: General: Pt appears well and is in NAD  Neck: General: Supple without adenopathy. Thyroid: Thyroid size normal.  No goiter or nodules appreciated.   Lungs: Clear with good BS bilat with no rales, rhonchi, or wheezes  Heart: Auscultation: RRR.  Abdomen: soft, nontender  Extremities:  BL LE: No pretibial edema normal   Mental Status: Judgment, insight: Intact Orientation: Oriented to time, place, and person Mood and affect: No depression, anxiety, or agitation      DATA REVIEWED:   Latest Reference Range & Units 12/03/22 11:50  Sodium 135 - 145 mEq/L 141  Potassium 3.5 - 5.1 mEq/L 3.9  Chloride 96 - 112 mEq/L 105  CO2 19 - 32 mEq/L 26  Glucose 70 - 99 mg/dL 92  BUN 6 - 23 mg/dL 9  Creatinine 1.61 - 0.96 mg/dL 0.45  Calcium 8.4 - 40.9 mg/dL 9.2  GFR >81.19 mL/min 91.05  VITD 30.00 - 100.00 ng/mL 43.89  DXA 10/28/2022 DualFemur Total Right 10/28/2022 73.7 Normal -0.5 0.950 g/cm2 1.9% - DualFemur Total Right 10/09/2020 71.7 Normal -0.6 0.932 g/cm2 0.8% - DualFemur Total Right 02/08/2019 70.0 Normal -0.7 0.925 g/cm2 2.8% Yes DualFemur Total Right 09/02/2016 67.6 Normal -0.9 0.900 g/cm2 - -   DualFemur Total Mean 10/28/2022 73.7 Normal -0.4 0.963 g/cm2 2.9% Yes DualFemur Total Mean 10/09/2020 71.7 Normal -0.6 0.936 g/cm2 -0.2% - DualFemur Total Mean 02/08/2019 70.0 Normal -0.6 0.938 g/cm2 5.3% Yes DualFemur Total Mean 09/02/2016 67.6 Normal -0.9 0.891 g/cm2 - -   Left Forearm Radius 33% 10/28/2022 73.7 Osteoporosis -4.0 0.529 g/cm2 -7.4% Yes Left Forearm Radius 33% 10/09/2020 71.7 Osteoporosis -3.5 0.571 g/cm2 4.8% - Left Forearm Radius 33% 02/08/2019 70.0 Osteoporosis -3.8 0.545 g/cm2 -6.0% - Left Forearm Radius 33% 09/02/2016 67.6 Osteoporosis -3.4 0.580 g/cm2 - -     ASSESSMENT/PLAN/RECOMMENDATIONS:   Osteoporosis :  -Patient with severe osteoporosis and a  history of two fragility fractures in the past -She did not tolerate alendronate, due to chest pains when she eats or drinks (~esophageal spasms?) - Prolia was started 10/2017, until 06/2022 -Emphasized the importance of optimizing calcium and vitamin D intake -Most recent DXA scan showed improvement in BMD at the hips but not at the distal radius -The distal one third radius BMD was never used as an entry criteria or an outcome measure in any of the currently FDA approved medications including anabolic agents. -We will proceed with zoledronic acid x 1 dose -I have  recommended weightbearing exercises specifically for the wrist area   F/U 1 yr    I spent 28 minutes preparing to see the patient by review of recent labs, imaging and procedures, obtaining and reviewing separately obtained history, communicating with the patient/family or caregiver, ordering medications, tests or procedures, and documenting clinical information in the EHR including the differential Dx, treatment, and any further evaluation and other management    Signed electronically by: Lyndle Herrlich, MD  [facility]  [facility] [address] [place] [place], [place] [address] Phone: [phone] FAX: [phone]   CC: Doreene Nest, NP [address] [place] [address] Phone: [phone] Fax: [phone]   Return to Endocrinology clinic as below: Future Appointments  Date Time Provider Department Center  08/24/2023  2:30 PM [facility] ANNUAL WELLNESS VISIT 1 [facility]  12/03/2023 11:30 AM Jasie Meleski, Konrad Dolores, MD [facility] None

## 2022-12-04 ENCOUNTER — Telehealth: Payer: Self-pay

## 2022-12-04 LAB — PARATHYROID HORMONE, INTACT (NO CA): PTH: 36 pg/mL (ref 16–77)

## 2022-12-04 NOTE — Telephone Encounter (Signed)
Patient states that she was schedule for infusion next week. She is not due for her Prolia yet and wants to know if she should wait until the Prolia is due to do the infusion.

## 2022-12-04 NOTE — Telephone Encounter (Signed)
Patient aware.

## 2022-12-11 ENCOUNTER — Ambulatory Visit: Payer: Medicare Other

## 2022-12-11 VITALS — BP 129/81 | HR 52 | Temp 98.4°F | Resp 18 | Ht 63.5 in | Wt 197.2 lb

## 2022-12-11 DIAGNOSIS — M810 Age-related osteoporosis without current pathological fracture: Secondary | ICD-10-CM

## 2022-12-11 MED ORDER — ZOLEDRONIC ACID 5 MG/100ML IV SOLN
5.0000 mg | Freq: Once | INTRAVENOUS | Status: AC
  Administered 2022-12-11: 5 mg via INTRAVENOUS
  Filled 2022-12-11: qty 100

## 2022-12-11 MED ORDER — SODIUM CHLORIDE 0.9 % IV SOLN: INTRAVENOUS | Status: DC

## 2022-12-11 MED ORDER — ACETAMINOPHEN 325 MG PO TABS
650.0000 mg | ORAL_TABLET | Freq: Once | ORAL | Status: AC
  Administered 2022-12-11: 650 mg via ORAL
  Filled 2022-12-11: qty 2

## 2022-12-11 MED ORDER — DIPHENHYDRAMINE HCL 25 MG PO CAPS
25.0000 mg | ORAL_CAPSULE | Freq: Once | ORAL | Status: AC
  Administered 2022-12-11: 25 mg via ORAL
  Filled 2022-12-11: qty 1

## 2022-12-11 NOTE — Progress Notes (Signed)
Diagnosis: Osteoporosis  Provider:  Chilton Greathouse MD  Procedure: IV Infusion  IV Type: Peripheral, IV Location: R Antecubital  Reclast (Zolendronic Acid), Dose: 5 mg  Infusion Start Time: 1356  Infusion Stop Time: 1428  Post Infusion IV Care: Observation period completed and Peripheral IV Discontinued  Discharge: Condition: Stable, Destination: Home . AVS Declined  Performed by:  Wyvonne Lenz, RN

## 2022-12-16 ENCOUNTER — Other Ambulatory Visit: Payer: Self-pay | Admitting: Primary Care

## 2022-12-16 DIAGNOSIS — E785 Hyperlipidemia, unspecified: Secondary | ICD-10-CM

## 2022-12-29 ENCOUNTER — Other Ambulatory Visit: Payer: Self-pay | Admitting: Primary Care

## 2023-01-12 ENCOUNTER — Telehealth: Payer: Self-pay | Admitting: Primary Care

## 2023-01-12 NOTE — Telephone Encounter (Signed)
[facility] pharmacy called, scheduled delivery date for prolia tomorrow 01/13/2023 during business hours.

## 2023-03-03 ENCOUNTER — Ambulatory Visit (INDEPENDENT_AMBULATORY_CARE_PROVIDER_SITE_OTHER): Payer: Medicare Other | Admitting: Primary Care

## 2023-03-03 ENCOUNTER — Encounter: Payer: Self-pay | Admitting: Primary Care

## 2023-03-03 VITALS — BP 124/76 | HR 72 | Temp 97.2°F | Ht 63.5 in | Wt 197.0 lb

## 2023-03-03 DIAGNOSIS — T7840XA Allergy, unspecified, initial encounter: Secondary | ICD-10-CM

## 2023-03-03 DIAGNOSIS — K121 Other forms of stomatitis: Secondary | ICD-10-CM | POA: Diagnosis not present

## 2023-03-03 MED ORDER — TRIAMCINOLONE ACETONIDE 0.1 % MT PSTE: 1.0000 | PASTE | Freq: Two times a day (BID) | OROMUCOSAL | 0 refills | Status: DC

## 2023-03-03 NOTE — Assessment & Plan Note (Signed)
 Obvious on exam today. 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  Start triamcinolone 0.1% paste. Apply twice daily x 1-2 weeks.  Food allergy testing panel ordered and pending.

## 2023-03-03 NOTE — Assessment & Plan Note (Signed)
 Unclear if she actually experienced allergic reaction, but there is a classic oral ulcer on exam.  Will order food allergy panel for further evaluation. Treat oral ulcer with triamcinolone paste.

## 2023-03-03 NOTE — Patient Instructions (Signed)
 Apply the triamcinolone paste twice daily to the sore in your mouth for 1-2 weeks until resolved.  Stop by the lab prior to leaving today. I will notify you of your results once received.   It was a pleasure to see you today!

## 2023-03-03 NOTE — Progress Notes (Signed)
 Subjective:    Patient ID: Denise Parker, female    DOB: 12-17-1950, 73 y.o.   MRN: [id number]  Allergic Reaction Pertinent negatives include no coughing, rash or trouble swallowing.    Denise Parker is a very pleasant 73 y.o. female with a history of allergic rhinitis,GERD, allergic reaction who presents today to discuss allergic reaction.  She believes she had an allergic reaction the day after consuming fried calamari.  She ate fried calamari on New years eve day in the afternoon, suddenly noticed swelling to the back of her left upper palate which increased from the size of a pea to the size of a quarter rapidly. She proceeded to [facility] for Benadryl , but by the time she took the Benadryl  the swelling had improved. At the time she took 2 Benadryl  in the afternoon and an additional 2 Benadryl  at bedtime.   The following morning she noticed left ear swelling, an abscess to the left upper palate, runny eye to the left side, burning inside of her nose.   She's been using sinus rinses and taken a few more Benadryl . Today she's noticed some improvement in her symptoms. The abscess doesn't cause much pain, but she does notice discomfort.   Prior to symptom onset she did experience a lot of sneezing, post nasal drip, and other typical allergy  symptoms for about 1 week.   She denies wheezing, chest tightness, throat tightness, wheezing, sore throat, shortness of breath, rash, prior seafood allergy , prior problems with calamari.    Review of Systems  Constitutional:  Negative for fever.  HENT:  Positive for ear pain, facial swelling, postnasal drip and rhinorrhea. Negative for sore throat and trouble swallowing.   Respiratory:  Negative for cough.   Skin:  Negative for rash.         Past Medical History:  Diagnosis Date   Arthritis    Bronchitis 01/30/2021   Bursitis    back   Chronic gastritis without bleeding 05/30/2019   Cough in adult 01/30/2021   Herpes  zoster 04/13/2013   HLD (hyperlipidemia)    HOH (hard of hearing)    Urinary frequency 11/21/2021    Social History   Socioeconomic History   Marital status: Married    Spouse name: Alm   Number of children: 1   Years of education: it sales professional   Highest education level: Master's degree (e.g., MA, MS, MEng, MEd, MSW, MBA)  Occupational History   Not on file  Tobacco Use   Smoking status: Never   Smokeless tobacco: Never  Vaping Use   Vaping status: Never Used  Substance and Sexual Activity   Alcohol use: Yes    Alcohol/week: 21.0 standard drinks of alcohol    Types: 21 Glasses of wine per week    Comment: Wine-regular   Drug use: No   Sexual activity: Yes    Birth control/protection: Surgical, Post-menopausal  Other Topics Concern   Not on file  Social History Narrative      Does not have a living will or HPOA   Desires CPR, would not want prolonged life support if futile.      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[employer]      Family: Son - Alm, has 5 grandchildren (2 grown in [place] ) and 3 live here  Enjoys: reading, listen to books on tape, walk, hike, volunteering at the senior center      Exercise: walking - 5-7 times a week, 4 miles per day   Diet: pretty good, limits pork and beef, fish once a week      Safety   Seat belts: Yes    Guns: Yes  and secure   Safe in relationships: Yes       Social Drivers of Health   Financial Resource Strain: Low Risk  (03/02/2023)   Overall Financial Resource Strain (CARDIA)    Difficulty of Paying Living Expenses: Not hard at all  Food Insecurity: No Food Insecurity (03/02/2023)   Hunger Vital Sign    Worried About Running Out of Food in the Last Year: Never true    Ran Out of Food in the Last Year: Never true  Transportation Needs: No  Transportation Needs (03/02/2023)   PRAPARE - Administrator, Civil Service (Medical): No    Lack of Transportation (Non-Medical): No  Physical Activity: Sufficiently Active (03/02/2023)   Exercise Vital Sign    Days of Exercise per Week: 3 days    Minutes of Exercise per Session: 60 min  Stress: Stress Concern Present (03/02/2023)   [employer] of Occupational Health - Occupational Stress Questionnaire    Feeling of Stress : To some extent  Social Connections: Unknown (03/02/2023)   Social Connection and Isolation Panel [NHANES]    Frequency of Communication with Friends and Family: Once a week    Frequency of Social Gatherings with Friends and Family: Once a week    Attends Religious Services: Patient declined    Database Administrator or Organizations: Yes    Attends Engineer, Structural: More than 4 times per year    Marital Status: Married  Catering Manager Violence: Not At Risk (08/20/2022)   Humiliation, Afraid, Rape, and Kick questionnaire    Fear of Current or Ex-Partner: No    Emotionally Abused: No    Physically Abused: No    Sexually Abused: No    Past Surgical History:  Procedure Laterality Date   BREAST CYST ASPIRATION Left 90s   benign   CATARACT EXTRACTION W/PHACO Left 03/26/2017   Procedure: CATARACT EXTRACTION PHACO AND INTRAOCULAR LENS PLACEMENT (IOC);  Surgeon: Myrna Adine Anes, MD;  Location: [facility] ORS;  Service: Ophthalmology;  Laterality: Left;   fluid pack lot #   [id number] H US     00:23.1 AP%  7.5 CDE   1.71    CATARACT EXTRACTION W/PHACO Right 07/30/2017   Procedure: CATARACT EXTRACTION PHACO AND INTRAOCULAR LENS PLACEMENT (IOC);  Surgeon: Myrna Adine Anes, MD;  Location: [facility] ORS;  Service: Ophthalmology;  Laterality: Right;  fluid pack lot #  [id number] H  Exp  03/26/2018 US    00:32.1 AP%   5.9 CDE   1.81   COLONOSCOPY WITH PROPOFOL  N/A 09/11/2017   Procedure: COLONOSCOPY WITH PROPOFOL ;  Surgeon: Janalyn Keene NOVAK, MD;  Location: [facility]  ENDOSCOPY;  Service: Gastroenterology;  Laterality: N/A;   TONSILLECTOMY     TUBAL LIGATION      Family History  Problem Relation Age of Onset   Heart disease Mother    Stroke Mother    Heart disease Father    Lupus Father    Breast cancer Sister 25   Leukemia Sister    Colon cancer Sister 36   Lupus Sister    Breast cancer Sister    Lupus Sister    Heart attack Brother 53  Allergies  Allergen Reactions   Probiotic Product Itching and Swelling    Can't remember the name, it was a women's probiotic supplement. It cause burning, itching and swelling of the ears    Current Outpatient Medications on File Prior to Visit  Medication Sig Dispense Refill   atorvastatin  (LIPITOR) 40 MG tablet TAKE 1 TABLET(40 MG) BY MOUTH DAILY FOR CHOLESTEROL 90 tablet 1   Calcium  Carbonate-Vit D-Min (CALTRATE 600+D PLUS MINERALS) 600-800 MG-UNIT TABS Take 2 tablets by mouth.     cetirizine  (ZYRTEC ) 10 MG tablet Take 1 tablet (10 mg total) by mouth daily as needed for allergies. 90 tablet 1   COLLAGEN PO Take by mouth.     CRANBERRY-VITAMIN C PO Take 1 tablet by mouth daily.      fluorometholone (FML) 0.1 % ophthalmic suspension Place 1 drop into both eyes daily.     fluticasone  (FLONASE ) 50 MCG/ACT nasal spray Place 1 spray into both nostrils 2 (two) times daily as needed for allergies or rhinitis. 48 g 0   MELATONIN PO Take by mouth.     Menthol, Topical Analgesic, (BIOFREEZE EX) Apply 1 application topically daily as needed (bursitis in the hip and back pain).     Omega-3 Fatty Acids (OMEGA 3 500 PO) Take 2 capsules by mouth daily at 6 (six) AM.     Turmeric 500 MG TABS Take 500 mg by mouth 3 (three) times daily.     vitamin E 400 UNIT capsule Take 400 Units by mouth daily.     denosumab  (PROLIA ) 60 MG/ML SOSY injection INJECT 60MG  SUBCUTANEOUSLY ONCE  AS DIRECTED (Patient not taking: Reported on 03/03/2023) 1 mL 1   GLUCOSAMINE CHONDROITIN COMPLX PO Take 1 tablet by mouth daily.  (Patient not  taking: Reported on 03/03/2023)     No current facility-administered medications on file prior to visit.    BP 124/76   Pulse 72   Temp (!) 97.2 F (36.2 C) (Temporal)   Ht 5' 3.5 (1.613 m)   Wt 197 lb (89.4 kg)   SpO2 97%   BMI 34.35 kg/m  Objective:   Physical Exam HENT:     Right Ear: Tympanic membrane normal.     Left Ear: Tympanic membrane normal.     Mouth/Throat:     Mouth: Mucous membranes are moist. Oral lesions present.     Dentition: No dental abscesses.     Tongue: No lesions.     Palate: Lesions present.     Pharynx: No pharyngeal swelling, oropharyngeal exudate or uvula swelling.     Tonsils: No tonsillar abscesses.      Comments: 1.5 cm x 0.5 cm ulcer to left upper palate.  Cardiovascular:     Rate and Rhythm: Normal rate and regular rhythm.  Pulmonary:     Effort: Pulmonary effort is normal.     Breath sounds: Normal breath sounds.  Musculoskeletal:     Cervical back: Neck supple.  Skin:    General: Skin is warm and dry.  Neurological:     Mental Status: She is alert and oriented to person, place, and time.  Psychiatric:        Mood and Affect: Mood normal.           Assessment & Plan:  Oral ulcer Assessment & Plan: Obvious on exam today. 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  Start triamcinolone  0.1% paste. Apply twice daily x 1-2 weeks.  Food allergy  testing panel ordered and pending.  Orders: -     Triamcinolone  Acetonide; Use as directed 1 Application in the mouth or throat 2 (two) times daily.  Dispense: 5 g; Refill: 0 -     Food Allergy  Profile  Allergic reaction, initial encounter Assessment & Plan: Unclear if she actually experienced allergic reaction, but there is a classic oral ulcer on exam.  Will order food allergy  panel for further evaluation. Treat oral ulcer with triamcinolone  paste.         Nevia Henkin K Walaa Carel, NP

## 2023-03-04 DIAGNOSIS — K1370 Unspecified lesions of oral mucosa: Secondary | ICD-10-CM

## 2023-03-04 LAB — FOOD ALLERGY PROFILE
Allergen, Salmon, f41: <0.1 kU/L
Almonds: <0.1 kU/L
CLASS: 0
CLASS: 0
CLASS: 0
CLASS: 0
CLASS: 0
CLASS: 0
CLASS: 0
CLASS: 0
CLASS: 0
CLASS: 0
CLASS: 0
Cashew IgE: <0.1 kU/L
Class: 0
Class: 0
Class: 0
Class: 0
Egg White IgE: <0.1 kU/L
Fish Cod: <0.1 kU/L
Hazelnut: <0.1 kU/L
Milk IgE: <0.1 kU/L
Peanut IgE: <0.1 kU/L
Scallop IgE: <0.1 kU/L
Sesame Seed f10: <0.1 kU/L
Shrimp IgE: <0.1 kU/L
Soybean IgE: <0.1 kU/L
Tuna IgE: <0.1 kU/L
Walnut: <0.1 kU/L
Wheat IgE: <0.1 kU/L

## 2023-03-04 LAB — INTERPRETATION:

## 2023-03-14 ENCOUNTER — Other Ambulatory Visit: Payer: Self-pay | Admitting: Primary Care

## 2023-03-14 DIAGNOSIS — R0982 Postnasal drip: Secondary | ICD-10-CM

## 2023-03-18 ENCOUNTER — Other Ambulatory Visit: Payer: Self-pay | Admitting: Primary Care

## 2023-03-18 DIAGNOSIS — E785 Hyperlipidemia, unspecified: Secondary | ICD-10-CM

## 2023-03-18 NOTE — Telephone Encounter (Signed)
Patient is due for CPE/follow up in April, this will be required prior to any further refills.  Please schedule, thank you!   

## 2023-05-15 DIAGNOSIS — H903 Sensorineural hearing loss, bilateral: Secondary | ICD-10-CM | POA: Diagnosis not present

## 2023-06-18 ENCOUNTER — Other Ambulatory Visit: Payer: Self-pay

## 2023-06-18 DIAGNOSIS — J309 Allergic rhinitis, unspecified: Secondary | ICD-10-CM

## 2023-06-18 MED ORDER — FLUTICASONE PROPIONATE 50 MCG/ACT NA SUSP: 2.0000 | Freq: Every day | NASAL | 0 refills | Status: DC

## 2023-06-18 NOTE — Telephone Encounter (Signed)
Patient is due for CPE/follow up in May, this will be required prior to any further refills.  Please schedule, thank you!   

## 2023-06-19 NOTE — Telephone Encounter (Signed)
 lvm for pt to call office to schedule appt.

## 2023-08-21 ENCOUNTER — Encounter: Payer: Self-pay | Admitting: Primary Care

## 2023-08-21 ENCOUNTER — Ambulatory Visit (INDEPENDENT_AMBULATORY_CARE_PROVIDER_SITE_OTHER): Admitting: Primary Care

## 2023-08-21 VITALS — BP 126/78 | HR 58 | Temp 97.4°F | Ht 63.5 in | Wt 198.0 lb

## 2023-08-21 DIAGNOSIS — K219 Gastro-esophageal reflux disease without esophagitis: Secondary | ICD-10-CM

## 2023-08-21 DIAGNOSIS — R030 Elevated blood-pressure reading, without diagnosis of hypertension: Secondary | ICD-10-CM

## 2023-08-21 DIAGNOSIS — R0982 Postnasal drip: Secondary | ICD-10-CM

## 2023-08-21 DIAGNOSIS — E785 Hyperlipidemia, unspecified: Secondary | ICD-10-CM

## 2023-08-21 DIAGNOSIS — G8929 Other chronic pain: Secondary | ICD-10-CM | POA: Diagnosis not present

## 2023-08-21 DIAGNOSIS — M545 Low back pain, unspecified: Secondary | ICD-10-CM | POA: Diagnosis not present

## 2023-08-21 DIAGNOSIS — G2581 Restless legs syndrome: Secondary | ICD-10-CM

## 2023-08-21 DIAGNOSIS — M1990 Unspecified osteoarthritis, unspecified site: Secondary | ICD-10-CM | POA: Diagnosis not present

## 2023-08-21 DIAGNOSIS — J309 Allergic rhinitis, unspecified: Secondary | ICD-10-CM

## 2023-08-21 DIAGNOSIS — M810 Age-related osteoporosis without current pathological fracture: Secondary | ICD-10-CM

## 2023-08-21 LAB — CBC
HCT: 41.5 % (ref 36.0–46.0)
Hemoglobin: 13.6 g/dL (ref 12.0–15.0)
MCHC: 32.7 g/dL (ref 30.0–36.0)
MCV: 85.2 fl (ref 78.0–100.0)
Platelets: 226 10*3/uL (ref 150.0–400.0)
RBC: 4.87 Mil/uL (ref 3.87–5.11)
RDW: 14.5 % (ref 11.5–15.5)
WBC: 4.8 10*3/uL (ref 4.0–10.5)

## 2023-08-21 LAB — COMPREHENSIVE METABOLIC PANEL WITH GFR
ALT: 25 U/L (ref 0–35)
AST: 24 U/L (ref 0–37)
Albumin: 4.4 g/dL (ref 3.5–5.2)
Alkaline Phosphatase: 55 U/L (ref 39–117)
BUN: 12 mg/dL (ref 6–23)
CO2: 30 meq/L (ref 19–32)
Calcium: 9.1 mg/dL (ref 8.4–10.5)
Chloride: 104 meq/L (ref 96–112)
Creatinine, Ser: 0.68 mg/dL (ref 0.40–1.20)
GFR: 85.7 mL/min (ref >60.00)
Glucose, Bld: 93 mg/dL (ref 70–99)
Potassium: 4.1 meq/L (ref 3.5–5.1)
Sodium: 141 meq/L (ref 135–145)
Total Bilirubin: 0.6 mg/dL (ref 0.2–1.2)
Total Protein: 7.1 g/dL (ref 6.0–8.3)

## 2023-08-21 LAB — LIPID PANEL
Cholesterol: 205 mg/dL — ABNORMAL HIGH (ref 0–200)
HDL: 74.5 mg/dL (ref >39.00)
LDL Cholesterol: 119 mg/dL — ABNORMAL HIGH (ref 0–99)
NonHDL: 130.22
Total CHOL/HDL Ratio: 3
Triglycerides: 56 mg/dL (ref 0.0–149.0)
VLDL: 11.2 mg/dL (ref 0.0–40.0)

## 2023-08-21 LAB — SEDIMENTATION RATE: Sed Rate: 9 mm/h (ref 0–30)

## 2023-08-21 MED ORDER — LEVOCETIRIZINE DIHYDROCHLORIDE 5 MG PO TABS: 5.0000 mg | ORAL_TABLET | Freq: Every evening | ORAL | 0 refills | Status: AC

## 2023-08-21 NOTE — Assessment & Plan Note (Signed)
 Deteriorated recently.  Stop Zyrtec  10 mg.  Switch to Xyzal 5 mg.  Prescription sent to pharmacy.

## 2023-08-21 NOTE — Assessment & Plan Note (Signed)
 Controlled today and during prior visits.   Continue to monitor.

## 2023-08-21 NOTE — Assessment & Plan Note (Signed)
 Repeat lipid panel pending. Continue atorvastatin 40 mg daily.

## 2023-08-21 NOTE — Assessment & Plan Note (Signed)
 Following with endocrinology.  Continue annual infusion.  She will notify when she discovers the name of her current regimen.

## 2023-08-21 NOTE — Assessment & Plan Note (Signed)
 Evident on exam today to bilateral hands.  Discussed Voltaren gel. Recommended she schedule a visit with sports medicine for trigger finger injections.  Will rule out autoimmune arthritis given family history.  Labs pending today for CCP, RF, sed rate.

## 2023-08-21 NOTE — Patient Instructions (Signed)
 Stop by the lab prior to leaving today. I will notify you of your results once received.   Stop taking cetirizine  for allergies.  Start taking levocetirizine at bedtime for allergies.  It was a pleasure to see you today!

## 2023-08-21 NOTE — Assessment & Plan Note (Signed)
 Controlled.  Avoids triggers.

## 2023-08-21 NOTE — Assessment & Plan Note (Signed)
 Intermittent.  Overall manages well on her own.

## 2023-08-21 NOTE — Assessment & Plan Note (Signed)
 Stable. No concerns today.  Continue Tumeric. Continue to monitor.

## 2023-08-21 NOTE — Progress Notes (Signed)
 Subjective:    Patient ID: Denise Parker, female    DOB: [birth year], 73 y.o.   MRN: [id number]  HPI  Denise Parker is a very pleasant 73 y.o. female with a history of GERD, osteoporosis, hyperlipidemia, chronic back pain, heavy alcohol use, restless legs who presents today for follow-up of chronic conditions.  1) Hyperlipidemia: Currently managed on atorvastatin  40 mg daily and fish oil daily.  She is due for repeat lipid panel today.  2) Osteoporosis: Currently managed on an annual infusion , doesn't recall the name. No longer on Prolia . She is also taking calcium  and vitamin D .  Last bone density scan was in September 2024.  Follows with endocrinology.  3) Arthritis: Chronic. Her symptoms are mostly located to the right hand with inability to make a complete fist and fingers locking downward. She's also noticed swelling to most of the joints in the right hand.   She has the most difficulty when doing fine motor activities. She has a family history of rheumatoid arthritis.   Immunizations:  -Shingles: Completed Shingrix  serie.s -Pneumonia: Completed Pneumovax 23 in 2020, Prevnar 13 in 2015  Mammogram: Completed in September 2024 Bone Density Scan: Completed in September 2024  Colonoscopy: Completed in 2019, due 2029 if appropriate.   Review of Systems  HENT:  Positive for postnasal drip.   Respiratory:  Negative for shortness of breath.   Cardiovascular:  Negative for chest pain.  Gastrointestinal:  Negative for constipation and diarrhea.  Musculoskeletal:  Positive for arthralgias and joint swelling.  Skin:  Positive for color change.  Neurological:  Negative for dizziness and headaches.    BP Readings from Last 3 Encounters:  08/21/23 126/78  03/03/23 124/76  12/11/22 129/81         Past Medical History:  Diagnosis Date   Acute pain of right knee 07/24/2022   Arthritis    Bronchitis 01/30/2021   Bursitis    back   Chronic gastritis without  bleeding 05/30/2019   Cough in adult 01/30/2021   Herpes zoster 04/13/2013   HLD (hyperlipidemia)    HOH (hard of hearing)    Metatarsalgia 09/27/2012   Urinary frequency 11/21/2021    Social History   Socioeconomic History   Marital status: Married    Spouse name: Denise Parker   Number of children: 1   Years of education: IT sales professional   Highest education level: Master's degree (e.g., MA, MS, MEng, MEd, MSW, MBA)  Occupational History   Not on file  Tobacco Use   Smoking status: Never   Smokeless tobacco: Never  Vaping Use   Vaping status: Never Used  Substance and Sexual Activity   Alcohol use: Yes    Alcohol/week: 21.0 standard drinks of alcohol    Types: 21 Glasses of wine per week    Comment: Wine-regular   Drug use: No   Sexual activity: Yes    Birth control/protection: Surgical, Post-menopausal  Other Topics Concern   Not on file  Social History Narrative      Does not have a living will or HPOA   Desires CPR, would not want prolonged life support if futile.      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[employer]      Family: Son - Denise Parker, has 5 grandchildren (2 grown in [place] )  and 3 live here      Enjoys: reading, listen to books on tape, walk, hike, volunteering at the senior center      Exercise: walking - 5-7 times a week, 4 miles per day   Diet: pretty good, limits pork and beef, fish once a week      Safety   Seat belts: Yes    Guns: Yes  and secure   Safe in relationships: Yes       Social Drivers of Health   Financial Resource Strain: Low Risk  (08/20/2023)   Overall Financial Resource Strain (CARDIA)    Difficulty of Paying Living Expenses: Not hard at all  Food Insecurity: No Food Insecurity (08/20/2023)   Hunger Vital Sign    Worried About Running Out of Food in the Last Year: Never  true    Ran Out of Food in the Last Year: Never true  Transportation Needs: No Transportation Needs (08/20/2023)   PRAPARE - Administrator, Civil Service (Medical): No    Lack of Transportation (Non-Medical): No  Physical Activity: Sufficiently Active (08/20/2023)   Exercise Vital Sign    Days of Exercise per Week: 3 days    Minutes of Exercise per Session: 50 min  Stress: No Stress Concern Present (08/20/2023)   [employer] of Occupational Health - Occupational Stress Questionnaire    Feeling of Stress: Only a little  Social Connections: Unknown (08/20/2023)   Social Connection and Isolation Panel    Frequency of Communication with Friends and Family: Once a week    Frequency of Social Gatherings with Friends and Family: Twice a week    Attends Religious Services: 1 to 4 times per year    Active Member of [employer] or Organizations: Not on file    Attends Banker Meetings: Not on file    Marital Status: Married  Intimate Partner Violence: Not At Risk (08/20/2022)   Humiliation, Afraid, Rape, and Kick questionnaire    Fear of Current or Ex-Partner: No    Emotionally Abused: No    Physically Abused: No    Sexually Abused: No    Past Surgical History:  Procedure Laterality Date   BREAST CYST ASPIRATION Left 90s   benign   CATARACT EXTRACTION W/PHACO Left 03/26/2017   Procedure: CATARACT EXTRACTION PHACO AND INTRAOCULAR LENS PLACEMENT (IOC);  Surgeon: Myrna Adine Anes, MD;  Location: [facility] ORS;  Service: Ophthalmology;  Laterality: Left;   fluid pack lot #   [id number] H US     00:23.1 AP%  7.5 CDE   1.71    CATARACT EXTRACTION W/PHACO Right 07/30/2017   Procedure: CATARACT EXTRACTION PHACO AND INTRAOCULAR LENS PLACEMENT (IOC);  Surgeon: Myrna Adine Anes, MD;  Location: [facility] ORS;  Service: Ophthalmology;  Laterality: Right;  fluid pack lot #  [id number] H  Exp  03/26/2018 US    00:32.1 AP%   5.9 CDE   1.81   COLONOSCOPY WITH PROPOFOL  N/A 09/11/2017   Procedure:  COLONOSCOPY WITH PROPOFOL ;  Surgeon: Janalyn Keene NOVAK, MD;  Location: [facility] END[facility];  Service: Gastroenterology;  Laterality: N/A;   TONSILLECTOMY     TUBAL LIGATION      Family History  Problem Relation Age of Onset   Heart disease Mother    Stroke Mother    Heart disease Father    Lupus Father    Breast cancer Sister 14   Leukemia Sister    Colon cancer Sister 84   Lupus Sister    Breast cancer Sister  Lupus Sister    Heart attack Brother 23    Allergies  Allergen Reactions   Probiotic Product Itching and Swelling    Can't remember the name, it was a women's probiotic supplement. It cause burning, itching and swelling of the ears    Current Outpatient Medications on File Prior to Visit  Medication Sig Dispense Refill   atorvastatin  (LIPITOR) 40 MG tablet TAKE 1 TABLET(40 MG) BY MOUTH DAILY FOR CHOLESTEROL 90 tablet 1   Calcium  Carbonate-Vit D-Min (CALTRATE 600+D PLUS MINERALS) 600-800 MG-UNIT TABS Take 2 tablets by mouth.     cetirizine  (ZYRTEC ) 10 MG tablet Take 1 tablet (10 mg total) by mouth daily as needed for allergies. 90 tablet 1   COLLAGEN PO Take by mouth.     fluorometholone (FML) 0.1 % ophthalmic suspension Place 1 drop into both eyes daily.     fluticasone  (FLONASE ) 50 MCG/ACT nasal spray Place 2 sprays into both nostrils daily. As needed for allergies or rhinitis 48 g 0   MELATONIN PO Take by mouth.     Menthol, Topical Analgesic, (BIOFREEZE EX) Apply 1 application topically daily as needed (bursitis in the hip and back pain).     Omega-3 Fatty Acids (OMEGA 3 500 PO) Take 2 capsules by mouth daily at 6 (six) AM.     Turmeric 500 MG TABS Take 500 mg by mouth 3 (three) times daily.     vitamin E 400 UNIT capsule Take 400 Units by mouth daily.     CRANBERRY-VITAMIN C PO Take 1 tablet by mouth daily.  (Patient not taking: Reported on 08/21/2023)     GLUCOSAMINE CHONDROITIN COMPLX PO Take 1 tablet by mouth daily.  (Patient not taking: Reported on 08/21/2023)      triamcinolone  (KENALOG ) 0.1 % paste Use as directed 1 Application in the mouth or throat 2 (two) times daily. (Patient not taking: Reported on 08/21/2023) 5 g 0   No current facility-administered medications on file prior to visit.    BP 126/78   Pulse (!) 58   Temp (!) 97.4 F (36.3 C) (Temporal)   Ht 5' 3.5 (1.613 m)   Wt 198 lb (89.8 kg)   SpO2 97%   BMI 34.52 kg/m  Objective:   Physical Exam  Cardiovascular:     Rate and Rhythm: Normal rate and regular rhythm.  Pulmonary:     Effort: Pulmonary effort is normal.     Breath sounds: Normal breath sounds.   Musculoskeletal:     Cervical back: Neck supple.     Comments: Joint swelling noted to DIP and PIP joints to all fingers of right hand. Decrease in ROM to right hand with flexion. Unable to make a complete fist.   Skin:    General: Skin is warm and dry.   Neurological:     Mental Status: She is alert and oriented to person, place, and time.   Psychiatric:        Mood and Affect: Mood normal.           Assessment & Plan:  Gastroesophageal reflux disease, unspecified whether esophagitis present Assessment & Plan: Controlled.  Avoids triggers.    Chronic right-sided low back pain without sciatica Assessment & Plan: Stable. No concerns today.  Continue Tumeric. Continue to monitor.    Restless legs Assessment & Plan: Intermittent.  Overall manages well on her own.   Elevated blood pressure reading in office without diagnosis of hypertension Assessment & Plan: Controlled today and during prior visits.  Continue to monitor.    Hyperlipidemia, unspecified hyperlipidemia type Assessment & Plan: Repeat lipid panel pending. Continue atorvastatin  40 mg daily.  Orders: -     CBC -     Lipid panel -     Comprehensive metabolic panel with GFR  Arthritis Assessment & Plan: Evident on exam today to bilateral hands.  Discussed Voltaren gel. Recommended she schedule a visit with sports  medicine for trigger finger injections.  Will rule out autoimmune arthritis given family history.  Labs pending today for CCP, RF, sed rate.  Orders: -     Sedimentation rate -     Rheumatoid factor -     Cyclic citrul peptide antibody, IgG  Osteoporosis, unspecified osteoporosis type, unspecified pathological fracture presence Assessment & Plan: Following with endocrinology.  Continue annual infusion.  She will notify when she discovers the name of her current regimen.   Allergic rhinitis with postnasal drip Assessment & Plan: Deteriorated recently.  Stop Zyrtec  10 mg.  Switch to Xyzal 5 mg.  Prescription sent to pharmacy.  Orders: -     Levocetirizine Dihydrochloride; Take 1 tablet (5 mg total) by mouth every evening. For allergies  Dispense: 90 tablet; Refill: 0        Comer MARLA Gaskins, NP

## 2023-08-24 ENCOUNTER — Ambulatory Visit: Payer: Self-pay | Admitting: Primary Care

## 2023-08-24 LAB — CYCLIC CITRUL PEPTIDE ANTIBODY, IGG: Cyclic Citrullin Peptide Ab: <16 U

## 2023-08-24 LAB — RHEUMATOID FACTOR: Rheumatoid fact SerPl-aCnc: <10 [IU]/mL (ref <14)

## 2023-09-02 ENCOUNTER — Ambulatory Visit

## 2023-09-02 VITALS — Ht 63.5 in | Wt 198.0 lb

## 2023-09-02 DIAGNOSIS — Z0000 Encounter for general adult medical examination without abnormal findings: Secondary | ICD-10-CM | POA: Diagnosis not present

## 2023-09-02 NOTE — Progress Notes (Signed)
 Subjective:   Denise Parker is a 73 y.o. who presents for a Medicare Wellness preventive visit.  As a reminder, Annual Wellness Visits don't include a physical exam, and some assessments may be limited, especially if this visit is performed virtually. We may recommend an in-person follow-up visit with your provider if needed.  Visit Complete: Virtual I connected with  Avelina Jenkins Search on 09/02/23 by a audio enabled telemedicine application and verified that I am speaking with the correct person using two identifiers.  Patient Location: Home  Provider Location: Home Office  I discussed the limitations of evaluation and management by telemedicine. The patient expressed understanding and agreed to proceed.  Vital Signs: Because this visit was a virtual/telehealth visit, some criteria may be missing or patient reported. Any vitals not documented were not able to be obtained and vitals that have been documented are patient reported.  VideoDeclined- This patient declined Librarian, academic. Therefore the visit was completed with audio only.  Persons Participating in Visit: Patient.  AWV Questionnaire: Yes: Patient Medicare AWV questionnaire was completed by the patient on 09/01/23; I have confirmed that all information answered by patient is correct and no changes since this date.  Cardiac Risk Factors include: advanced age (>18men, >31 women);hypertension;obesity (BMI >30kg/m2)     Objective:    Today's Vitals   09/02/23 1304  Weight: 198 lb (89.8 kg)  Height: 5' 3.5 (1.613 m)   Body mass index is 34.52 kg/m.     09/02/2023    1:16 PM 08/20/2022    3:08 PM 07/29/2022   11:25 AM 06/10/2021    9:26 AM 06/06/2020    9:04 AM 09/11/2017    8:30 AM 07/30/2017   10:30 AM  Advanced Directives  Does Patient Have a Medical Advance Directive? Yes Yes No No No No  No   Type of Estate agent of [place];Living will Healthcare Power of  [place];Living will       Does patient want to make changes to medical advance directive?  No - Patient declined       Copy of Healthcare Power of Attorney in Chart? Yes - validated most recent copy scanned in chart (See row information) Yes - validated most recent copy scanned in chart (See row information)       Would patient like information on creating a medical advance directive?   No - Patient declined Yes (MAU/Ambulatory/Procedural Areas - Information given) Yes (MAU/Ambulatory/Procedural Areas - Information given) No - Patient declined       Data saved with a previous flowsheet row definition    Current Medications (verified) Outpatient Encounter Medications as of 09/02/2023  Medication Sig   atorvastatin  (LIPITOR) 40 MG tablet TAKE 1 TABLET(40 MG) BY MOUTH DAILY FOR CHOLESTEROL   Calcium  Carbonate-Vit D-Min (CALTRATE 600+D PLUS MINERALS) 600-800 MG-UNIT TABS Take 2 tablets by mouth.   cetirizine  (ZYRTEC ) 10 MG tablet Take 1 tablet (10 mg total) by mouth daily as needed for allergies.   COLLAGEN PO Take by mouth.   CRANBERRY-VITAMIN C PO Take 1 tablet by mouth daily.    fluorometholone (FML) 0.1 % ophthalmic suspension Place 1 drop into both eyes daily.   fluticasone  (FLONASE ) 50 MCG/ACT nasal spray Place 2 sprays into both nostrils daily. As needed for allergies or rhinitis   levocetirizine (XYZAL ) 5 MG tablet Take 1 tablet (5 mg total) by mouth every evening. For allergies   MELATONIN PO Take by mouth.   Menthol, Topical Analgesic, (  BIOFREEZE EX) Apply 1 application topically daily as needed (bursitis in the hip and back pain).   Omega-3 Fatty Acids (OMEGA 3 500 PO) Take 2 capsules by mouth daily at 6 (six) AM.   Turmeric 500 MG TABS Take 500 mg by mouth 3 (three) times daily.   vitamin E 400 UNIT capsule Take 400 Units by mouth daily.   GLUCOSAMINE CHONDROITIN COMPLX PO Take 1 tablet by mouth daily.  (Patient not taking: Reported on 08/21/2023)   triamcinolone  (KENALOG ) 0.1 % paste  Use as directed 1 Application in the mouth or throat 2 (two) times daily. (Patient not taking: Reported on 08/21/2023)   No facility-administered encounter medications on file as of 09/02/2023.    Allergies (verified) Probiotic product   History: Past Medical History:  Diagnosis Date   Acute pain of right knee 07/24/2022   Arthritis    Bronchitis 01/30/2021   Bursitis    back   Chronic gastritis without bleeding 05/30/2019   Cough in adult 01/30/2021   Herpes zoster 04/13/2013   HLD (hyperlipidemia)    HOH (hard of hearing)    Metatarsalgia 09/27/2012   Urinary frequency 11/21/2021   Past Surgical History:  Procedure Laterality Date   BREAST CYST ASPIRATION Left 90s   benign   CATARACT EXTRACTION W/PHACO Left 03/26/2017   Procedure: CATARACT EXTRACTION PHACO AND INTRAOCULAR LENS PLACEMENT (IOC);  Surgeon: Myrna Adine Anes, MD;  Location: [facility] ORS;  Service: Ophthalmology;  Laterality: Left;   fluid pack lot #   [id number] US     00:23.1 AP%  7.5 CDE   1.71    CATARACT EXTRACTION W/PHACO Right 07/30/2017   Procedure: CATARACT EXTRACTION PHACO AND INTRAOCULAR LENS PLACEMENT (IOC);  Surgeon: Myrna Adine Anes, MD;  Location: [facility] ORS;  Service: Ophthalmology;  Laterality: Right;  fluid pack lot #  [id number]  Exp  03/26/2018 US    00:32.1 AP%   5.9 CDE   1.81   COLONOSCOPY WITH PROPOFOL  N/A 09/11/2017   Procedure: COLONOSCOPY WITH PROPOFOL ;  Surgeon: Janalyn Keene NOVAK, MD;  Location: [facility];  Service: Gastroenterology;  Laterality: N/A;   TONSILLECTOMY     TUBAL LIGATION     Family History  Problem Relation Age of Onset   Heart disease Mother    Stroke Mother    Heart disease Father    Lupus Father    Breast cancer Sister 78   Leukemia Sister    Colon cancer Sister 82   Lupus Sister    Breast cancer Sister    Lupus Sister    Heart attack Brother 23   Social History   Socioeconomic History   Marital status: Married    Spouse name: Alm   Number of children:  1   Years of education: IT sales professional   Highest education level: Master's degree (e.g., MA, MS, MEng, MEd, MSW, MBA)  Occupational History   Not on file  Tobacco Use   Smoking status: Never   Smokeless tobacco: Never  Vaping Use   Vaping status: Never Used  Substance and Sexual Activity   Alcohol use: Yes    Alcohol/week: 21.0 standard drinks of alcohol    Types: 21 Glasses of wine per week    Comment: Wine-regular   Drug use: No   Sexual activity: Yes    Birth control/protection: Surgical, Post-menopausal  Other Topics Concern   Not on file  Social History Narrative      Does not have a living will or HPOA   Desires CPR,  would not want prolonged life support if futile.      05/30/19   From: originally from this area, but spent time in [place]    Living: with husband Alm, and son and his family live upstairs   Work: retired from [employer], also use to teach [employer]      Family: Son - Alm, has 5 grandchildren (2 grown in [place] ) and 3 live here      Enjoys: reading, listen to books on tape, walk, hike, volunteering at the senior center      Exercise: walking - 5-7 times a week, 4 miles per day   Diet: pretty good, limits pork and beef, fish once a week      Safety   Seat belts: Yes    Guns: Yes  and secure   Safe in relationships: Yes       Social Drivers of Corporate investment banker Strain: Low Risk  (09/02/2023)   Overall Financial Resource Strain (CARDIA)    Difficulty of Paying Living Expenses: Not hard at all  Food Insecurity: No Food Insecurity (09/02/2023)   Hunger Vital Sign    Worried About Running Out of Food in the Last Year: Never true    Ran Out of Food in the Last Year: Never true  Transportation Needs: No Transportation Needs (09/02/2023)   PRAPARE - Administrator, Civil Service (Medical): No    Lack of Transportation (Non-Medical): No  Physical Activity: Sufficiently Active (09/02/2023)   Exercise Vital Sign    Days  of Exercise per Week: 4 days    Minutes of Exercise per Session: 40 min  Stress: No Stress Concern Present (09/02/2023)   [employer] of Occupational Health - Occupational Stress Questionnaire    Feeling of Stress: Not at all  Social Connections: Socially Integrated (09/02/2023)   Social Connection and Isolation Panel    Frequency of Communication with Friends and Family: Once a week    Frequency of Social Gatherings with Friends and Family: Twice a week    Attends Religious Services: 1 to 4 times per year    Active Member of [employer] or Organizations: Yes    Attends Engineer, structural: More than 4 times per year    Marital Status: Married    Tobacco Counseling Counseling given: Not Answered    Clinical Intake:  Pre-visit preparation completed: Yes  Pain : No/denies pain     BMI - recorded: 34.52 Nutritional Status: BMI > 30  Obese Nutritional Risks: None Diabetes: No  No results found for: HGBA1C   How often do you need to have someone help you when you read instructions, pamphlets, or other written materials from your doctor or pharmacy?: 1 - Never  Interpreter Needed?: No      Activities of Daily Living     09/01/2023    7:03 PM  In your present state of health, do you have any difficulty performing the following activities:  Hearing? 1  Vision? 0  Difficulty concentrating or making decisions? 0  Walking or climbing stairs? 0  Dressing or bathing? 0  Doing errands, shopping? 0  Preparing Food and eating ? N  Using the Toilet? N  In the past six months, have you accidently leaked urine? N  Do you have problems with loss of bowel control? N  Managing your Medications? N  Managing your Finances? N  Housekeeping or managing your Housekeeping? N    Patient Care Team: Gretta Crank  K, NP as PCP - General (Internal Medicine) Betsy Dunnings, MD as Consulting Physician (Unknown Physician Specialty)  I have updated your Care Teams any recent  Medical Services you may have received from other providers in the past year.     Assessment:   This is a routine wellness examination for Marjani.  Hearing/Vision screen No results found.   Goals Addressed             This Visit's Progress    DIET - EAT MORE FRUITS AND VEGETABLES   On track    09/02/23-Would like to get to 70% plant based diet     COMPLETED: Increase physical activity   On track    Starting 07/07/2016, I will continue to exercise for at least 45 min 3-4 days per week.       Patient Stated   Not on track    09/02/23-Lose 60 pounds.     COMPLETED: Weight (lb) < 146 lb (66.2 kg)   198 lb (89.8 kg)      Depression Screen     09/02/2023    1:12 PM 08/21/2023   10:39 AM 08/20/2022    3:07 PM 07/30/2022   10:03 AM 07/24/2022   10:54 AM 06/24/2022    1:53 PM 11/21/2021   11:02 AM  PHQ 2/9 Scores  PHQ - 2 Score 0 0 0 0 0 0 0  PHQ- 9 Score   0 1 1  0    Fall Risk     09/01/2023    7:03 PM 08/21/2023   10:39 AM 03/03/2023    2:58 PM 08/20/2022    3:09 PM 08/20/2022    7:39 AM  Fall Risk   Falls in the past year? 0 0 0 0 0  Number falls in past yr: 0 0 0 0   Injury with Fall? 0 0 0 0   Risk for fall due to : No Fall Risks No Fall Risks No Fall Risks No Fall Risks   Follow up Education provided;Falls prevention discussed Falls evaluation completed Falls evaluation completed Falls prevention discussed;Falls evaluation completed     MEDICARE RISK AT HOME:  Medicare Risk at Home Any stairs in or around the home?: (Patient-Rptd) Yes If so, are there any without handrails?: (Patient-Rptd) No Home free of loose throw rugs in walkways, pet beds, electrical cords, etc?: (Patient-Rptd) Yes Adequate lighting in your home to reduce risk of falls?: (Patient-Rptd) Yes Life alert?: (Patient-Rptd) No Use of a cane, walker or w/c?: (Patient-Rptd) No Grab bars in the bathroom?: (Patient-Rptd) Yes Shower chair or bench in shower?: (Patient-Rptd) No Elevated toilet seat or a  handicapped toilet?: (Patient-Rptd) Yes  TIMED UP AND GO:  Was the test performed?  No  Cognitive Function: 6CIT completed    07/07/2016   10:04 AM 06/13/2015   11:55 AM  MMSE - Mini Mental State Exam  Orientation to time 5  5   Orientation to Place 5  5   Registration 3  3   Attention/ Calculation 0  0   Recall 3  3   Language- name 2 objects 0  0   Language- repeat 1 1  Language- follow 3 step command 3  3   Language- read & follow direction 0  0   Write a sentence 0  0   Copy design 0  0   Total score 20  20      Data saved with a previous flowsheet row definition  09/02/2023    1:16 PM 08/20/2022    3:14 PM  6CIT Screen  What Year? 0 points 0 points  What month? 0 points 0 points  What time? 0 points 0 points  Count back from 20 0 points 0 points  Months in reverse 0 points 0 points  Repeat phrase 0 points 0 points  Total Score 0 points 0 points    Immunizations Immunization History  Administered Date(s) Administered   Fluad Quad(high Dose 65+) 12/02/2018, 11/30/2019, 12/21/2020, 11/14/2021   Influenza, High Dose Seasonal PF 03/24/2018   Influenza,inj,Quad PF,6+ Mos 02/08/2016, 03/03/2017   Influenza-Unspecified 12/08/2013, 11/04/2022   PFIZER Comirnaty(Gray Top)Covid-19 Tri-Sucrose Vaccine 12/04/2021   PFIZER(Purple Top)SARS-COV-2 Vaccination 04/10/2019, 05/10/2019, 12/07/2019, 06/15/2020   Pfizer Covid-19 Vaccine Bivalent Booster 105yrs & up 12/19/2020   Pneumococcal Conjugate-13 01/24/2014   Pneumococcal Polysaccharide-23 02/01/2019   Pneumococcal-Unspecified 11/23/2012   Zoster Recombinant(Shingrix ) 07/28/2017, 09/29/2017   Zoster, Live 06/16/2011    Screening Tests Health Maintenance  Topic Date Due   COVID-19 Vaccine (7 - 2024-25 season) 10/26/2022   DTaP/Tdap/Td (1 - Tdap) 03/02/2024 (Originally 01/22/1970)   INFLUENZA VACCINE  09/25/2023   MAMMOGRAM  10/28/2023   Medicare Annual Wellness (AWV)  09/01/2024   Colonoscopy  09/12/2027    Pneumococcal Vaccine: 50+ Years  Completed   DEXA SCAN  Completed   Hepatitis C Screening  Completed   Zoster Vaccines- Shingrix   Completed   Hepatitis B Vaccines  Aged Out   HPV VACCINES  Aged Out   Meningococcal B Vaccine  Aged Out   Fecal DNA (Cologuard)  Discontinued    Health Maintenance  Health Maintenance Due  Topic Date Due   COVID-19 Vaccine (7 - 2024-25 season) 10/26/2022   Health Maintenance Items Addressed: None at this time  Additional Screening:  Vision Screening: Recommended annual ophthalmology exams for early detection of glaucoma and other disorders of the eye. Would you like a referral to an eye doctor? No    Dental Screening: Recommended annual dental exams for proper oral hygiene  Community Resource Referral / Chronic Care Management: CRR required this visit?  No   CCM required this visit?  No   Plan:    I have personally reviewed and noted the following in the patient's chart:   Medical and social history Use of alcohol, tobacco or illicit drugs  Current medications and supplements including opioid prescriptions. Patient is not currently taking opioid prescriptions. Functional ability and status Nutritional status Physical activity Advanced directives List of other physicians Hospitalizations, surgeries, and ER visits in previous 12 months Vitals Screenings to include cognitive, depression, and falls Referrals and appointments  In addition, I have reviewed and discussed with patient certain preventive protocols, quality metrics, and best practice recommendations. A written personalized care plan for preventive services as well as general preventive health recommendations were provided to patient.   Erminio LITTIE Saris, LPN   [id number]/7974   After Visit Summary: (MyChart) Due to this being a telephonic visit, the after visit summary with patients personalized plan was offered to patient via MyChart   Notes: Please refer to Routing Comments.

## 2023-09-02 NOTE — Patient Instructions (Signed)
 Ms. Gunnoe , Thank you for taking time out of your busy schedule to complete your Annual Wellness Visit with me. I enjoyed our conversation and look forward to speaking with you again next year. I, as well as your care team,  appreciate your ongoing commitment to your health goals. Please review the following plan we discussed and let me know if I can assist you in the future. Your Game plan/ To Do List    Follow up Visits: Next Medicare AWV with our clinical staff: 09/02/24  @1pm  televisit Have you seen your provider in the last 6 months (3 months if uncontrolled diabetes)? Yes Next Office Visit with your provider: not scheduled yet.due 6/26 for physical  Clinician Recommendations:  Aim for 30 minutes of exercise or brisk walking, 6-8 glasses of water, and 5 servings of fruits and vegetables each day.       This is a list of the screening recommended for you and due dates:  Health Maintenance  Topic Date Due   COVID-19 Vaccine (7 - 2024-25 season) 10/26/2022   DTaP/Tdap/Td vaccine (1 - Tdap) 03/02/2024*   Flu Shot  09/25/2023   Mammogram  10/28/2023   Medicare Annual Wellness Visit  09/01/2024   Colon Cancer Screening  09/12/2027   Pneumococcal Vaccine for age over 30  Completed   DEXA scan (bone density measurement)  Completed   Hepatitis C Screening  Completed   Zoster (Shingles) Vaccine  Completed   Hepatitis B Vaccine  Aged Out   HPV Vaccine  Aged Out   Meningitis B Vaccine  Aged Out   Cologuard (Stool DNA test)  Discontinued  *Topic was postponed. The date shown is not the original due date.    Advanced directives: (In Chart) A copy of your advanced directives are scanned into your chart should your provider ever need it. Advance Care Planning is important because it:  [x]  Makes sure you receive the medical care that is consistent with your values, goals, and preferences  [x]  It provides guidance to your family and loved ones and reduces their decisional burden about whether  or not they are making the right decisions based on your wishes.  Follow the link provided in your after visit summary or read over the paperwork we have mailed to you to help you started getting your Advance Directives in place. If you need assistance in completing these, please reach out to us  so that we can help you!

## 2023-09-17 ENCOUNTER — Other Ambulatory Visit: Payer: Self-pay

## 2023-09-17 DIAGNOSIS — J309 Allergic rhinitis, unspecified: Secondary | ICD-10-CM

## 2023-09-17 MED ORDER — FLUTICASONE PROPIONATE 50 MCG/ACT NA SUSP: 2.0000 | Freq: Every day | NASAL | 0 refills | Status: AC

## 2023-09-18 ENCOUNTER — Other Ambulatory Visit: Payer: Self-pay | Admitting: Primary Care

## 2023-09-18 DIAGNOSIS — Z1231 Encounter for screening mammogram for malignant neoplasm of breast: Secondary | ICD-10-CM

## 2023-10-08 DIAGNOSIS — H43813 Vitreous degeneration, bilateral: Secondary | ICD-10-CM | POA: Diagnosis not present

## 2023-10-08 DIAGNOSIS — H04123 Dry eye syndrome of bilateral lacrimal glands: Secondary | ICD-10-CM | POA: Diagnosis not present

## 2023-10-08 DIAGNOSIS — Z961 Presence of intraocular lens: Secondary | ICD-10-CM | POA: Diagnosis not present

## 2023-10-08 DIAGNOSIS — H0289 Other specified disorders of eyelid: Secondary | ICD-10-CM | POA: Diagnosis not present

## 2023-11-02 ENCOUNTER — Other Ambulatory Visit: Payer: Self-pay | Admitting: Primary Care

## 2023-11-02 DIAGNOSIS — E785 Hyperlipidemia, unspecified: Secondary | ICD-10-CM

## 2023-11-05 ENCOUNTER — Ambulatory Visit
Admission: RE | Admit: 2023-11-05 | Discharge: 2023-11-05 | Disposition: A | Discharge status: Home / Self Care | Source: Ambulatory Visit | Attending: Primary Care | Admitting: Primary Care

## 2023-11-05 DIAGNOSIS — Z1231 Encounter for screening mammogram for malignant neoplasm of breast: Secondary | ICD-10-CM | POA: Diagnosis present

## 2023-11-09 ENCOUNTER — Ambulatory Visit: Payer: Self-pay | Admitting: Primary Care

## 2023-11-16 DIAGNOSIS — E785 Hyperlipidemia, unspecified: Secondary | ICD-10-CM

## 2023-11-16 MED ORDER — ATORVASTATIN CALCIUM 40 MG PO TABS: 40.0000 mg | ORAL_TABLET | Freq: Every day | ORAL | 2 refills | Status: AC

## 2023-11-23 NOTE — Progress Notes (Signed)
 "    Denise Parker [name], MD, CAQ Sports Medicine [facility] at [facility] [address] [place], [address]  Phone: [phone]  FAX: [phone]  Denise Parker - 73 y.o. female  MRN [id number]  Date of Birth: 09/13/1950  Date: 11/25/2023  PCP: Gretta Comer POUR, NP  Referral: Gretta Comer POUR, NP  Chief Complaint  Patient presents with   Knee Pain    Right   Subjective:   Denise Parker is a 73 y.o. very pleasant female patient with Body mass index is 35.07 kg/m. who presents with the following:  Discussed the use of AI scribe software for clinical note transcription with the patient, who gave verbal consent to proceed.  Patient presents for evaluation of ongoing acute knee pain.  I saw her for some right knee pain a little bit over a year ago in 2024.  Felt as if she was having some acute on chronic knee pain with arthritic component and did do an intra-articular injection at that point.   History of Present Illness Denise Parker is a 73 year old female with osteoarthritis who presents with right knee pain exacerbation.  Her right knee pain worsened after she increased her walking distance from two and a half miles to five miles without a gradual ramp-up approximately ten days ago. The knee was already somewhat inflamed from a previous cardio aerobics class, which she quit five weeks ago due to its intensity.  Following the five-mile walk, she attempted to continue with her usual two and a half mile walks but could not complete them due to increased pain. The last walk she attempted was last Friday, after which she experienced severe pain that kept her awake all night. The pain is particularly intense when lying on her right side, forcing her to sleep on her back with her knee propped up by a pillow.  No prior surgery history in the affected knee and she does not have any locking up of the joint.  No mechanical  symptoms.  She has a history of osteoarthritis, with previous x-rays in June 2024 and a knee injection in May 2024, which provided relief at that time. She has been using a TENS unit and topical Voltaren gel for pain management, which provides temporary relief. She has also taken Aleve four to five times in the past two weeks, which helps with the pain but causes stomach discomfort and nausea.  She used to walk and run marathons, with the last one being in 2012. She recently moved to a new area with steep inclines, which contributed to the exacerbation of her knee pain. She participates in deep water aerobics three times a week.    Review of Systems is noted in the HPI, as appropriate  Objective:   BP 128/68   Pulse (!) 57   Temp 98 F (36.7 C) (Temporal)   Ht 5' 3.5 (1.613 m)   Wt 201 lb 2 oz (91.2 kg)   SpO2 95%   BMI 35.07 kg/m   GEN: No acute distress; alert,appropriate. PULM: Breathing comfortably in no respiratory distress PSYCH: Normally interactive.   Right knee: Full extension and flexion to 105 Minimal patellar motion Significant pain on the medial and lateral joint lines Significant pain at the anserine bursa No pain at the tibial tubercle tibial plateau or fibular head Stable to varus and valgus stress ACL PCL are intact Any kind of forced lection, McMurray's, flexion pinch induces pain and symptoms  Laboratory and Imaging Data: CLINICAL DATA:  Right knee pain   EXAM: RIGHT KNEE - COMPLETE 4+ VIEW   COMPARISON:  None Available.   FINDINGS: Minimal enthesopathic changes off the superior patella. Lateral tilting of the patella on the sunrise view. Minimal degenerative changes in the medial compartment without loss of joint space. Mild degenerative changes in the patellofemoral compartment. No other abnormalities.   IMPRESSION: 1. Mild degenerative changes in the patellofemoral compartment. Minimal degenerative changes in the medial compartment without  loss of joint space. 2. Lateral tilting of the patella on the sunrise view. 3. Minimal enthesopathic changes off the superior patella.     Electronically Signed   By: Alm Pouch III M.D.   On: 07/25/2022 13:00  Assessment and Plan:     ICD-10-CM   1. Primary osteoarthritis of right knee  M17.11      Assessment & Plan Acute on chronic right knee osteoarthritis with pes anserine bursitis and pain Exacerbation due to increased physical activity. Aleve causes stomach discomfort; topical Voltaren gel provides temporary relief. - Prescribed Celebrex  for pain and inflammation management. - Administered knee injection with lidocaine  and Kenalog . - Advised cross-training in the pool for ten days. - Recommended starting with short walking distances and gradual increase. - Encouraged water aerobics five days a week.  Aspiration/Injection Procedure Note Numa Heatwole [birth year] Date of procedure: 11/25/2023  Procedure: Large Joint Aspiration / Injection of Knee< R Indications: Pain  Procedure Details Patient verbally consented to procedure. Risks, benefits, and alternatives explained. Sterilely prepped with Chloraprep. Ethyl cholride used for anesthesia. 9 cc Lidocaine  1% mixed with 1 mL of Kenalog  40 mg injected using the anteromedial approach without difficulty. No complications with procedure and tolerated well. Patient had decreased pain post-injection. Medication: 1 mL of Kenalog  40 mg   Medication Management during today's office visit: Meds ordered this encounter  Medications   celecoxib  (CELEBREX ) 200 MG capsule    Sig: Take 1 capsule (200 mg total) by mouth daily.    Dispense:  30 capsule    Refill:  2   Medications Discontinued During This Encounter  Medication Reason   GLUCOSAMINE CHONDROITIN COMPLX PO Completed Course   triamcinolone  (KENALOG ) 0.1 % paste Completed Course   GLUCOSAMINE CHONDROITIN COMPLX PO Completed Course   triamcinolone  (KENALOG ) 0.1 %  paste Completed Course    Orders placed today for conditions managed today: No orders of the defined types were placed in this encounter.   Disposition: No follow-ups on file.  [employer] speech-to-text software was used for transcription in this dictation.  Possible transcriptional errors can occur using Animal nutritionist.   Signed,  Jacques DASEN. Pablo Mathurin, MD   Outpatient Encounter Medications as of 11/25/2023  Medication Sig   atorvastatin  (LIPITOR) 40 MG tablet Take 1 tablet (40 mg total) by mouth daily. for cholesterol.   Calcium  Carbonate-Vit D-Min (CALTRATE 600+D PLUS MINERALS) 600-800 MG-UNIT TABS Take 2 tablets by mouth.   celecoxib  (CELEBREX ) 200 MG capsule Take 1 capsule (200 mg total) by mouth daily.   cetirizine  (ZYRTEC ) 10 MG tablet Take 1 tablet (10 mg total) by mouth daily as needed for allergies.   COLLAGEN PO Take by mouth.   CRANBERRY-VITAMIN C PO Take 1 tablet by mouth daily.    fluorometholone (FML) 0.1 % ophthalmic suspension Place 1 drop into both eyes daily.   fluticasone  (FLONASE ) 50 MCG/ACT nasal spray Place 2 sprays into both nostrils daily. As needed for allergies or rhinitis   levocetirizine (XYZAL )  5 MG tablet Take 1 tablet (5 mg total) by mouth every evening. For allergies   MELATONIN PO Take by mouth.   Menthol, Topical Analgesic, (BIOFREEZE EX) Apply 1 application topically daily as needed (bursitis in the hip and back pain).   Omega-3 Fatty Acids (OMEGA 3 500 PO) Take 2 capsules by mouth daily at 6 (six) AM.   Turmeric 500 MG TABS Take 500 mg by mouth 3 (three) times daily.   vitamin E 400 UNIT capsule Take 400 Units by mouth daily.   [DISCONTINUED] GLUCOSAMINE CHONDROITIN COMPLX PO Take 1 tablet by mouth daily.  (Patient not taking: Reported on 08/21/2023)   [DISCONTINUED] triamcinolone  (KENALOG ) 0.1 % paste Use as directed 1 Application in the mouth or throat 2 (two) times daily. (Patient not taking: Reported on 08/21/2023)   No  facility-administered encounter medications on file as of 11/25/2023.   "

## 2023-11-25 ENCOUNTER — Ambulatory Visit: Admitting: Family Medicine

## 2023-11-25 ENCOUNTER — Encounter: Payer: Self-pay | Admitting: Family Medicine

## 2023-11-25 VITALS — BP 128/68 | HR 57 | Temp 98.0°F | Ht 63.5 in | Wt 201.1 lb

## 2023-11-25 DIAGNOSIS — G8929 Other chronic pain: Secondary | ICD-10-CM

## 2023-11-25 DIAGNOSIS — M1711 Unilateral primary osteoarthritis, right knee: Secondary | ICD-10-CM | POA: Diagnosis not present

## 2023-11-25 MED ORDER — CELECOXIB 200 MG PO CAPS: 200.0000 mg | ORAL_CAPSULE | Freq: Every day | ORAL | 2 refills | Status: DC

## 2023-11-25 MED ORDER — TRIAMCINOLONE ACETONIDE 40 MG/ML IJ SUSP
40.0000 mg | Freq: Once | INTRAMUSCULAR | Status: AC
  Administered 2023-11-25: 40 mg via INTRA_ARTICULAR

## 2023-12-03 ENCOUNTER — Telehealth: Payer: Self-pay

## 2023-12-03 ENCOUNTER — Ambulatory Visit (INDEPENDENT_AMBULATORY_CARE_PROVIDER_SITE_OTHER): Payer: 59 | Admitting: Internal Medicine

## 2023-12-03 ENCOUNTER — Encounter: Payer: Self-pay | Admitting: Internal Medicine

## 2023-12-03 VITALS — BP 120/70 | HR 64 | Ht 63.5 in | Wt 198.0 lb

## 2023-12-03 DIAGNOSIS — M810 Age-related osteoporosis without current pathological fracture: Secondary | ICD-10-CM | POA: Diagnosis not present

## 2023-12-03 NOTE — Patient Instructions (Addendum)
 Calcium  600 mg twice daily  Weightbearing exercises 2-3 times a week

## 2023-12-03 NOTE — Progress Notes (Unsigned)
 Name: Denise Parker  MRN/ DOB: [id number], 1950-08-11    Age/ Sex: 73 y.o., female    PCP: Gretta Comer POUR, NP   Reason for Endocrinology Evaluation: Osteoporosis     Date of Initial Endocrinology Evaluation: 04/02/2022    HPI: Denise Parker is a 73 y.o. female with a past medical history of dyslipidemia, osteoporosis, restless leg syndrome. The patient presented for initial endocrinology clinic visit on 04/02/2022 for consultative assistance with her osteoporosis.   Pt was diagnosed with osteoporosis:2018 with a T-score of -3.4 at the left distal radius    Menarche at age : 29 Menopausal at age : early 80's  Fracture Hx: right wrist - 1990 ( slipped on ice) and in 2012- right tibial (stepped off pavement )   Hx of HRT: yes, short period of time ( few months)  FH of osteoporosis or hip fracture: no Prior Hx of anti-resorptive therapy : was started on Fosamax   in 2018 but discontinued due to  esophageal/chest pain  by 03/2017 Started  Prolia  10/2017 until 07/17/2022   Radiation exposure - none   24-hour urinary calcium  was normal at 173 Mg as well as cortisol 49.8 mcg 03/2022   Received first zoledronic  acid infusion on 12/11/2022, she developed flu like symptoms   SUBJECTIVE:    Today (12/03/23):  Denise Parker is here for follow-up on osteoporosis.   She did receive her first zoledronic  acid infusion on 12/11/2022 She did have flu like symptoms Has mild heartburn issues du to NSAID's due to right knee pain  No constipation or diarrhea  No recent falls  No bone fractures  She does have weighted cuffs but hasn't been  using them consistently   Caltrate/D  600 mg twice daily    HISTORY:  Past Medical History:  Past Medical History:  Diagnosis Date   Acute pain of right knee 07/24/2022   Arthritis    Bronchitis 01/30/2021   Bursitis    back   Chronic gastritis without bleeding 05/30/2019   Cough in adult 01/30/2021   Herpes zoster  04/13/2013   HLD (hyperlipidemia)    HOH (hard of hearing)    Metatarsalgia 09/27/2012   Urinary frequency 11/21/2021   Past Surgical History:  Past Surgical History:  Procedure Laterality Date   BREAST CYST ASPIRATION Left 90s   benign   CATARACT EXTRACTION W/PHACO Left 03/26/2017   Procedure: CATARACT EXTRACTION PHACO AND INTRAOCULAR LENS PLACEMENT (IOC);  Surgeon: Myrna Adine Anes, MD;  Location: [facility] ORS;  Service: Ophthalmology;  Laterality: Left;   fluid pack lot #   [id number] US     00:23.1 AP%  7.5 CDE   1.71    CATARACT EXTRACTION W/PHACO Right 07/30/2017   Procedure: CATARACT EXTRACTION PHACO AND INTRAOCULAR LENS PLACEMENT (IOC);  Surgeon: Myrna Adine Anes, MD;  Location: [facility] ORS;  Service: Ophthalmology;  Laterality: Right;  fluid pack lot #  [id number]  Exp  03/26/2018 US    00:32.1 AP%   5.9 CDE   1.81   COLONOSCOPY WITH PROPOFOL  N/A 09/11/2017   Procedure: COLONOSCOPY WITH PROPOFOL ;  Surgeon: Janalyn Keene NOVAK, MD;  Location: [facility];  Service: Gastroenterology;  Laterality: N/A;   TONSILLECTOMY     TUBAL LIGATION      Social History:  reports that she has never smoked. She has never used smokeless tobacco. She reports current alcohol use of about 21.0 standard drinks of alcohol per week. She reports that she does not use drugs. Family History:  family history includes Breast cancer in her sister; Breast cancer (age of onset: 90) in her sister; Colon cancer (age of onset: 15) in her sister; Heart attack (age of onset: 48) in her brother; Heart disease in her father and mother; Leukemia in her sister; Lupus in her father, sister, and sister; Stroke in her mother.   HOME MEDICATIONS: Allergies as of 12/03/2023       Reactions   Probiotic Product Itching, Swelling   Can't remember the name, it was a women's probiotic supplement. It cause burning, itching and swelling of the ears        Medication List        Accurate as of December 03, 2023 11:23 AM. If you  have any questions, ask your nurse or doctor.          atorvastatin  40 MG tablet Commonly known as: LIPITOR Take 1 tablet (40 mg total) by mouth daily. for cholesterol.   BIOFREEZE EX Apply 1 application topically daily as needed (bursitis in the hip and back pain).   Caltrate 600+D Plus Minerals 600-800 MG-UNIT Tabs Take 2 tablets by mouth.   celecoxib 200 MG capsule Commonly known as: CeleBREX Take 1 capsule (200 mg total) by mouth daily.   cetirizine  10 MG tablet Commonly known as: ZYRTEC  Take 1 tablet (10 mg total) by mouth daily as needed for allergies.   COLLAGEN PO Take by mouth.   CRANBERRY-VITAMIN C PO Take 1 tablet by mouth daily.   fluorometholone 0.1 % ophthalmic suspension Commonly known as: FML Place 1 drop into both eyes daily.   fluticasone  50 MCG/ACT nasal spray Commonly known as: FLONASE  Place 2 sprays into both nostrils daily. As needed for allergies or rhinitis   levocetirizine 5 MG tablet Commonly known as: XYZAL  Take 1 tablet (5 mg total) by mouth every evening. For allergies   MELATONIN PO Take by mouth.   OMEGA 3 500 PO Take 2 capsules by mouth daily at 6 (six) AM.   Turmeric 500 MG Tabs Take 500 mg by mouth 3 (three) times daily.   vitamin E 180 MG (400 UNITS) capsule Take 400 Units by mouth daily.          REVIEW OF SYSTEMS: A comprehensive ROS was conducted with the patient and is negative except as per HPI    OBJECTIVE:  VS: BP 120/70 (BP Location: Left Arm, Patient Position: Sitting, Cuff Size: Large)   Pulse 64   Ht 5' 3.5 (1.613 m)   Wt 198 lb (89.8 kg)   SpO2 96%   BMI 34.52 kg/m    Wt Readings from Last 3 Encounters:  12/03/23 198 lb (89.8 kg)  11/25/23 201 lb 2 oz (91.2 kg)  09/02/23 198 lb (89.8 kg)     EXAM: General: Pt appears well and is in NAD  Neck: General: Supple without adenopathy. Thyroid : Thyroid  size normal.  No goiter or nodules appreciated.   Lungs: Clear with good BS bilat with no  rales, rhonchi, or wheezes  Heart: Auscultation: RRR.  Abdomen: soft, nontender  Extremities:  BL LE: No pretibial edema normal   Mental Status: Judgment, insight: Intact Orientation: Oriented to time, place, and person Mood and affect: No depression, anxiety, or agitation     DATA REVIEWED:  Latest Reference Range & Units 08/21/23 11:18  Sodium 135 - 145 mEq/L 141  Potassium 3.5 - 5.1 mEq/L 4.1  Chloride 96 - 112 mEq/L 104  CO2 19 - 32 mEq/L 30  Glucose 70 -  99 mg/dL 93  BUN 6 - 23 mg/dL 12  Creatinine 9.59 - 8.79 mg/dL 9.31  Calcium  8.4 - 10.5 mg/dL 9.1  Alkaline Phosphatase 39 - 117 U/L 55  Albumin 3.5 - 5.2 g/dL 4.4  AST 0 - 37 U/L 24  ALT 0 - 35 U/L 25  Total Protein 6.0 - 8.3 g/dL 7.1  Total Bilirubin 0.2 - 1.2 mg/dL 0.6  GFR >39.99 mL/min 85.70      DXA 10/28/2022 DualFemur Total Right 10/28/2022 73.7 Normal -0.5 0.950 g/cm2 1.9% - DualFemur Total Right 10/09/2020 71.7 Normal -0.6 0.932 g/cm2 0.8% - DualFemur Total Right 02/08/2019 70.0 Normal -0.7 0.925 g/cm2 2.8% Yes DualFemur Total Right 09/02/2016 67.6 Normal -0.9 0.900 g/cm2 - -   DualFemur Total Mean 10/28/2022 73.7 Normal -0.4 0.963 g/cm2 2.9% Yes DualFemur Total Mean 10/09/2020 71.7 Normal -0.6 0.936 g/cm2 -0.2% - DualFemur Total Mean 02/08/2019 70.0 Normal -0.6 0.938 g/cm2 5.3% Yes DualFemur Total Mean 09/02/2016 67.6 Normal -0.9 0.891 g/cm2 - -   Left Forearm Radius 33% 10/28/2022 73.7 Osteoporosis -4.0 0.529 g/cm2 -7.4% Yes Left Forearm Radius 33% 10/09/2020 71.7 Osteoporosis -3.5 0.571 g/cm2 4.8% - Left Forearm Radius 33% 02/08/2019 70.0 Osteoporosis -3.8 0.545 g/cm2 -6.0% - Left Forearm Radius 33% 09/02/2016 67.6 Osteoporosis -3.4 0.580 g/cm2 - -     ASSESSMENT/PLAN/RECOMMENDATIONS:   Osteoporosis :  -Patient with severe osteoporosis and a  history of two fragility fractures in the past -She did not tolerate alendronate , due to chest pains when she eats or drinks (~esophageal spasms?) -  Prolia  was started 10/2017, until 06/2022 -She received zoledronic  acid 11/2022 -Emphasized the importance of optimizing calcium  and vitamin D  intake -Most recent DXA scan showed improvement in BMD at the hips but not at the distal radius -The distal one third radius BMD was never used as an entry criteria or an outcome measure in any of the currently FDA approved medications including anabolic agents.   F/U 1 yr      Signed electronically by: Stefano Redgie Butts, MD  [facility]  [facility] [address] [place] [place], [place] [address] Phone: [phone] FAX: [phone]   CC: Gretta Comer POUR, NP [address] [place] [address] Phone: [phone] Fax: [phone]   Return to Endocrinology clinic as below: Future Appointments  Date Time Provider Department Center  12/03/2023 11:30 AM Atalie Oros, Donell Redgie, MD [facility] None  09/02/2024  1:00 PM [facility] ANNUAL WELLNESS VISIT 1 [facility] [address]

## 2023-12-03 NOTE — Telephone Encounter (Signed)
 Dr. Sam, patient will be scheduled as soon as possible.  Auth Submission: NO AUTH NEEDED Site of care: Site of care: [facility] Payer: Medicare A/B and [employer] commercial Medication & CPT/J Code(s) submitted: Reclast  (Zolendronic acid) [id number] Diagnosis Code:  Route of submission (phone, fax, portal): portal Phone # Fax # Auth type: Buy/Bill PB Units/visits requested: 5mg  x 1 dose Reference number:  Approval from: 12/03/23 to 02/24/24

## 2023-12-09 ENCOUNTER — Ambulatory Visit: Payer: Self-pay

## 2023-12-09 ENCOUNTER — Ambulatory Visit: Payer: Self-pay | Admitting: Primary Care

## 2023-12-09 NOTE — Telephone Encounter (Addendum)
 Copied from CRM [id number]. Topic: Clinical - Red Word Triage >> Dec 09, 2023  2:22 PM Robinson H wrote: [facility] that prompted transfer to Nurse Triage: Right knee pain, was swollen but iced and doesn't think it has any now. Can't bare weight on leg since this morning, has to use a cane to get around which is new.  See NT encounter today.

## 2023-12-09 NOTE — Telephone Encounter (Signed)
 FYI Only or Action Required?: FYI only for provider.  Patient was last seen in primary care on 11/25/2023 by Watt Mirza, MD.  Called Nurse Triage reporting Knee Pain.  Symptoms began yesterday.  Triage Disposition: See PCP When Office is Open (Within 3 Days)  Patient/caregiver understands and will follow disposition?: Yes               Copied from CRM #[id number]. Topic: Clinical - Red Word Triage >> Dec 09, 2023  2:22 PM Robinson H wrote: [facility] that prompted transfer to Nurse Triage: Right knee pain, was swollen but iced and doesn't think it has any now. Can't bare weight on leg since this morning, has to use a cane to get around which is new. Reason for Disposition  [1] Swollen joint AND [2] no fever or redness  Answer Assessment - Initial Assessment Questions This RN scheduled pt for first available appointment on Friday, 10/17, in office. This RN educated pt on new-worsening symptoms and when to call back. Pt verbalized understanding and agrees to plan.     Right Knee Pain Pt reports ongoing right knee pain. Since seeing Dr. Ubaldo on 10/1, pt has been using an Enso machine and icing the knee daily. Pt is also participating in virtual physical therapy  Pt states the pain had been improving until last night when she noticed aching in the knee at bedtime. She took an anti-inflammatory and was able to sleep approximately 5 hours.  This morning, pt noted stiffness in the knee. She attended a deep-water aerobics class and experienced significant difficulty walking to the pool. During class, the knee felt better, but afterward, pt reported limping when walking to her car. Upon returning home, she iced the knee and used the Enso machine  Pt denies pain while sitting but reports the knee feels as if it will give out when standing or weight-bearing. She is using a cane to ambulate around the house. Pt reports a little bit of swelling in right knee  Pain level: 6/10  with weight-bearing; 0/10 at rest  Protocols used: Knee Pain-A-AH

## 2023-12-10 NOTE — Telephone Encounter (Signed)
 Noted and appreciate Dr. Daphine Deutscher evaluation.

## 2023-12-11 ENCOUNTER — Ambulatory Visit (INDEPENDENT_AMBULATORY_CARE_PROVIDER_SITE_OTHER): Admitting: Family Medicine

## 2023-12-11 ENCOUNTER — Encounter: Payer: Self-pay | Admitting: Family Medicine

## 2023-12-11 VITALS — BP 146/70 | HR 72 | Temp 98.5°F | Ht 63.5 in | Wt 200.0 lb

## 2023-12-11 DIAGNOSIS — M1711 Unilateral primary osteoarthritis, right knee: Secondary | ICD-10-CM | POA: Insufficient documentation

## 2023-12-11 NOTE — Assessment & Plan Note (Addendum)
 Acute, no red flags for infection . Not clearly a complication from recent steroid injection.  Initial improvement likely secondary to numbing agent, unfortunately no improvement with steroid injection. Pain and swelling in the knee is primarily at medial joint line at the area seen on x-ray where osteoarthritis is located.  She is very hesitant about using Celebrex.  She is worried about cardiovascular risk as well as past GI upset. We did discuss in detail how all NSAIDs have possible associated cardiovascular risk if used long-term. I have encouraged her to try topical Voltaren gel up to 4 times a day on her right knee, continue to limit walking, can do water aerobics. We did discuss a short course of Celebrex to help with inflammation if the Voltaren gel not helpful.   Return and ER precautions provided.  If redness and heat in joint or fever she is to go to ER.

## 2023-12-11 NOTE — Patient Instructions (Signed)
 Voltaren gel up to 4 times daily on right knee.  If pain not improving consider benefit vs risk of trial of Celebrex short term.

## 2023-12-11 NOTE — Progress Notes (Signed)
 Patient ID: Denise Parker, female    DOB: 03-02-1950, 73 y.o.   MRN: [id number]  This visit was conducted in person.  BP (!) 160/70   Pulse 72   Temp 98.5 F (36.9 C) (Temporal)   Ht 5' 3.5 (1.613 m)   Wt 200 lb (90.7 kg)   SpO2 94%   BMI 34.87 kg/m    CC:  Chief Complaint  Patient presents with   Knee Pain    Right-Seen by Dr. Watt 11/25/2023-Given Knee injection and prescribed Celebrex.  Patient did not start Celebrex due to concerns about medication    Subjective:   HPI: Denise Parker is a 73 y.o. female  patient of Mallie Gaskins, NP presenting on 12/11/2023 for Knee Pain (Right-Seen by Dr. Watt 11/25/2023-Given Knee injection and prescribed Celebrex.  Patient did not start Celebrex due to concerns about medication)  Right knee pain.  Reviewed office visit note from Dr. Watt regarding similar issue November 25, 2023.   Diagnosed with primary osteoarthritis of right knee.  Flarted up with exercsie change.. longer Plain films showed mild degenerative changes in the patellofemoral compartment as well as minimal degenerative changes in the medial compartment without loss of joint space.  At that point , she was given a knee injection and prescribed Celebrex. She has not yet started Celebrex.    Today she reports pain resolved for a couple of days, then pain has started coming back but not as severe.  Not keeping her up at night.  She has been doing virtual PT. Deep water aerobics. 2 days ago she started having more aching at night... using aleve x 1.  Has noted some pain now with weight bearing, lmping some... this is knee for her.   Iced.  Has noted some increase in swelling, no redness, no heat Using cane.   Relevant past medical, surgical, family and social history reviewed and updated as indicated. Interim medical history since our last visit reviewed. Allergies and medications reviewed and updated. Outpatient Medications Prior to Visit  Medication  Sig Dispense Refill   atorvastatin  (LIPITOR) 40 MG tablet Take 1 tablet (40 mg total) by mouth daily. for cholesterol. 90 tablet 2   Calcium  Carbonate-Vit D-Min (CALTRATE 600+D PLUS MINERALS) 600-800 MG-UNIT TABS Take 2 tablets by mouth.     cetirizine  (ZYRTEC ) 10 MG tablet Take 1 tablet (10 mg total) by mouth daily as needed for allergies. 90 tablet 1   COLLAGEN PO Take by mouth.     CRANBERRY-VITAMIN C PO Take 1 tablet by mouth daily.      fluorometholone (FML) 0.1 % ophthalmic suspension Place 1 drop into both eyes every other day.     fluticasone  (FLONASE ) 50 MCG/ACT nasal spray Place 2 sprays into both nostrils daily. As needed for allergies or rhinitis 48 g 0   levocetirizine (XYZAL ) 5 MG tablet Take 1 tablet (5 mg total) by mouth every evening. For allergies 90 tablet 0   MELATONIN PO Take by mouth.     Menthol, Topical Analgesic, (BIOFREEZE EX) Apply 1 application topically daily as needed (bursitis in the hip and back pain).     naproxen sodium (ALEVE) 220 MG tablet Take 220 mg by mouth daily as needed.     Omega-3 Fatty Acids (OMEGA 3 500 PO) Take 2 capsules by mouth daily at 6 (six) AM.     Turmeric 500 MG TABS Take 500 mg by mouth 3 (three) times daily.     vitamin E  400 UNIT capsule Take 400 Units by mouth daily.     zoledronic  acid (RECLAST ) 5 MG/100ML SOLN injection Inject 5 mg into the vein once.     zoledronic  acid (RECLAST ) 5 MG/100ML SOLN injection Inject 5 mg into the vein once.     celecoxib (CELEBREX) 200 MG capsule Take 1 capsule (200 mg total) by mouth daily. (Patient not taking: Reported on 12/11/2023) 30 capsule 2   No facility-administered medications prior to visit.     Per HPI unless specifically indicated in ROS section below Review of Systems  Constitutional:  Negative for fatigue and fever.  HENT:  Negative for congestion.   Eyes:  Negative for pain.  Respiratory:  Negative for cough and shortness of breath.   Cardiovascular:  Negative for chest pain,  palpitations and leg swelling.  Gastrointestinal:  Negative for abdominal pain.  Genitourinary:  Negative for dysuria and vaginal bleeding.  Musculoskeletal:  Negative for back pain.  Neurological:  Negative for syncope, light-headedness and headaches.  Psychiatric/Behavioral:  Negative for dysphoric mood.    Objective:  BP (!) 160/70   Pulse 72   Temp 98.5 F (36.9 C) (Temporal)   Ht 5' 3.5 (1.613 m)   Wt 200 lb (90.7 kg)   SpO2 94%   BMI 34.87 kg/m   Wt Readings from Last 3 Encounters:  12/11/23 200 lb (90.7 kg)  12/03/23 198 lb (89.8 kg)  11/25/23 201 lb 2 oz (91.2 kg)      Physical Exam Constitutional:      General: She is not in acute distress.    Appearance: Normal appearance. She is well-developed. She is not ill-appearing or toxic-appearing.  HENT:     Head: Normocephalic.     Right Ear: Hearing, tympanic membrane, ear canal and external ear normal. Tympanic membrane is not erythematous, retracted or bulging.     Left Ear: Hearing, tympanic membrane, ear canal and external ear normal. Tympanic membrane is not erythematous, retracted or bulging.     Nose: No mucosal edema or rhinorrhea.     Right Sinus: No maxillary sinus tenderness or frontal sinus tenderness.     Left Sinus: No maxillary sinus tenderness or frontal sinus tenderness.     Mouth/Throat:     Pharynx: Uvula midline.  Eyes:     General: Lids are normal. Lids are everted, no foreign bodies appreciated.     Conjunctiva/sclera: Conjunctivae normal.     Pupils: Pupils are equal, round, and reactive to light.  Neck:     Thyroid : No thyroid  mass or thyromegaly.     Vascular: No carotid bruit.     Trachea: Trachea normal.  Cardiovascular:     Rate and Rhythm: Normal rate and regular rhythm.     Pulses: Normal pulses.     Heart sounds: Normal heart sounds, S1 normal and S2 normal. No murmur heard.    No friction rub. No gallop.  Pulmonary:     Effort: Pulmonary effort is normal. No tachypnea or  respiratory distress.     Breath sounds: Normal breath sounds. No decreased breath sounds, wheezing, rhonchi or rales.  Abdominal:     General: Bowel sounds are normal.     Palpations: Abdomen is soft.     Tenderness: There is no abdominal tenderness.  Musculoskeletal:     Cervical back: Normal range of motion and neck supple.     Right knee: Swelling, effusion, erythema and crepitus present. No bony tenderness. Normal range of motion. Tenderness present over the  medial joint line. No lateral joint line, MCL, LCL, ACL, PCL or patellar tendon tenderness. Normal alignment and normal meniscus.  Skin:    General: Skin is warm and dry.     Findings: No rash.  Neurological:     Mental Status: She is alert.  Psychiatric:        Mood and Affect: Mood is not anxious or depressed.        Speech: Speech normal.        Behavior: Behavior normal. Behavior is cooperative.        Thought Content: Thought content normal.        Judgment: Judgment normal.       Results for orders placed or performed in visit on 08/21/23  CBC   Collection Time: 08/21/23 11:18 AM  Result Value Ref Range   WBC 4.8 4.0 - 10.5 K/uL   RBC 4.87 3.87 - 5.11 Mil/uL   Platelets 226.0 150.0 - 400.0 K/uL   Hemoglobin 13.6 12.0 - 15.0 g/dL   HCT 58.4 63.9 - 53.9 %   MCV 85.2 78.0 - 100.0 fl   MCHC 32.7 30.0 - 36.0 g/dL   RDW 85.4 88.4 - 84.4 %  Lipid panel   Collection Time: 08/21/23 11:18 AM  Result Value Ref Range   Cholesterol 205 (H) 0 - 200 mg/dL   Triglycerides 43.9 0.0 - 149.0 mg/dL   HDL 25.49 >60.99 mg/dL   VLDL 88.7 0.0 - 59.9 mg/dL   LDL Cholesterol 880 (H) 0 - 99 mg/dL   Total CHOL/HDL Ratio 3    NonHDL 130.22   Comprehensive metabolic panel with GFR   Collection Time: 08/21/23 11:18 AM  Result Value Ref Range   Sodium 141 135 - 145 mEq/L   Potassium 4.1 3.5 - 5.1 mEq/L   Chloride 104 96 - 112 mEq/L   CO2 30 19 - 32 mEq/L   Glucose, Bld 93 70 - 99 mg/dL   BUN 12 6 - 23 mg/dL   Creatinine, Ser 9.31  0.40 - 1.20 mg/dL   Total Bilirubin 0.6 0.2 - 1.2 mg/dL   Alkaline Phosphatase 55 39 - 117 U/L   AST 24 0 - 37 U/L   ALT 25 0 - 35 U/L   Total Protein 7.1 6.0 - 8.3 g/dL   Albumin 4.4 3.5 - 5.2 g/dL   GFR 14.29 >39.99 mL/min   Calcium  9.1 8.4 - 10.5 mg/dL  Sedimentation rate   Collection Time: 08/21/23 11:18 AM  Result Value Ref Range   Sed Rate 9 0 - 30 mm/hr  Rheumatoid factor   Collection Time: 08/21/23 11:18 AM  Result Value Ref Range   Rheumatoid fact SerPl-aCnc <10 <14 IU/mL  Cyclic citrul peptide antibody, IgG   Collection Time: 08/21/23 11:18 AM  Result Value Ref Range   Cyclic Citrullin Peptide Ab <83 UNITS    Assessment and Plan  Primary osteoarthritis of right knee Assessment & Plan:  Acute, no red flags for infection . Not clearly a complication from recent steroid injection.  Initial improvement likely secondary to numbing agent, unfortunately no improvement with steroid injection. Pain and swelling in the knee is primarily at medial joint line at the area seen on x-ray where osteoarthritis is located.  She is very hesitant about using Celebrex.  She is worried about cardiovascular risk as well as past GI upset. We did discuss in detail how all NSAIDs have possible associated cardiovascular risk if used long-term. I have encouraged her to try topical  Voltaren gel up to 4 times a day on her right knee, continue to limit walking, can do water aerobics. We did discuss a short course of Celebrex to help with inflammation if the Voltaren gel not helpful.   Return and ER precautions provided.  If redness and heat in joint or fever she is to go to ER.     No follow-ups on file.   Greig Ring, MD

## 2023-12-15 ENCOUNTER — Ambulatory Visit (INDEPENDENT_AMBULATORY_CARE_PROVIDER_SITE_OTHER)

## 2023-12-15 VITALS — BP 104/68 | HR 59 | Temp 97.7°F | Resp 16 | Ht 63.5 in | Wt 198.4 lb

## 2023-12-15 DIAGNOSIS — M810 Age-related osteoporosis without current pathological fracture: Secondary | ICD-10-CM | POA: Diagnosis not present

## 2023-12-15 MED ORDER — DIPHENHYDRAMINE HCL 25 MG PO CAPS
25.0000 mg | ORAL_CAPSULE | Freq: Once | ORAL | Status: AC
  Administered 2023-12-15: 25 mg via ORAL
  Filled 2023-12-15: qty 1

## 2023-12-15 MED ORDER — ACETAMINOPHEN 325 MG PO TABS
650.0000 mg | ORAL_TABLET | Freq: Once | ORAL | Status: AC
  Administered 2023-12-15: 650 mg via ORAL
  Filled 2023-12-15: qty 2

## 2023-12-15 MED ORDER — ZOLEDRONIC ACID 5 MG/100ML IV SOLN
5.0000 mg | Freq: Once | INTRAVENOUS | Status: AC
  Administered 2023-12-15: 5 mg via INTRAVENOUS
  Filled 2023-12-15: qty 100

## 2023-12-15 NOTE — Progress Notes (Signed)
 Diagnosis: Osteoporosis  Provider:  Lonna Coder MD  Procedure: IV Infusion  IV Type: Peripheral, IV Location: R Antecubital  Reclast  (Zolendronic Acid), Dose: 5 mg  Infusion Start Time: 1418  Infusion Stop Time: 1448  Post Infusion IV Care: Patient declined observation and Peripheral IV Discontinued  Discharge: Condition: Good, Destination: Home . AVS Provided  Performed by:  Maximiano JONELLE Pouch, LPN

## 2024-01-18 ENCOUNTER — Ambulatory Visit: Payer: Self-pay

## 2024-01-18 NOTE — Telephone Encounter (Signed)
 FYI Only or Action Required?: FYI only for provider: appointment scheduled on 01/20/24.  Patient was last seen in primary care on 12/11/2023 by Avelina Greig BRAVO, MD.  Called Nurse Triage reporting Leg Pain.  Symptoms began several weeks ago.  Interventions attempted: Prescription medications: Celebrex , Voltaren, Rest, hydration, or home remedies, and Ice/heat application.  Symptoms are: unchanged.  Triage Disposition: See PCP When Office is Open (Within 3 Days)  Patient/caregiver understands and will follow disposition?: Yes Reason for Disposition  [1] MODERATE pain (e.g., interferes with normal activities, limping) AND [2] present > 3 days  Answer Assessment - Initial Assessment Questions Seen sports medicine doctor, given a shot that lasted about a week. Seen PCP 10/17 and was advised to use topical Voltaren as well as celebrex . Not going a day without pain. Icing it AM/PM and heating pad. Denies redness or hot to the touch. Patient states a couple nights last week went to bed chilled and couldn't get warm, but did not check temperature - unsure where thermometer is. Patient states its being looked at as just arthritis but thinks something is wrong and is questioning xrays.   1. ONSET: When did the pain start?      6 weeks ago  2. LOCATION: Where is the pain located?      Right knee and leg  3. PAIN: How bad is the pain?    (Scale 1-10; or mild, moderate, severe)     4/10, worsens at night time. Affecting sleep.  4.  OTHER SYMPTOMS: Do you have any other symptoms? (e.g., chest pain, back pain, breathing difficulty, swelling, rash, fever, numbness, weakness)     Swelling from thigh to ankle mostly from the back.  Protocols used: Leg Pain-A-AH  Copied from CRM [id number]. Topic: Clinical - Red Word Triage >> Jan 18, 2024  8:55 AM Gustabo D wrote: Pt been having trouble with right knee and leg. She's seen the sports medicine doc. She says it's been going on for 6 weeks. Says  it's swollen and and has gotten worse she can hardly walk. Can't sleep says it's terrible. First it was her knee that was swollen now it's the whole leg.

## 2024-01-19 ENCOUNTER — Encounter: Payer: Self-pay | Admitting: Family Medicine

## 2024-01-19 NOTE — Progress Notes (Addendum)
 Wilfrido Luedke T. Raydel Hosick, MD, CAQ Sports Medicine [facility] at [facility] [address] [place], [address]  Phone: [phone]  FAX: [phone]  Denise Parker - 73 y.o. female  MRN [id number]  Date of Birth: Dec 15, 1950  Date: 01/20/2024  PCP: Gretta Comer POUR, NP  Referral: Gretta Comer POUR, NP  Chief Complaint  Patient presents with   Leg Pain    Right-From hip all the way down to bottom of foot   Knee Pain    Right   Subjective:   Denise Parker is a 73 y.o. very pleasant female patient with Body mass index is 34.52 kg/m. who presents with the following:  Discussed the use of AI scribe software for clinical note transcription with the patient, who gave verbal consent to proceed.  Patient presents with ongoing right-sided knee and leg pain.  Last time I saw her, I gave her some Celebrex  and we did an intra-articular knee injection on the right side on November 25, 2023.  She saw my partner Dr. Avelina on December 11, 2023 and had continued knee pain with minimal relief from the intra-articular injection of the knee. - Multiple messages and calls have transpired from the patient, and unfortunately these were sent to various other providers, and I have just been alerted to the current issues.  Radiographs from May 2024 did show some mild degenerative change. History of Present Illness Denise Parker is a 73 year old female who presents with worsening right leg pain, swelling, and knee pain.  She has experienced worsening right leg pain and severe right knee pain, initially thought to be improving, but significantly exacerbated during a two-week vacation when she stopped her regular water aerobics routine. Attempts to restart a walking program increased her pain from a level 2 to 4, leading her to cease walking altogether. By the end of her vacation, she was unable to walk in her house without assistance and required a cane for  mobility.  At baseline, she needs none of these.  The pain is described as excruciating upon waking, necessitating support to move around. It improves slightly with movement during the day but worsens at night, radiating from her hip down to her foot, including the glutes, hamstring, back of the knee, calf, and the bottom of her foot. She experiences fluid retention in the leg at night.  Pain is predominantly medial and anteromedial in the lower leg.  Her sleep is severely disrupted due to pain, with difficulty falling asleep and frequent awakenings. Over the past week, she has only managed fragmented sleep, with one night of five hours of broken sleep. The lack of sleep is contributing to her overall discomfort and pain.  She has been using Voltaren topically and Celebrex  for pain management. Voltaren provided minimal relief, and Celebrex  has been somewhat effective, offering intermittent good days followed by several bad days. She has also been using a cane and shopping carts for support during short walks.  Her medical history includes a leg fracture in 2012.   She recently moved to [place] with her husband.  Review of Systems is noted in the HPI, as appropriate  Objective:   BP 130/80   Pulse 63   Temp 98.1 F (36.7 C) (Temporal)   Ht 5' 3.5 (1.613 m)   Wt 198 lb (89.8 kg)   SpO2 93%   BMI 34.52 kg/m   GEN: No acute distress; alert,appropriate. PULM: Breathing comfortably in no respiratory  distress PSYCH: Normally interactive.   Right knee: Marked pain response to exam Pain with loading the medial lateral patellar facets and with palpating the kneecap Significant tibial plateau, tibial tubercle, and medial proximal tibial pain Significant pain at the medial femoral condyle Extension causes pain, lack of 5 degrees Flexion to 100, significant pain with terminal flexion Stable to varus and valgus stress, ACL and PCL are intact Anterior and posterior drawer testing cause  pain Minimal to trace lower extremity edema Flexion pinch is positive McMurray's is positive  Right hip: Full abduction with the hip flexed to 90 degrees she has normal terminal motion Mild trochanteric bursa tenderness Strength is 3+/5 in all directions at the hip She is neurovascularly intact  No significant pain in the low back and minimal pain in the upper posterior pelvis Mild tenderness in the inferior gluteus and to the lateral and at the trochanteric bursa Straight leg raises negative  Laboratory and Imaging Data:  Assessment and Plan:     ICD-10-CM   1. Insufficiency fracture of right femur with routine healing, subsequent encounter  M84.451D Ambulatory referral to Orthopedic Surgery    2. Acute pain of right knee  M25.561 DG Knee 4 Views W/Patella Right    MR Knee Right Wo Contrast    Ambulatory referral to Orthopedic Surgery    CANCELED: DG Knee 4 Views W/Patella Right    3. Acute right hip pain  M25.551 DG Hip Unilat W OR W/O Pelvis 2-3 Views Right    4. Right lumbosacral radiculopathy  M54.17 DG Lumbar Spine Complete    5. Pain aggravated by walking  R52 MR Knee Right Wo Contrast    6. Acute medial meniscal tear, right, initial encounter  S83.241A Ambulatory referral to Orthopedic Surgery     Assessment & Plan Right knee pain severe, and dramatically worsened since her last office visit 6 weeks ago. At this point, she is having difficulty even bearing weight. Significant pain at the tibial plateau and the medial femoral condyle  Markedly worsened right knee pain with hypersensitivity and swelling. Pain extends from hip to foot, exacerbated at night. Imaging showed mild medial compartmental arthritis progression.  - Differential includes insufficiency fracture or large, bucket-handle meniscal tear. Pain more severe than expected for age and arthritis alone.  Flexion pinch, McMurray's are positive.  Decidedly tender at the tibial plateau, medial femoral condyle,  patella. - Ordered MRI of the right knee to evaluate for insufficiency fracture of the tibial plateau or medial femoral condyle. - Recommended use of a walker to reduce weight-bearing on the knee. - Prescribed tramadol  for pain management. - Prescribed cyclobenzaprine  for nighttime use to aid sleep.  Right leg pain with radicular features Pain radiating from hip to foot, likely secondary to knee pain. No hip joint pathology on imaging. - Address underlying knee pain to alleviate radicular symptoms.  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 No acute fracture. Pain likely exacerbated by knee issues. - Continue to manage knee pain to reduce overall pain burden.  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 Pain management and sleep aids necessary to improve sleep quality. - Prescribed cyclobenzaprine  for nighttime use to aid sleep. - Prescribed tramadol  for pain management to improve sleep quality.  Addendum: 01/25/24 3:26 PM  I reviewed the patient's MRI findings of the right knee with her on the telephone now.  History  is significant for severe osteoporosis.  I think that the primary finding is the marrow edema in the medial femoral condyle, which clinically goes along with probable insufficiency fracture.  She had dramatic worsening of her clinical symptoms fairly abruptly, and I think this is the most likely cause of her more severe pain.  I think she also has a very significant medial meniscal tear.  Baker's cyst.  I am going to have the patient start using a walker to take weight off of her knee.  With the complexity of the case, I am going to consult orthopedic surgery for their opinion.  Medication Management during today's office visit: Meds ordered this encounter  Medications   cyclobenzaprine  (FLEXERIL ) 10 MG tablet     Sig: Take 1 tablet (10 mg total) by mouth at bedtime as needed for muscle spasms.    Dispense:  30 tablet    Refill:  1   traMADol  (ULTRAM ) 50 MG tablet    Sig: Take 1 tablet (50 mg total) by mouth every 8 (eight) hours as needed for moderate pain (pain score 4-6).    Dispense:  20 tablet    Refill:  0   There are no discontinued medications.  Orders placed today for conditions managed today: Orders Placed This Encounter  Procedures   DG Knee 4 Views W/Patella Right   DG Hip Unilat W OR W/O Pelvis 2-3 Views Right   DG Lumbar Spine Complete   MR Knee Right Wo Contrast   Ambulatory referral to Orthopedic Surgery    Disposition: No follow-ups on file.  [employer] speech-to-text software was used for transcription in this dictation.  Possible transcriptional errors can occur using Animal nutritionist.   Signed,  Jacques DASEN. Angeline Trick, MD   Outpatient Encounter Medications as of 01/20/2024  Medication Sig   atorvastatin  (LIPITOR) 40 MG tablet Take 1 tablet (40 mg total) by mouth daily. for cholesterol.   Calcium  Carbonate-Vit D-Min (CALTRATE 600+D PLUS MINERALS) 600-800 MG-UNIT TABS Take 2 tablets by mouth.   celecoxib  (CELEBREX ) 200 MG capsule Take 1 capsule (200 mg total) by mouth daily.   cetirizine  (ZYRTEC ) 10 MG tablet Take 1 tablet (10 mg total) by mouth daily as needed for allergies.   COLLAGEN PO Take by mouth.   CRANBERRY-VITAMIN C PO Take 1 tablet by mouth daily.    cyclobenzaprine  (FLEXERIL ) 10 MG tablet Take 1 tablet (10 mg total) by mouth at bedtime as needed for muscle spasms.   diclofenac Sodium (VOLTAREN) 1 % GEL Apply 2 g topically 4 (four) times daily.   fluorometholone (FML) 0.1 % ophthalmic suspension Place 1 drop into both eyes every other day.   fluticasone  (FLONASE ) 50 MCG/ACT nasal spray Place 2 sprays into both nostrils daily. As needed for allergies or rhinitis   levocetirizine (XYZAL ) 5 MG tablet Take 1 tablet (5 mg total) by mouth every evening. For  allergies   MELATONIN PO Take by mouth.   Menthol, Topical Analgesic, (BIOFREEZE EX) Apply 1 application topically daily as needed (bursitis in the hip and back pain).   naproxen sodium (ALEVE) 220 MG tablet Take 220 mg by mouth daily as needed.   Omega-3 Fatty Acids (OMEGA 3 500 PO) Take 2 capsules by mouth daily at 6 (six) AM.   traMADol  (ULTRAM ) 50 MG tablet Take 1 tablet (50 mg total) by mouth every 8 (eight) hours as needed for moderate pain (pain score 4-6).   Turmeric 500 MG TABS Take 500 mg by mouth 3 (  three) times daily.   vitamin E 400 UNIT capsule Take 400 Units by mouth daily.   zoledronic  acid (RECLAST ) 5 MG/100ML SOLN injection Inject 5 mg into the vein once.   No facility-administered encounter medications on file as of 01/20/2024.

## 2024-01-20 ENCOUNTER — Encounter: Payer: Self-pay | Admitting: Family Medicine

## 2024-01-20 ENCOUNTER — Ambulatory Visit
Admission: RE | Admit: 2024-01-20 | Discharge: 2024-01-20 | Disposition: A | Source: Ambulatory Visit | Attending: Family Medicine | Admitting: Family Medicine

## 2024-01-20 ENCOUNTER — Ambulatory Visit: Admitting: Family Medicine

## 2024-01-20 VITALS — BP 130/80 | HR 63 | Temp 98.1°F | Ht 63.5 in | Wt 198.0 lb

## 2024-01-20 DIAGNOSIS — M84451D Pathological fracture, right femur, subsequent encounter for fracture with routine healing: Secondary | ICD-10-CM | POA: Diagnosis not present

## 2024-01-20 DIAGNOSIS — R52 Pain, unspecified: Secondary | ICD-10-CM

## 2024-01-20 DIAGNOSIS — M25551 Pain in right hip: Secondary | ICD-10-CM

## 2024-01-20 DIAGNOSIS — M25561 Pain in right knee: Secondary | ICD-10-CM | POA: Diagnosis not present

## 2024-01-20 DIAGNOSIS — K5900 Constipation, unspecified: Secondary | ICD-10-CM | POA: Diagnosis not present

## 2024-01-20 DIAGNOSIS — M25461 Effusion, right knee: Secondary | ICD-10-CM | POA: Diagnosis not present

## 2024-01-20 DIAGNOSIS — S83241A Other tear of medial meniscus, current injury, right knee, initial encounter: Secondary | ICD-10-CM

## 2024-01-20 DIAGNOSIS — M5417 Radiculopathy, lumbosacral region: Secondary | ICD-10-CM

## 2024-01-20 DIAGNOSIS — M47816 Spondylosis without myelopathy or radiculopathy, lumbar region: Secondary | ICD-10-CM | POA: Diagnosis not present

## 2024-01-20 DIAGNOSIS — M1711 Unilateral primary osteoarthritis, right knee: Secondary | ICD-10-CM | POA: Diagnosis not present

## 2024-01-20 DIAGNOSIS — M4726 Other spondylosis with radiculopathy, lumbar region: Secondary | ICD-10-CM | POA: Diagnosis not present

## 2024-01-20 MED ORDER — TRAMADOL HCL 50 MG PO TABS: 50.0000 mg | ORAL_TABLET | Freq: Three times a day (TID) | ORAL | 0 refills | Status: AC | PRN

## 2024-01-20 MED ORDER — CYCLOBENZAPRINE HCL 10 MG PO TABS: 10.0000 mg | ORAL_TABLET | Freq: Every evening | ORAL | 1 refills | Status: AC | PRN

## 2024-01-21 ENCOUNTER — Ambulatory Visit: Payer: Self-pay | Admitting: Family Medicine

## 2024-01-23 ENCOUNTER — Ambulatory Visit
Admission: RE | Admit: 2024-01-23 | Discharge: 2024-01-23 | Disposition: A | Discharge status: Home / Self Care | Source: Ambulatory Visit | Attending: Family Medicine | Admitting: Family Medicine

## 2024-01-23 DIAGNOSIS — M1711 Unilateral primary osteoarthritis, right knee: Secondary | ICD-10-CM | POA: Diagnosis not present

## 2024-01-23 DIAGNOSIS — R52 Pain, unspecified: Secondary | ICD-10-CM

## 2024-01-23 DIAGNOSIS — M25561 Pain in right knee: Secondary | ICD-10-CM

## 2024-01-25 ENCOUNTER — Telehealth: Payer: Self-pay | Admitting: Family Medicine

## 2024-01-25 NOTE — Telephone Encounter (Signed)
 I wanted to alert you about this referral.  The patient has a medial femoral condyle insufficiency fracture, and I consulted [facility] in [place].  This is not an emergency, but it needs to be handled in a timely manner.  I have asked her to use a walker for now to unload the knee.   MR Knee Right Wo Contrast Result Date: 01/23/2024 CLINICAL DATA:  Severe acute knee pain with minimal weight-bearing ability. Evaluate for insufficiency fracture. EXAM: MRI OF THE RIGHT KNEE WITHOUT CONTRAST TECHNIQUE: Multiplanar, multisequence MR imaging of the knee was performed. No intravenous contrast was administered. COMPARISON:  Radiographs 01/20/2024 and 07/24/2022. No previous MRI available. FINDINGS: MENISCI Medial meniscus: The medial meniscus is moderately extruded peripherally from the joint space and demonstrates diffuse free edge tearing of its posterior horn and body. There is a possible small flap fragment within the meniscotibial recess, best seen on the coronal images. The meniscal root is intact, and no centrally displaced meniscal fragments are identified. Lateral meniscus: Prominent intrasubstance degenerative signal with a possible nondisplaced horizontal tear involving the body and anterior horn. LIGAMENTS Cruciates: The anterior and posterior cruciate ligaments are intact. Collaterals: The medial and lateral collateral ligament complexes are intact. There is medial buckling of the MCL related to the medial meniscal extrusion. A moderate amount of fluid is present in the pes anserine bursa. CARTILAGE Patellofemoral:  Mild chondral thinning.  No focal defect. Medial: Moderate chondral thinning and surface irregularity with and osteochondral lesion of the medial femoral condyle peripherally with associated surrounding marrow edema. Mild reactive changes peripherally in the medial tibial plateau. Lateral:  Mild chondral thinning.  No focal defect. MISCELLANEOUS Joint:  Small mildly complex knee  joint effusion. Popliteal Fossa: The popliteus muscle and tendon are intact. Moderate-sized complex Baker's cyst. Extensor Mechanism: The visualized quadriceps and patellar tendons are intact.The patellar retinacula and medial patellofemoral ligament are intact. Bones: As above, marrow changes within the medial femoral condyle which could reflect a subchondral insufficiency fracture or reactive marrow edema. No other acute osseous findings. Other: No other significant periarticular soft tissue findings. IMPRESSION: 1. Diffuse free edge tearing of the posterior horn and body of the medial meniscus with possible small flap fragment in the meniscotibial recess. 2. Prominent intrasubstance degenerative signal within the lateral meniscus with a possible nondisplaced horizontal tear involving the body and anterior horn. 3. 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 This could reflect a subchondral insufficiency fracture or reactive marrow edema. 4. Small knee joint effusion and moderate-sized complex Baker's cyst. 5. Pes anserine bursitis. Electronically Signed   By: Elsie Perone M.D.   On: 01/23/2024 16:37

## 2024-01-25 NOTE — Addendum Note (Signed)
 Addended by: WATT MIRZA on: 01/25/2024 03:27 PM   Modules accepted: Orders

## 2024-01-26 ENCOUNTER — Telehealth: Payer: Self-pay | Admitting: Family Medicine

## 2024-01-26 NOTE — Telephone Encounter (Signed)
 I called the patient.  No answer

## 2024-01-26 NOTE — Telephone Encounter (Signed)
 Copied from CRM #[id number]. Topic: Clinical - Lab/Test Results >> Jan 25, 2024  3:56 PM Rosina BIRCH wrote: Reason for CRM: patient called stating that MD Copland called her regarding her MRI results and she has questions to ask him or the clinical team [phone]

## 2024-01-26 NOTE — Telephone Encounter (Signed)
 I reviewed all MRI findings again with the patient.  Primary findings are probable medial femoral condyle insufficiency fracture, stable, and meniscal tearing with possible flap.  We also reviewed the patient's arthritis, bursitis, and Baker's cyst.  She is already using a walker, and she will need to do this until the knee heals.  With the complexity of the case, 73 year old with osteoporosis, multiple findings, I am going to consult orthopedic surgery for their opinion.

## 2024-01-29 ENCOUNTER — Telehealth: Payer: Self-pay | Admitting: Primary Care

## 2024-01-29 NOTE — Telephone Encounter (Signed)
 Can we check on patients referral please

## 2024-01-29 NOTE — Telephone Encounter (Signed)
 Copied from CRM [id number]. Topic: Referral - Status >> Jan 29, 2024 10:25 AM Dedra B wrote: Reason for CRM: Pt was referred to [facility] in [place]. Dr. Watt told her to let him know if she hasn't heard from the [facility] by today. Pt said she has not heard from them.

## 2024-02-01 ENCOUNTER — Encounter: Payer: Self-pay | Admitting: *Deleted

## 2024-02-01 NOTE — Telephone Encounter (Signed)
See referral notes for updates

## 2024-02-01 NOTE — Telephone Encounter (Signed)
 Referral faxed to [facility]. They will review the referral and contact the patient to schedule.   MyChart message sent to patient with contact information. MyChart letter sent as well making aware of where to call and if they do not hear from [facility].

## 2024-02-02 ENCOUNTER — Telehealth: Payer: Self-pay | Admitting: *Deleted

## 2024-02-02 NOTE — Telephone Encounter (Signed)
 Copied from CRM [id number]. Topic: General - Other >> Feb 02, 2024 10:51 AM Denise Parker wrote: Reason for [name] called stating she need a copy of her MRI to take with her to [facility] on 12/15. Patient stated it need to be on a CD    ----------------------------------------------------------------------- From previous Reason for Contact - Appt Info/Confirm: Patient/patient representative is calling for information regarding an appointment.    Copy of Mri to take with her to the [facility]

## 2024-02-02 NOTE — Telephone Encounter (Signed)
 Spoke with pt and advised her that she would need to contact the location she had the MRI done at to get a copy of this. Pt has been given the information for [facility].

## 2024-02-21 ENCOUNTER — Other Ambulatory Visit: Payer: Self-pay | Admitting: Family Medicine

## 2024-02-22 NOTE — Telephone Encounter (Signed)
 Last office visit 01/20/24 for Leg/Knee Pain with Dr. Watt.   Last refilled 11/25/2023 for #30 with 2 refills.  Next appt: No future appointments with Copland or [facility].

## 2024-03-28 ENCOUNTER — Other Ambulatory Visit: Payer: Self-pay | Admitting: Orthopedic Surgery

## 2024-03-28 ENCOUNTER — Ambulatory Visit
Admission: RE | Admit: 2024-03-28 | Discharge: 2024-03-28 | Disposition: A | Source: Ambulatory Visit | Attending: Orthopedic Surgery

## 2024-03-28 DIAGNOSIS — R2241 Localized swelling, mass and lump, right lower limb: Secondary | ICD-10-CM

## 2024-09-02 ENCOUNTER — Ambulatory Visit

## 2024-12-02 ENCOUNTER — Ambulatory Visit: Admitting: Internal Medicine
